# Patient Record
Sex: Male | Born: 1975
Health system: Southern US, Community
[De-identification: ages and names within clinical notes are randomized; demographics above are authoritative.]

## PROBLEM LIST (undated history)

## (undated) DIAGNOSIS — G4730 Sleep apnea, unspecified: Secondary | ICD-10-CM

## (undated) DIAGNOSIS — J189 Pneumonia, unspecified organism: Secondary | ICD-10-CM

## (undated) DIAGNOSIS — Z8719 Personal history of other diseases of the digestive system: Secondary | ICD-10-CM

## (undated) DIAGNOSIS — F32A Depression, unspecified: Secondary | ICD-10-CM

## (undated) DIAGNOSIS — F4310 Post-traumatic stress disorder, unspecified: Secondary | ICD-10-CM

## (undated) DIAGNOSIS — I10 Essential (primary) hypertension: Secondary | ICD-10-CM

## (undated) DIAGNOSIS — F329 Major depressive disorder, single episode, unspecified: Secondary | ICD-10-CM

## (undated) DIAGNOSIS — M4316 Spondylolisthesis, lumbar region: Secondary | ICD-10-CM

## (undated) DIAGNOSIS — A770 Spotted fever due to Rickettsia rickettsii: Secondary | ICD-10-CM

## (undated) DIAGNOSIS — Z87442 Personal history of urinary calculi: Secondary | ICD-10-CM

## (undated) DIAGNOSIS — J45909 Unspecified asthma, uncomplicated: Secondary | ICD-10-CM

## (undated) DIAGNOSIS — M1990 Unspecified osteoarthritis, unspecified site: Secondary | ICD-10-CM

## (undated) HISTORY — PX: HERNIA REPAIR: SHX51

## (undated) HISTORY — PX: SPLENECTOMY, TOTAL: SHX788

## (undated) HISTORY — PX: OTHER SURGICAL HISTORY: SHX169

## (undated) HISTORY — PX: MANDIBLE SURGERY: SHX707

---

## 2004-12-07 ENCOUNTER — Emergency Department (HOSPITAL_COMMUNITY): Admission: EM | Admit: 2004-12-07 | Discharge: 2004-12-07 | Payer: Self-pay | Admitting: *Deleted

## 2008-10-06 ENCOUNTER — Emergency Department (HOSPITAL_COMMUNITY): Admission: EM | Admit: 2008-10-06 | Discharge: 2008-10-06 | Payer: Self-pay | Admitting: Emergency Medicine

## 2009-05-06 ENCOUNTER — Ambulatory Visit: Payer: Self-pay | Admitting: Cardiology

## 2010-05-06 ENCOUNTER — Emergency Department (HOSPITAL_COMMUNITY): Admission: EM | Admit: 2010-05-06 | Discharge: 2010-05-06 | Payer: Self-pay | Admitting: Emergency Medicine

## 2010-06-05 ENCOUNTER — Emergency Department (HOSPITAL_COMMUNITY)
Admission: EM | Admit: 2010-06-05 | Discharge: 2010-06-06 | Payer: Self-pay | Source: Home / Self Care | Admitting: Emergency Medicine

## 2010-08-29 ENCOUNTER — Emergency Department (HOSPITAL_COMMUNITY): Payer: Self-pay

## 2010-08-29 ENCOUNTER — Emergency Department (HOSPITAL_COMMUNITY)
Admission: EM | Admit: 2010-08-29 | Discharge: 2010-08-29 | Disposition: A | Discharge status: Home / Self Care | Payer: Self-pay | Attending: Emergency Medicine | Admitting: Emergency Medicine

## 2010-08-29 DIAGNOSIS — J40 Bronchitis, not specified as acute or chronic: Secondary | ICD-10-CM | POA: Insufficient documentation

## 2010-08-29 DIAGNOSIS — R079 Chest pain, unspecified: Secondary | ICD-10-CM | POA: Insufficient documentation

## 2010-08-29 LAB — POCT CARDIAC MARKERS
CKMB, poc: <1 ng/mL — ABNORMAL LOW (ref 1.0–8.0)
Troponin i, poc: <0.05 ng/mL (ref 0.00–0.09)
Troponin i, poc: <0.05 ng/mL (ref 0.00–0.09)

## 2010-08-29 LAB — CBC
Hemoglobin: 15.5 g/dL (ref 13.0–17.0)
MCH: 29 pg (ref 26.0–34.0)
MCV: 81.7 fL (ref 78.0–100.0)
Platelets: 406 10*3/uL — ABNORMAL HIGH (ref 150–400)
RBC: 5.35 MIL/uL (ref 4.22–5.81)
WBC: 15.2 10*3/uL — ABNORMAL HIGH (ref 4.0–10.5)

## 2010-08-29 LAB — BASIC METABOLIC PANEL
CO2: 28 mEq/L (ref 19–32)
Chloride: 103 mEq/L (ref 96–112)
GFR calc Af Amer: >60 mL/min (ref >60)
Potassium: 3.8 mEq/L (ref 3.5–5.1)
Sodium: 140 mEq/L (ref 135–145)

## 2010-08-29 LAB — DIFFERENTIAL
Basophils Absolute: 0.2 10*3/uL — ABNORMAL HIGH (ref 0.0–0.1)
Basophils Relative: 1 % (ref 0–1)
Lymphocytes Relative: 49 % — ABNORMAL HIGH (ref 12–46)
Monocytes Relative: 8 % (ref 3–12)
Neutro Abs: 5.5 10*3/uL (ref 1.7–7.7)
Neutrophils Relative %: 36 % — ABNORMAL LOW (ref 43–77)

## 2010-08-29 LAB — D-DIMER, QUANTITATIVE: D-Dimer, Quant: 0.26 ug/mL-FEU (ref 0.00–0.48)

## 2010-09-09 LAB — DIFFERENTIAL
Basophils Absolute: 0.2 10*3/uL — ABNORMAL HIGH (ref 0.0–0.1)
Basophils Relative: 1 % (ref 0–1)
Lymphocytes Relative: 44 % (ref 12–46)
Neutro Abs: 5.7 10*3/uL (ref 1.7–7.7)
Neutrophils Relative %: 40 % — ABNORMAL LOW (ref 43–77)

## 2010-09-09 LAB — BASIC METABOLIC PANEL WITH GFR
BUN: 11 mg/dL (ref 6–23)
CO2: 28 meq/L (ref 19–32)
Calcium: 9.2 mg/dL (ref 8.4–10.5)
Chloride: 102 meq/L (ref 96–112)
Creatinine, Ser: 0.84 mg/dL (ref 0.4–1.5)
GFR calc non Af Amer: >60 mL/min
Glucose, Bld: 107 mg/dL — ABNORMAL HIGH (ref 70–99)
Potassium: 3.8 meq/L (ref 3.5–5.1)
Sodium: 139 meq/L (ref 135–145)

## 2010-09-09 LAB — CBC
HCT: 42.6 % (ref 39.0–52.0)
Hemoglobin: 15.9 g/dL (ref 13.0–17.0)
MCH: 29.8 pg (ref 26.0–34.0)
MCHC: 37.3 g/dL — ABNORMAL HIGH (ref 30.0–36.0)
MCV: 79.8 fL (ref 78.0–100.0)
Platelets: 369 K/uL (ref 150–400)
RBC: 5.34 MIL/uL (ref 4.22–5.81)
RDW: 13.8 % (ref 11.5–15.5)
WBC: 14.1 K/uL — ABNORMAL HIGH (ref 4.0–10.5)

## 2010-09-09 LAB — POCT CARDIAC MARKERS
CKMB, poc: <1 ng/mL — ABNORMAL LOW (ref 1.0–8.0)
CKMB, poc: <1 ng/mL — ABNORMAL LOW (ref 1.0–8.0)
Myoglobin, poc: 41.7 ng/mL (ref 12–200)
Myoglobin, poc: 62.2 ng/mL (ref 12–200)
Troponin i, poc: <0.05 ng/mL (ref 0.00–0.09)
Troponin i, poc: <0.05 ng/mL (ref 0.00–0.09)

## 2010-09-10 LAB — DIFFERENTIAL
Basophils Absolute: 0.2 10*3/uL — ABNORMAL HIGH (ref 0.0–0.1)
Eosinophils Absolute: 1 10*3/uL — ABNORMAL HIGH (ref 0.0–0.7)
Lymphocytes Relative: 50 % — ABNORMAL HIGH (ref 12–46)
Lymphs Abs: 8.5 10*3/uL — ABNORMAL HIGH (ref 0.7–4.0)
Neutro Abs: 5.6 10*3/uL (ref 1.7–7.7)

## 2010-09-10 LAB — CBC
HCT: 47.4 % (ref 39.0–52.0)
Hemoglobin: 15.9 g/dL (ref 13.0–17.0)
MCH: 28.9 pg (ref 26.0–34.0)
MCHC: 33.5 g/dL (ref 30.0–36.0)
MCV: 86.5 fL (ref 78.0–100.0)
RBC: 5.48 MIL/uL (ref 4.22–5.81)

## 2010-09-10 LAB — POCT CARDIAC MARKERS
Myoglobin, poc: 34.8 ng/mL (ref 12–200)
Troponin i, poc: <0.05 ng/mL (ref 0.00–0.09)

## 2010-09-10 LAB — COMPREHENSIVE METABOLIC PANEL
AST: 39 U/L — ABNORMAL HIGH (ref 0–37)
CO2: 25 mEq/L (ref 19–32)
Chloride: 104 mEq/L (ref 96–112)
Creatinine, Ser: 0.8 mg/dL (ref 0.4–1.5)
GFR calc Af Amer: >60 mL/min (ref >60)
GFR calc non Af Amer: >60 mL/min (ref >60)
Glucose, Bld: 138 mg/dL — ABNORMAL HIGH (ref 70–99)
Total Bilirubin: 0.5 mg/dL (ref 0.3–1.2)

## 2012-08-26 ENCOUNTER — Emergency Department (HOSPITAL_COMMUNITY): Payer: 59

## 2012-08-26 ENCOUNTER — Emergency Department (HOSPITAL_COMMUNITY)
Admission: EM | Admit: 2012-08-26 | Discharge: 2012-08-26 | Disposition: A | Discharge status: Home / Self Care | Payer: 59 | Attending: Emergency Medicine | Admitting: Emergency Medicine

## 2012-08-26 ENCOUNTER — Encounter (HOSPITAL_COMMUNITY): Payer: Self-pay | Admitting: Emergency Medicine

## 2012-08-26 DIAGNOSIS — R110 Nausea: Secondary | ICD-10-CM | POA: Insufficient documentation

## 2012-08-26 DIAGNOSIS — N200 Calculus of kidney: Secondary | ICD-10-CM

## 2012-08-26 DIAGNOSIS — N509 Disorder of male genital organs, unspecified: Secondary | ICD-10-CM | POA: Insufficient documentation

## 2012-08-26 LAB — URINALYSIS, ROUTINE W REFLEX MICROSCOPIC
Bilirubin Urine: NEGATIVE
Nitrite: NEGATIVE
Specific Gravity, Urine: 1.01 (ref 1.005–1.030)
pH: 6 (ref 5.0–8.0)

## 2012-08-26 LAB — URINE MICROSCOPIC-ADD ON

## 2012-08-26 MED ORDER — MORPHINE SULFATE 4 MG/ML IJ SOLN
6.0000 mg | Freq: Once | INTRAMUSCULAR | Status: AC
  Administered 2012-08-26: 6 mg via INTRAVENOUS
  Filled 2012-08-26: qty 2

## 2012-08-26 MED ORDER — KETOROLAC TROMETHAMINE 30 MG/ML IJ SOLN
30.0000 mg | Freq: Once | INTRAMUSCULAR | Status: AC
  Administered 2012-08-26: 30 mg via INTRAVENOUS
  Filled 2012-08-26: qty 1

## 2012-08-26 MED ORDER — HYDROCODONE-ACETAMINOPHEN 5-325 MG PO TABS: 1.0000 | ORAL_TABLET | ORAL | Status: DC | PRN

## 2012-08-26 NOTE — ED Notes (Signed)
Pt c/o rt testicle pain and lower abd pain. Pt denies any injury.

## 2012-08-26 NOTE — ED Notes (Signed)
Pt states a sharp pain in his right testicle woke him up at roughly 2300. The pain has since moved to his lower abdomen. Also c/o nausea. Reports taking 400 mg of Ibuprofen with no relief. States he was helping someone move a day or so ago. Denies any trauma or injury. No other complaints at this time.

## 2012-08-26 NOTE — ED Notes (Signed)
Pt states he feels as though he passed a kidney stone when he voided.

## 2012-08-26 NOTE — ED Provider Notes (Signed)
History     CSN: [id number]  Arrival date & time 08/26/12  0054   First MD Initiated Contact with Patient 08/26/12 0131      Chief Complaint  Patient presents with  . Abdominal Pain  . Groin Pain     The history is provided by the patient.  patient reports acute onset right testicular groin pain.  Nausea without vomiting.  No prior history kidney stones.  He denies radiation of this pain up to his right flank.  He reports the pain radiates from his right testicle into his right groin.  No prior history of hernia.  No urinary symptoms.  No testicular tenderness.  No fevers or chills.  No diarrhea.  Symptoms are moderate in severity.  Nothing worsens or improves his symptoms.  He's never had pain like this before  History reviewed. No pertinent past medical history.  Past Surgical History  Procedure Laterality Date  . Hernia repair    . Splenectomy, total      History reviewed. No pertinent family history.  History  Substance Use Topics  . Smoking status: Never Smoker   . Smokeless tobacco: Not on file  . Alcohol Use: Yes      Review of Systems  Gastrointestinal: Positive for abdominal pain.  All other systems reviewed and are negative.    Allergies  Review of patient's allergies indicates no known allergies.  Home Medications   Current Outpatient Rx  Name  Route  Sig  Dispense  Refill  . acetaminophen (TYLENOL) 500 MG tablet   Oral   Take 500 mg by mouth every 6 (six) hours as needed for pain.         Marland Kitchen HYDROcodone-acetaminophen (NORCO/VICODIN) 5-325 MG per tablet   Oral   Take 1 tablet by mouth every 4 (four) hours as needed for pain.   15 tablet   0     BP 130/88  Pulse 77  Temp(Src) 97.7 F (36.5 C) (Oral)  Resp 22  Ht 5\' 9"  (1.753 m)  Wt 330 lb (149.687 kg)  BMI 48.71 kg/m2  SpO2 97%  Physical Exam  Nursing note and vitals reviewed. Constitutional: He is oriented to person, place, and time. He appears well-developed and well-nourished.   HENT:  Head: Normocephalic and atraumatic.  Eyes: EOM are normal.  Neck: Normal range of motion.  Cardiovascular: Normal rate, regular rhythm, normal heart sounds and intact distal pulses.   Pulmonary/Chest: Effort normal and breath sounds normal. No respiratory distress.  Abdominal: Soft. He exhibits no distension. There is no tenderness.  Genitourinary:  Normal penis.  No penile tenderness.  Normal scrotum.  Normal testicles bilaterally.  No testicular tenderness.  Musculoskeletal: Normal range of motion.  Neurological: He is alert and oriented to person, place, and time.  Skin: Skin is warm and dry.  Psychiatric: He has a normal mood and affect. Judgment normal.    ED Course  Procedures (including critical care time)  Labs Reviewed  URINALYSIS, ROUTINE W REFLEX MICROSCOPIC - Abnormal; Notable for the following:    Hgb urine dipstick MODERATE (*)    Urobilinogen, UA 2.0 (*)    All other components within normal limits  URINE MICROSCOPIC-ADD ON   Ct Abdomen Pelvis Wo Contrast  08/26/2012  *RADIOLOGY REPORT*  Clinical Data: Right testicular pain and lower abdominal pain.  CT ABDOMEN AND PELVIS WITHOUT CONTRAST  Technique:  Multidetector CT imaging of the abdomen and pelvis was performed following the standard protocol without intravenous contrast.  Comparison:  None.  Findings: The lung bases are clear.  The right kidney is rotated and located inferiorly consistent with mild ectopia.  The kidneys are otherwise symmetrical in size and shape.  There is no pyelocaliectasis or ureterectasis.  No renal or ureteral stones are demonstrated.  There is a calcification at the base of the bladder which may be represent a stone in the prostatic urethra.  No bladder wall thickening.  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  Mesh placement over an anterior abdominal wall hernia.  There is mild residual bulging of the hernia containing transverse colon.  There is no  proximal obstruction.  The unenhanced appearance of the liver, pancreas, gallbladder, adrenal glands, abdominal aorta, and retroperitoneal lymph nodes is unremarkable.  The stomach, small bowel, and colon are decompressed.  Stool fills the colon. No free air or free fluid in the abdomen. Prominent visceral adipose tissues.  Pelvis:  Prostate gland is not enlarged.  No free or loculated pelvic fluid collections.  No diverticulitis.  Normal appendix.  No abnormal pelvic lymphadenopathy.  Normal alignment of the lumbar vertebrae.  IMPRESSION: No renal or ureteral stone or obstruction.  There is a calcification in the base of the bladder at the level of the prostate gland which could represent a prostatic ureteral stone. Prior postoperative changes with mesh in the anterior abdominal wall.  There is residual bulging of the anterior abdominal wall containing transverse colon without obstruction.  Previous splenectomy with two accessory spleens present.   Original Report Authenticated By: Burman Nieves, M.D.    I personally reviewed the imaging tests through PACS system I reviewed available ER/hospitalization records through the EMR   1. Kidney stone       MDM  3:40 AM The patient feels much better at this time.  He states when he gave his urine sample he noted a small stone in his urine.  This is consistent with now passed urethral stone. Home with urology follow        Lyanne Co, MD 08/26/12 [phone]

## 2013-07-18 ENCOUNTER — Emergency Department (HOSPITAL_COMMUNITY): Payer: 59

## 2013-07-18 ENCOUNTER — Emergency Department (HOSPITAL_COMMUNITY)
Admission: EM | Admit: 2013-07-18 | Discharge: 2013-07-18 | Disposition: A | Discharge status: Home / Self Care | Payer: 59 | Attending: Emergency Medicine | Admitting: Emergency Medicine

## 2013-07-18 ENCOUNTER — Encounter (HOSPITAL_COMMUNITY): Payer: Self-pay | Admitting: Emergency Medicine

## 2013-07-18 DIAGNOSIS — M542 Cervicalgia: Secondary | ICD-10-CM | POA: Insufficient documentation

## 2013-07-18 DIAGNOSIS — R000 Tachycardia, unspecified: Secondary | ICD-10-CM | POA: Insufficient documentation

## 2013-07-18 DIAGNOSIS — B9789 Other viral agents as the cause of diseases classified elsewhere: Secondary | ICD-10-CM | POA: Insufficient documentation

## 2013-07-18 DIAGNOSIS — J029 Acute pharyngitis, unspecified: Secondary | ICD-10-CM | POA: Insufficient documentation

## 2013-07-18 DIAGNOSIS — M549 Dorsalgia, unspecified: Secondary | ICD-10-CM | POA: Insufficient documentation

## 2013-07-18 DIAGNOSIS — F17200 Nicotine dependence, unspecified, uncomplicated: Secondary | ICD-10-CM | POA: Insufficient documentation

## 2013-07-18 DIAGNOSIS — Z8659 Personal history of other mental and behavioral disorders: Secondary | ICD-10-CM | POA: Insufficient documentation

## 2013-07-18 DIAGNOSIS — B349 Viral infection, unspecified: Secondary | ICD-10-CM

## 2013-07-18 HISTORY — DX: Post-traumatic stress disorder, unspecified: F43.10

## 2013-07-18 LAB — CBC WITH DIFFERENTIAL/PLATELET
BASOS ABS: 0.1 10*3/uL (ref 0.0–0.1)
BASOS PCT: 1 % (ref 0–1)
EOS ABS: 0.8 10*3/uL — AB (ref 0.0–0.7)
Eosinophils Relative: 5 % (ref 0–5)
HEMATOCRIT: 45.8 % (ref 39.0–52.0)
HEMOGLOBIN: 16 g/dL (ref 13.0–17.0)
Lymphocytes Relative: 15 % (ref 12–46)
Lymphs Abs: 2.6 10*3/uL (ref 0.7–4.0)
MCH: 28.5 pg (ref 26.0–34.0)
MCHC: 34.9 g/dL (ref 30.0–36.0)
MCV: 81.5 fL (ref 78.0–100.0)
MONO ABS: 2.9 10*3/uL — AB (ref 0.1–1.0)
MONOS PCT: 17 % — AB (ref 3–12)
NEUTROS PCT: 64 % (ref 43–77)
Neutro Abs: 11.3 10*3/uL — ABNORMAL HIGH (ref 1.7–7.7)
Platelets: 446 10*3/uL — ABNORMAL HIGH (ref 150–400)
RBC: 5.62 MIL/uL (ref 4.22–5.81)
RDW: 13.9 % (ref 11.5–15.5)
WBC: 17.8 10*3/uL — ABNORMAL HIGH (ref 4.0–10.5)

## 2013-07-18 LAB — INFLUENZA PANEL BY PCR (TYPE A & B)
H1N1FLUPCR: NOT DETECTED
INFLAPCR: NEGATIVE
INFLBPCR: NEGATIVE

## 2013-07-18 LAB — COMPREHENSIVE METABOLIC PANEL
ALBUMIN: 3.7 g/dL (ref 3.5–5.2)
ALT: 35 U/L (ref 0–53)
AST: 23 U/L (ref 0–37)
Alkaline Phosphatase: 68 U/L (ref 39–117)
BUN: 12 mg/dL (ref 6–23)
CO2: 30 mEq/L (ref 19–32)
CREATININE: 0.88 mg/dL (ref 0.50–1.35)
Calcium: 9.3 mg/dL (ref 8.4–10.5)
Chloride: 99 mEq/L (ref 96–112)
GFR calc Af Amer: >90 mL/min (ref >90)
GFR calc non Af Amer: >90 mL/min (ref >90)
Glucose, Bld: 100 mg/dL — ABNORMAL HIGH (ref 70–99)
Potassium: 4 mEq/L (ref 3.7–5.3)
Sodium: 140 mEq/L (ref 137–147)
TOTAL PROTEIN: 8 g/dL (ref 6.0–8.3)
Total Bilirubin: 0.3 mg/dL (ref 0.3–1.2)

## 2013-07-18 LAB — TROPONIN I

## 2013-07-18 LAB — RAPID STREP SCREEN (MED CTR MEBANE ONLY): STREPTOCOCCUS, GROUP A SCREEN (DIRECT): NEGATIVE

## 2013-07-18 LAB — CG4 I-STAT (LACTIC ACID): Lactic Acid, Venous: 1.67 mmol/L (ref 0.5–2.2)

## 2013-07-18 MED ORDER — ACETAMINOPHEN 325 MG PO TABS
650.0000 mg | ORAL_TABLET | Freq: Once | ORAL | Status: AC
  Administered 2013-07-18: 650 mg via ORAL
  Filled 2013-07-18: qty 2

## 2013-07-18 MED ORDER — OSELTAMIVIR PHOSPHATE 75 MG PO CAPS
75.0000 mg | ORAL_CAPSULE | Freq: Once | ORAL | Status: AC
  Administered 2013-07-18: 75 mg via ORAL
  Filled 2013-07-18: qty 1

## 2013-07-18 MED ORDER — SODIUM CHLORIDE 0.9 % IV BOLUS (SEPSIS)
1000.0000 mL | Freq: Once | INTRAVENOUS | Status: AC
  Administered 2013-07-18: 1000 mL via INTRAVENOUS

## 2013-07-18 MED ORDER — OSELTAMIVIR PHOSPHATE 75 MG PO CAPS: 75.0000 mg | ORAL_CAPSULE | Freq: Two times a day (BID) | ORAL | Status: DC

## 2013-07-18 NOTE — ED Provider Notes (Signed)
CSN: [id number]     Arrival date & time 07/18/13  4098 History   First MD Initiated Contact with Patient 07/18/13 1003     Chief Complaint  Patient presents with  . Influenza   (Consider location/radiation/quality/duration/timing/severity/associated sxs/prior Treatment) The history is provided by the patient. No language interpreter was used.  Sean Murillo is a 38 year old male with past medical history PTSD presenting to emergency department with shakes, generalized bodyaches, sore throat, nasal congestion and productive cough with yellowish phlegm that started this morning. Patient reports he woke up feeling feverish. Reports that most of the aching sensation is localized to his neck and back. Reports he's used nothing for the discomfort-denied using any Tylenol. Wife reported that patient was extremely sweaty this morning. Patient was used feeling fine yesterday he denied any symptoms. Reports he's been eating and drinking fine. Denied chest pain, short of breath, difficulty breathing, nausea, vomiting, diarrhea, abdominal pain, decrease to eating, urinary issues. Denies sick contacts. Reports that people at work are sick.  PCP Dr. Phillips Odor  Past Medical History  Diagnosis Date  . PTSD (post-traumatic stress disorder)    Past Surgical History  Procedure Laterality Date  . Hernia repair    . Splenectomy, total     No family history on file. History  Substance Use Topics  . Smoking status: Current Every Day Smoker  . Smokeless tobacco: Current User    Types: Chew  . Alcohol Use: Yes     Comment: occ    Review of Systems  Constitutional: Positive for fever (subjective). Negative for chills.  HENT: Positive for congestion and sore throat. Negative for trouble swallowing.   Respiratory: Positive for cough. Negative for chest tightness and shortness of breath.   Cardiovascular: Negative for chest pain.  Gastrointestinal: Negative for nausea, vomiting, abdominal pain and diarrhea.   Genitourinary: Negative for decreased urine volume.  Musculoskeletal: Positive for myalgias.  Neurological: Negative for dizziness and weakness.  All other systems reviewed and are negative.    Allergies  Review of patient's allergies indicates no known allergies.  Home Medications   Current Outpatient Rx  Name  Route  Sig  Dispense  Refill  . oseltamivir (TAMIFLU) 75 MG capsule   Oral   Take 1 capsule (75 mg total) by mouth every 12 (twelve) hours.   9 capsule   0    BP 117/80  Pulse 102  Temp(Src) 98.1 F (36.7 C) (Oral)  Resp 20  Ht 5\' 10"  (1.778 m)  Wt 300 lb (136.079 kg)  BMI 43.05 kg/m2  SpO2 95% Physical Exam  Nursing note and vitals reviewed. Constitutional: He is oriented to person, place, and time. He appears well-developed and well-nourished. No distress.  Patient incredibly upright in bed, not diaphoretic  HENT:  Head: Normocephalic and atraumatic.  Mouth/Throat: Oropharynx is clear and moist. No oropharyngeal exudate.  Mild erythema localized to the posterior oropharynx and tonsils bilaterally. Negative swelling, exudate, petechiae noted to posterior oropharynx and bilateral tonsils. Negative uvula swelling. Uvula midline, symmetrical elevation. Negative trismus.  Eyes: Conjunctivae and EOM are normal. Pupils are equal, round, and reactive to light. Right eye exhibits no discharge. Left eye exhibits no discharge.  Neck: Normal range of motion. Neck supple. No tracheal deviation present.  To get neck stiffness Negative nuchal rigidity Negative cervical lymphadenopathy Negative meningeal signs  Cardiovascular: Regular rhythm and normal heart sounds.    Mild tachycardia  Pulmonary/Chest: Effort normal and breath sounds normal. No respiratory distress. He has no wheezes. He  has no rales. He exhibits no tenderness.  Musculoskeletal: Normal range of motion.  Full ROM to upper and lower extremities without difficulty noted, negative ataxia noted.   Lymphadenopathy:    He has no cervical adenopathy.  Neurological: He is alert and oriented to person, place, and time. No cranial nerve deficit. He exhibits normal muscle tone. Coordination normal.  Cranial nerves III-XII grossly intact Strength 5+/5+ to upper and lower extremities bilaterally with resistance applied, equal distribution noted  Skin: Skin is warm and dry. No rash noted. He is not diaphoretic. No erythema.  Psychiatric: He has a normal mood and affect. His behavior is normal. Thought content normal.    ED Course  Procedures (including critical care time)  Results for orders placed during the hospital encounter of 07/18/13  RAPID STREP SCREEN      Result Value Range   Streptococcus, Group A Screen (Direct) NEGATIVE  NEGATIVE  CBC WITH DIFFERENTIAL      Result Value Range   WBC 17.8 (*) 4.0 - 10.5 K/uL   RBC 5.62  4.22 - 5.81 MIL/uL   Hemoglobin 16.0  13.0 - 17.0 g/dL   HCT 40.9  81.1 - 91.4 %   MCV 81.5  78.0 - 100.0 fL   MCH 28.5  26.0 - 34.0 pg   MCHC 34.9  30.0 - 36.0 g/dL   RDW 78.2  95.6 - 21.3 %   Platelets 446 (*) 150 - 400 K/uL   Neutrophils Relative % 64  43 - 77 %   Neutro Abs 11.3 (*) 1.7 - 7.7 K/uL   Lymphocytes Relative 15  12 - 46 %   Lymphs Abs 2.6  0.7 - 4.0 K/uL   Monocytes Relative 17 (*) 3 - 12 %   Monocytes Absolute 2.9 (*) 0.1 - 1.0 K/uL   Eosinophils Relative 5  0 - 5 %   Eosinophils Absolute 0.8 (*) 0.0 - 0.7 K/uL   Basophils Relative 1  0 - 1 %   Basophils Absolute 0.1  0.0 - 0.1 K/uL  COMPREHENSIVE METABOLIC PANEL      Result Value Range   Sodium 140  137 - 147 mEq/L   Potassium 4.0  3.7 - 5.3 mEq/L   Chloride 99  96 - 112 mEq/L   CO2 30  19 - 32 mEq/L   Glucose, Bld 100 (*) 70 - 99 mg/dL   BUN 12  6 - 23 mg/dL   Creatinine, Ser 0.86  0.50 - 1.35 mg/dL   Calcium 9.3  8.4 - 57.8 mg/dL   Total Protein 8.0  6.0 - 8.3 g/dL   Albumin 3.7  3.5 - 5.2 g/dL   AST 23  0 - 37 U/L   ALT 35  0 - 53 U/L   Alkaline Phosphatase 68  39 - 117  U/L   Total Bilirubin 0.3  0.3 - 1.2 mg/dL   GFR calc non Af Amer >90  >90 mL/min   GFR calc Af Amer >90  >90 mL/min  INFLUENZA PANEL BY PCR (TYPE A & B, H1N1)      Result Value Range   Influenza A By PCR NEGATIVE  NEGATIVE   Influenza B By PCR NEGATIVE  NEGATIVE   H1N1 flu by pcr NOT DETECTED  NOT DETECTED  TROPONIN I      Result Value Range   Troponin I <0.30  <0.30 ng/mL  CG4 I-STAT (LACTIC ACID)      Result Value Range   Lactic Acid, Venous  1.67  0.5 - 2.2 mmol/L   Dg Chest 2 View  07/18/2013   CLINICAL DATA:  Cough and fever.  Influenza.  EXAM: CHEST  2 VIEW  COMPARISON:  08/29/2010  FINDINGS: Heart size is stable. Both lungs are clear. No evidence of pleural effusion. No mass or lymphadenopathy identified.  IMPRESSION: Stable exam.  No active disease.   Electronically Signed   By: Myles RosenthalJohn  Stahl M.D.   On: 07/18/2013 11:11   Labs Review Labs Reviewed  CBC WITH DIFFERENTIAL - Abnormal; Notable for the following:    WBC 17.8 (*)    Platelets 446 (*)    Neutro Abs 11.3 (*)    Monocytes Relative 17 (*)    Monocytes Absolute 2.9 (*)    Eosinophils Absolute 0.8 (*)    All other components within normal limits  COMPREHENSIVE METABOLIC PANEL - Abnormal; Notable for the following:    Glucose, Bld 100 (*)    All other components within normal limits  RAPID STREP SCREEN  CULTURE, GROUP A STREP  INFLUENZA PANEL BY PCR (TYPE A & B, H1N1)  TROPONIN I  CG4 I-STAT (LACTIC ACID)   Imaging Review Dg Chest 2 View  07/18/2013   CLINICAL DATA:  Cough and fever.  Influenza.  EXAM: CHEST  2 VIEW  COMPARISON:  08/29/2010  FINDINGS: Heart size is stable. Both lungs are clear. No evidence of pleural effusion. No mass or lymphadenopathy identified.  IMPRESSION: Stable exam.  No active disease.   Electronically Signed   By: Myles RosenthalJohn  Stahl M.D.   On: 07/18/2013 11:11    EKG Interpretation    Date/Time:  Monday July 18 2013 11:36:49 EST Ventricular Rate:  114 PR Interval:  150 QRS  Duration: 96 QT Interval:  330 QTC Calculation: 454 R Axis:   66 Text Interpretation:  Sinus tachycardia Nonspecific ST abnormality Abnormal ECG When compared with ECG of 29-Aug-2010 20:14, No significant change was found Confirmed by WARD  DO, KRISTEN ([id number]) on 07/18/2013 1:07:24 PM            MDM   1. Viral syndrome    Medications  sodium chloride 0.9 % bolus 1,000 mL (0 mLs Intravenous Stopped 07/18/13 1321)  acetaminophen (TYLENOL) tablet 650 mg (650 mg Oral Given 07/18/13 1140)  oseltamivir (TAMIFLU) capsule 75 mg (75 mg Oral Given 07/18/13 1408)   Filed Vitals:   07/18/13 1003 07/18/13 1321 07/18/13 1408  BP: 149/102 117/80   Pulse: 120 102   Temp: 99.5 F (37.5 C) 98.8 F (37.1 C) 98.1 F (36.7 C)  TempSrc: Oral  Oral  Resp: 22 20   Height: 5\' 10"  (1.778 m)    Weight: 300 lb (136.079 kg)    SpO2: 94% 95%      Patient presenting to emergency department with productive cough, nasal congestion, shivering, myalgias, sore throat does been ongoing since this morning. Patient has not been using anything over-the-counter. Reports that people are sick at work. Alert and oriented. GCS 15. Heart rate noted to be mildly tachycardic, normal rhythm. Lungs clear to auscultation to upper lower lobes. Mild erythema noted to the posterior oropharynx, negative exudate or petechiae identified. Negative tonsillar swelling. Negative uvula swelling-uvula midline, symmetrical elevation. Full range of motion to upper and lower extremities bilaterally without difficulty. Cap refill less than 3 seconds. Negative meningeal signs. EKG noted sinus tachycardia with a heart rate of 114 bpm - negative changes noted to the EKG, negative ischemic findings noted. Troponin negative elevation. Chest x-ray negative for  acute cardiopulmonary disease-negative findings for pneumonia. CBC noted elevated WBC of 17.8 with mild elevation of neutrophils of 11.3. CMP negative findings. Lactic acid negative elevation.  Strep test negative - culture pending. Influenza panel negative. Discussed case with Dr. Shelly Coss - transfer of care to Dr. Shelly Coss. Patient discharged by Dr. Adriana Simas, was discharged home with Tamiflu with strict return precautions given.   Raymon Mutton, PA-C 07/18/13 1743  Raymon Mutton, PA-C 07/18/13 1744

## 2013-07-18 NOTE — ED Notes (Signed)
Pt reports waking this am w// flu-like symptoms, has only taken halls.

## 2013-07-18 NOTE — Discharge Instructions (Signed)
Increase fluids.   Tylenol.   Tamiflu twice a day.  First dose given.   Return if worse

## 2013-07-19 NOTE — ED Provider Notes (Signed)
Medical screening examination/treatment/procedure(s) were performed by non-physician practitioner and as supervising physician I was immediately available for consultation/collaboration.  EKG Interpretation    Date/Time:  Monday July 18 2013 11:36:49 EST Ventricular Rate:  114 PR Interval:  150 QRS Duration: 96 QT Interval:  330 QTC Calculation: 454 R Axis:   66 Text Interpretation:  Sinus tachycardia Nonspecific ST abnormality Abnormal ECG When compared with ECG of 29-Aug-2010 20:14, No significant change was found Confirmed by WARD  DO, KRISTEN ([id number]) on 07/18/2013 1:07:24 PM              Layla MawKristen N Ward, DO 07/19/13 [id number]

## 2013-07-20 LAB — CULTURE, GROUP A STREP

## 2014-03-31 ENCOUNTER — Other Ambulatory Visit (HOSPITAL_COMMUNITY): Payer: Self-pay | Admitting: Physician Assistant

## 2014-03-31 DIAGNOSIS — R109 Unspecified abdominal pain: Secondary | ICD-10-CM

## 2014-04-03 ENCOUNTER — Encounter: Payer: Self-pay | Admitting: Neurology

## 2014-04-03 ENCOUNTER — Ambulatory Visit (INDEPENDENT_AMBULATORY_CARE_PROVIDER_SITE_OTHER): Payer: 59 | Admitting: Neurology

## 2014-04-03 ENCOUNTER — Ambulatory Visit (HOSPITAL_COMMUNITY): Payer: 59

## 2014-04-03 ENCOUNTER — Encounter (HOSPITAL_COMMUNITY): Payer: Self-pay | Admitting: Emergency Medicine

## 2014-04-03 ENCOUNTER — Inpatient Hospital Stay (HOSPITAL_COMMUNITY)
Admission: EM | Admit: 2014-04-03 | Discharge: 2014-04-05 | DRG: 389 | Disposition: A | Discharge status: Home / Self Care | Payer: 59 | Attending: Internal Medicine | Admitting: Internal Medicine

## 2014-04-03 ENCOUNTER — Emergency Department (HOSPITAL_COMMUNITY): Payer: 59

## 2014-04-03 VITALS — BP 149/101 | HR 82 | Temp 97.8°F | Resp 14 | Ht 69.25 in | Wt 318.5 lb

## 2014-04-03 DIAGNOSIS — E669 Obesity, unspecified: Secondary | ICD-10-CM

## 2014-04-03 DIAGNOSIS — Z8249 Family history of ischemic heart disease and other diseases of the circulatory system: Secondary | ICD-10-CM

## 2014-04-03 DIAGNOSIS — F17220 Nicotine dependence, chewing tobacco, uncomplicated: Secondary | ICD-10-CM | POA: Diagnosis present

## 2014-04-03 DIAGNOSIS — D72829 Elevated white blood cell count, unspecified: Secondary | ICD-10-CM | POA: Diagnosis present

## 2014-04-03 DIAGNOSIS — G4733 Obstructive sleep apnea (adult) (pediatric): Secondary | ICD-10-CM

## 2014-04-03 DIAGNOSIS — F4310 Post-traumatic stress disorder, unspecified: Secondary | ICD-10-CM | POA: Diagnosis present

## 2014-04-03 DIAGNOSIS — Z6841 Body Mass Index (BMI) 40.0 and over, adult: Secondary | ICD-10-CM | POA: Diagnosis present

## 2014-04-03 DIAGNOSIS — K56600 Partial intestinal obstruction, unspecified as to cause: Secondary | ICD-10-CM | POA: Diagnosis present

## 2014-04-03 DIAGNOSIS — G4719 Other hypersomnia: Secondary | ICD-10-CM

## 2014-04-03 DIAGNOSIS — Z833 Family history of diabetes mellitus: Secondary | ICD-10-CM

## 2014-04-03 DIAGNOSIS — Z823 Family history of stroke: Secondary | ICD-10-CM

## 2014-04-03 DIAGNOSIS — R1010 Upper abdominal pain, unspecified: Secondary | ICD-10-CM | POA: Diagnosis not present

## 2014-04-03 LAB — COMPREHENSIVE METABOLIC PANEL
ALBUMIN: 3.7 g/dL (ref 3.5–5.2)
ALK PHOS: 78 U/L (ref 39–117)
ALK PHOS: 74 U/L (ref 39–117)
ALT: 33 U/L (ref 0–53)
ALT: 32 U/L (ref 0–53)
ANION GAP: 11 (ref 5–15)
ANION GAP: 13 (ref 5–15)
AST: 22 U/L (ref 0–37)
AST: 20 U/L (ref 0–37)
Albumin: 4 g/dL (ref 3.5–5.2)
BUN: 15 mg/dL (ref 6–23)
BUN: 15 mg/dL (ref 6–23)
CO2: 30 mEq/L (ref 19–32)
CO2: 28 mEq/L (ref 19–32)
CREATININE: 0.82 mg/dL (ref 0.50–1.35)
Calcium: 9.7 mg/dL (ref 8.4–10.5)
Calcium: 9.4 mg/dL (ref 8.4–10.5)
Chloride: 99 mEq/L (ref 96–112)
Chloride: 99 mEq/L (ref 96–112)
Creatinine, Ser: 0.79 mg/dL (ref 0.50–1.35)
GFR calc non Af Amer: >90 mL/min (ref >90)
GFR calc non Af Amer: >90 mL/min (ref >90)
GLUCOSE: 95 mg/dL (ref 70–99)
GLUCOSE: 91 mg/dL (ref 70–99)
POTASSIUM: 4.4 meq/L (ref 3.7–5.3)
POTASSIUM: 4.3 meq/L (ref 3.7–5.3)
Sodium: 140 mEq/L (ref 137–147)
Sodium: 140 mEq/L (ref 137–147)
TOTAL PROTEIN: 8.5 g/dL — AB (ref 6.0–8.3)
Total Bilirubin: 0.4 mg/dL (ref 0.3–1.2)
Total Bilirubin: 0.4 mg/dL (ref 0.3–1.2)
Total Protein: 7.9 g/dL (ref 6.0–8.3)

## 2014-04-03 LAB — CBC WITH DIFFERENTIAL/PLATELET
BASOS ABS: 0.3 10*3/uL — AB (ref 0.0–0.1)
BASOS PCT: 1 % (ref 0–1)
BASOS PCT: 2 % — AB (ref 0–1)
Band Neutrophils: 0 % (ref 0–10)
Basophils Absolute: 0.2 10*3/uL — ABNORMAL HIGH (ref 0.0–0.1)
Blasts: 0 %
EOS ABS: 1.2 10*3/uL — AB (ref 0.0–0.7)
EOS PCT: 3 % (ref 0–5)
EOS PCT: 7 % — AB (ref 0–5)
Eosinophils Absolute: 0.5 10*3/uL (ref 0.0–0.7)
HCT: 44.3 % (ref 39.0–52.0)
HEMATOCRIT: 45.8 % (ref 39.0–52.0)
HEMOGLOBIN: 16.7 g/dL (ref 13.0–17.0)
HEMOGLOBIN: 16.2 g/dL (ref 13.0–17.0)
LYMPHS ABS: 6 10*3/uL — AB (ref 0.7–4.0)
LYMPHS ABS: 5.2 10*3/uL — AB (ref 0.7–4.0)
Lymphocytes Relative: 38 % (ref 12–46)
Lymphocytes Relative: 31 % (ref 12–46)
MCH: 30 pg (ref 26.0–34.0)
MCH: 30 pg (ref 26.0–34.0)
MCHC: 36.5 g/dL — ABNORMAL HIGH (ref 30.0–36.0)
MCHC: 36.6 g/dL — ABNORMAL HIGH (ref 30.0–36.0)
MCV: 82.2 fL (ref 78.0–100.0)
MCV: 82 fL (ref 78.0–100.0)
MONO ABS: 1.3 10*3/uL — AB (ref 0.1–1.0)
Metamyelocytes Relative: 0 %
Monocytes Absolute: 0.7 10*3/uL (ref 0.1–1.0)
Monocytes Relative: 8 % (ref 3–12)
Monocytes Relative: 4 % (ref 3–12)
Myelocytes: 0 %
NEUTROS ABS: 9.5 10*3/uL — AB (ref 1.7–7.7)
NEUTROS PCT: 50 % (ref 43–77)
NEUTROS PCT: 56 % (ref 43–77)
NRBC: 0 /100{WBCs}
Neutro Abs: 7.7 10*3/uL (ref 1.7–7.7)
Platelets: 424 10*3/uL — ABNORMAL HIGH (ref 150–400)
Platelets: 406 10*3/uL — ABNORMAL HIGH (ref 150–400)
Promyelocytes Absolute: 0 %
RBC: 5.57 MIL/uL (ref 4.22–5.81)
RBC: 5.4 MIL/uL (ref 4.22–5.81)
RDW: 13.7 % (ref 11.5–15.5)
RDW: 13.8 % (ref 11.5–15.5)
WBC: 15.7 10*3/uL — ABNORMAL HIGH (ref 4.0–10.5)
WBC: 16.9 10*3/uL — ABNORMAL HIGH (ref 4.0–10.5)

## 2014-04-03 LAB — LIPASE, BLOOD: LIPASE: 23 U/L (ref 11–59)

## 2014-04-03 MED ORDER — ONDANSETRON HCL 4 MG/2ML IJ SOLN
4.0000 mg | Freq: Once | INTRAMUSCULAR | Status: AC
  Administered 2014-04-03: 4 mg via INTRAVENOUS
  Filled 2014-04-03: qty 2

## 2014-04-03 MED ORDER — SODIUM CHLORIDE 0.9 % IV BOLUS (SEPSIS)
1000.0000 mL | Freq: Once | INTRAVENOUS | Status: AC
  Administered 2014-04-03: 1000 mL via INTRAVENOUS

## 2014-04-03 MED ORDER — HYDROMORPHONE HCL 1 MG/ML IJ SOLN
1.0000 mg | Freq: Once | INTRAMUSCULAR | Status: AC
  Administered 2014-04-03: 1 mg via INTRAVENOUS
  Filled 2014-04-03: qty 1

## 2014-04-03 NOTE — Progress Notes (Deleted)
Subjective:    Patient ID: Sean BenderMichael Murillo is a 38 y.o. male.  HPI {Common ambulatory SmartLinks:[id number]}  Review of Systems  Objective:  Neurologic Exam  Physical Exam  Assessment:   ***  Plan:   ***

## 2014-04-03 NOTE — ED Notes (Signed)
abd pain , had surgery for ?ventral hernia in ?May.  Has appt for ct scan on Friday.  Came here for cont pain.  No N/V

## 2014-04-03 NOTE — ED Notes (Signed)
Pt c/o abdominal pain for aprox 1 week.  Reports pain in upper quadrants, mostly in RUQ.  States pain in intermittent and is relieved by laying down.  Pt reports history of hernia repair and states pain feels exactly like his hernia.  Denies nausea or vomiting.

## 2014-04-03 NOTE — Patient Instructions (Signed)

## 2014-04-03 NOTE — ED Provider Notes (Signed)
CSN: [id number]     Arrival date & time 04/03/14  1701 History  This chart was scribed for Lyanne Co, MD by Gwenyth Ober, ED Scribe. This patient was seen in room APA12/APA12 and the patient's care was started at 10:20 PM.    Chief Complaint  Patient presents with  . Abdominal Pain   The history is provided by the patient. No language interpreter was used.   HPI Comments: Sean Murillo is a 38 y.o. male who presents to the Emergency Department complaining of intermittent, severe upper abdominal pain that started 6 days ago and can last for a few hours. Pt has history of hernia repair and splenectomy. He denies issue with BM, fever, chills, problems with urination, or changes in appetite. Pt went to doctor one week ago and was diagnosed for a sinus infection.   Past Medical History  Diagnosis Date  . PTSD (post-traumatic stress disorder)    Past Surgical History  Procedure Laterality Date  . Hernia repair    . Splenectomy, total     History reviewed. No pertinent family history. History  Substance Use Topics  . Smoking status: Never Smoker   . Smokeless tobacco: Current User    Types: Chew     Comment: working on it  . Alcohol Use: Yes     Comment: occ    Review of Systems  Constitutional: Negative for fever, chills and appetite change.  Gastrointestinal: Positive for abdominal pain.  Genitourinary: Negative for difficulty urinating.    10 Systems reviewed and all are negative for acute change except as noted in the HPI.  Allergies  Contrast media and Iohexol  Home Medications   Prior to Admission medications   Medication Sig Start Date End Date Taking? Authorizing Provider  amoxicillin (AMOXIL) 875 MG tablet Take 875 mg by mouth 2 (two) times daily. 10 day course starting on 03/27/2014   Yes Historical Provider, MD  guaiFENesin-codeine (ROBITUSSIN AC) 100-10 MG/5ML syrup Take 10 mLs by mouth 2 (two) times daily. 10 day course starting on 03/27/2014   Yes  Historical Provider, MD  ipratropium (ATROVENT) 0.06 % nasal spray Place 2 sprays into both nostrils 2 (two) times daily.   Yes Historical Provider, MD   BP 122/103  Pulse 80  Temp(Src) 98.1 F (36.7 C) (Oral)  Resp 18  Ht 5\' 9"  (1.753 m)  Wt 314 lb (142.429 kg)  BMI 46.35 kg/m2  SpO2 94% Physical Exam  Nursing note and vitals reviewed. Constitutional: He is oriented to person, place, and time. He appears well-developed and well-nourished.  HENT:  Head: Normocephalic and atraumatic.  Eyes: EOM are normal.  Neck: Normal range of motion.  Cardiovascular: Normal rate, regular rhythm, normal heart sounds and intact distal pulses.   Pulmonary/Chest: Effort normal and breath sounds normal. No respiratory distress.  Abdominal: Soft. He exhibits distension (mild). There is no tenderness. There is no rebound and no guarding.  Mild diffuse upper abdominal tenderness  Musculoskeletal: Normal range of motion.  Neurological: He is alert and oriented to person, place, and time.  Skin: Skin is warm and dry.  Psychiatric: He has a normal mood and affect. Judgment normal.    ED Course  Procedures (including critical care time)  DIAGNOSTIC STUDIES: Oxygen Saturation is 94% on RA, adequate by my interpretation.    COORDINATION OF CARE: 10:26 PM Discussed treatment plan with pt at bedside and pt agreed to plan.   Labs Review Labs Reviewed  CBC WITH DIFFERENTIAL - Abnormal; Notable  for the following:    WBC 15.7 (*)    MCHC 36.5 (*)    Platelets 424 (*)    Lymphs Abs 6.0 (*)    Monocytes Absolute 1.3 (*)    Basophils Absolute 0.2 (*)    All other components within normal limits  COMPREHENSIVE METABOLIC PANEL - Abnormal; Notable for the following:    Total Protein 8.5 (*)    All other components within normal limits  CBC WITH DIFFERENTIAL - Abnormal; Notable for the following:    WBC 16.9 (*)    MCHC 36.6 (*)    Platelets 406 (*)    Eosinophils Relative 7 (*)    Basophils Relative  2 (*)    Neutro Abs 9.5 (*)    Lymphs Abs 5.2 (*)    Eosinophils Absolute 1.2 (*)    Basophils Absolute 0.3 (*)    All other components within normal limits  COMPREHENSIVE METABOLIC PANEL  LIPASE, BLOOD    Imaging Review Ct Renal Stone Study  04/03/2014   CLINICAL DATA:  Initial encounter for abdominal pain lasting 1 week. Right upper quadrant pain. Patient states the pain is similar to his previous hernia.  EXAM: CT ABDOMEN AND PELVIS WITHOUT CONTRAST  TECHNIQUE: Multidetector CT imaging of the abdomen and pelvis was performed following the standard protocol without IV contrast.  COMPARISON:  CT abdomen with contrast 11/18/2013.  FINDINGS: The lung bases are clear without focal nodule, mass, or airspace disease. The heart size is normal. No significant pleural or pericardial effusion is present.  Mild diffuse fatty infiltration liver is again noted. The patient is status post splenectomy. A 3.2 cm splenule remains. The stomach, duodenum, and pancreas are within normal limits. The common bile duct and gallbladder are normal. The adrenal glands are normal bilaterally. The kidneys and ureters are within normal limits. Urinary bladder is unremarkable.  The patient is status post ventral hernia repair with mesh. No recurrent hernia is present. A loop of small bowel is associated. There is fat herniated through the defect with associated fluid. A portion of bowel extends into the hernia. There is some change in caliber suggesting partial obstruction. The rectus sigmoid colon is within normal limits. Remainder the colon is unremarkable. The appendix is not discretely visualized.  No significant intraperitoneal free fluid is present. There is no significant adenopathy.  The bone windows demonstrate bilateral L5 pars defects with slight anterolisthesis at L5-S1, unchanged.  IMPRESSION: 1. Recurrent hernia containing fat and a single loop of bowel with partial small bowel obstruction. 2. Interval ventral hernia  repair with mesh. 3. Status post splenectomy. 4. Fatty infiltration of the liver. 5. Bilateral L5 pars defects.   Electronically Signed   By: Gennette Pac M.D.   On: 04/03/2014 23:24  I personally reviewed the imaging tests through PACS system I reviewed available ER/hospitalization records through the EMR    EKG Interpretation None      MDM   Final diagnoses:  Partial small bowel obstruction   Patient ongoing pain and discomfort.  CT scan demonstrates recurrent abdominal hernia with fat and some small bowel.  Partial small bowel obstruction.  No significant abdominal distention this time.  Elevated white count.  Patient would benefit from observational admission overnight and likely general surgery consultation in the morning.  Pain improving in the ER   I personally performed the services described in this documentation, which was scribed in my presence. The recorded information has been reviewed and is accurate.  Lyanne CoKevin M Bobie Caris, MD 04/04/14 [phone]

## 2014-04-03 NOTE — Progress Notes (Signed)
Subjective:    Patient ID: Sean Murillo is a 38 y.o. male.  HPI    [name], MD, PhD [facility] [address], [address] [address] [place], [place] [address]  Dear Dr. Hassan Rowan,   I saw your patient, Sean Murillo, upon your kind request in my neurologic clinic today for initial consultation of his sleep disturbance, in particular, concern for underlying sleep apnea. The patient is unaccompanied today. As you know, Sean Murillo is a 38 year old right-handed gentleman with an underlying medical history of obesity and PTSD ([facility] [place] [facility]), who reports snoring and complains of daytime somnolence. He recently changed to day shift only. He is on Zoloft 100 mg daily.   His typical bedtime is reported to be around 7 to 8 PM and usual wake time is around 4 to 5 AM. Sleep onset typically occurs within minutes. He reports feeling adequately rested upon awakening. He wakes up on an average 1 times in the middle of the night and has to go to the bathroom 0 to 1 times on a typical night. He denies morning headaches.  He reports excessive daytime somnolence (EDS) and His Epworth Sleepiness Score (ESS) is 11/24 today. He has not fallen asleep while driving. The patient has not been taking a planned nap.  He has been known to snore for the past many years. Snoring is reportedly marked, and associated with choking sounds and witnessed apneas per daughter, who has witnessed sleep and issues with breathing (she is 15). The patient denies a sense of choking or strangling feeling. There is no report of nighttime reflux, with occasional nighttime cough experienced. The patient has not noted any RLS symptoms and is not known to kick while asleep or before falling asleep. There is family history of OSA in an uncle.    He denies cataplexy, sleep paralysis, hypnagogic or hypnopompic hallucinations, or sleep attacks. He does not report any vivid dreams, nightmares, dream  enactments, or parasomnias, such as sleep talking or sleep walking. The patient has not had a sleep study or a home sleep test.  He consumes 2 caffeinated beverages per day, usually in the form of 1 coffee and 1 sodas.  His bedroom is usually dark and cool.   His Past Medical History Is Significant For: Past Medical History  Diagnosis Date  . PTSD (post-traumatic stress disorder)     His Past Surgical History Is Significant For: Past Surgical History  Procedure Laterality Date  . Hernia repair    . Splenectomy, total      His Family History Is Significant For: No family history on file.  His Social History Is Significant For: History   Social History  . Marital Status: Married    Spouse Name: N/A    Number of Children: N/A  . Years of Education: N/A   Social History Main Topics  . Smoking status: Never Smoker   . Smokeless tobacco: Current User    Types: Chew     Comment: working on it  . Alcohol Use: Yes     Comment: occ  . Drug Use: No  . Sexual Activity: Yes   Other Topics Concern  . 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    His Allergies Are:  Allergies  Allergen Reactions  . Contrast Media [Iodinated Diagnostic Agents] Hives  :   His Current Medications Are:  Outpatient Encounter Prescriptions as of 04/03/2014  Medication Sig  . amoxicillin (AMOXIL) 875 MG tablet Take 875 mg by mouth 2 (two) times daily.  Marland Kitchen. guaiFENesin-codeine (ROBITUSSIN AC) 100-10 MG/5ML syrup Take 10 mLs by mouth 2 (two) times daily.  . [DISCONTINUED] oseltamivir (TAMIFLU) 75 MG capsule Take 1 capsule (75 mg total) by mouth every 12 (twelve) hours.  :  Review of Systems:  Out of a complete 14 point review of systems, all are reviewed and negative with the exception of these symptoms as listed below:  Review of Systems  Cough, wheezing, tinnitus, hearing loss, PTSD  Objective:  Neurologic Exam  Physical  Exam Physical Examination:   Filed Vitals:   04/03/14 0817  BP: 149/101  Pulse: 82  Temp: 97.8 F (36.6 C)  Resp: 14    General Examination: The patient is a very pleasant 38 y.o. male in no acute distress. He appears well-developed and well-nourished and adequately groomed.   HEENT: Normocephalic, atraumatic, pupils are equal, round and reactive to light and accommodation. Funduscopic exam is normal with sharp disc margins noted. Extraocular tracking is good without limitation to gaze excursion or nystagmus noted. Normal smooth pursuit is noted. Hearing is grossly intact. Tympanic membranes are clear bilaterally. Face is symmetric with normal facial animation and normal facial sensation. Speech is clear with no dysarthria noted. There is no hypophonia. There is no lip, neck/head, jaw or voice tremor. Neck is supple with full range of passive and active motion. There are no carotid bruits on auscultation. Oropharynx exam reveals: mild mouth dryness, adequate dental hygiene and marked airway crowding, due to large tonsils, larger uvula and thicker soft palate and fleshy tongue. Mallampati is class II. Tongue protrudes centrally and palate elevates symmetrically. Tonsils are 3+. Neck size is 18.5 inches. He has a minimal overbite. Nasal inspection reveals no significant nasal mucosal bogginess or redness and no septal deviation.   Chest: Clear to auscultation without wheezing, rhonchi or crackles noted.  Heart: S1+S2+0, regular and normal without murmurs, rubs or gallops noted.   Abdomen: Soft, non-tender and non-distended with normal bowel sounds appreciated on auscultation.  Extremities: There is no pitting edema in the distal lower extremities bilaterally. Pedal pulses are intact.  Skin: Warm and dry without trophic changes noted. There are no varicose veins.  Musculoskeletal: exam reveals no obvious joint deformities, tenderness or joint swelling or erythema.   Neurologically:  Mental  status: The patient is awake, alert and oriented in all 4 spheres. His immediate and remote memory, attention, language skills and fund of knowledge are appropriate. There is no evidence of aphasia, agnosia, apraxia or anomia. Speech is clear with normal prosody and enunciation. Thought process is linear. Mood is normal and affect is normal.  Cranial nerves II - XII are as described above under HEENT exam. In addition: shoulder shrug is normal with equal shoulder height noted. Motor exam: Normal bulk, strength and tone is noted. There is no drift, tremor or rebound. Romberg is negative. Reflexes are 2+ throughout. Babinski: Toes are flexor bilaterally. Fine motor skills and coordination: intact with normal finger taps, normal hand movements, normal rapid alternating patting, normal foot taps and normal foot agility.  Cerebellar testing: No dysmetria or intention tremor on finger to nose testing. Heel to shin is unremarkable bilaterally. There is no truncal or gait ataxia.  Sensory exam: intact to light touch, pinprick, vibration, temperature sense in the upper and lower extremities.  Gait, station and balance: He stands easily. No veering to one side is noted. No leaning to  one side is noted. Posture is age-appropriate and stance is narrow based. Gait shows normal stride length and normal pace. No problems turning are noted. He turns en bloc. Tandem walk is unremarkable. Intact toe and heel stance is noted.               Assessment and Plan:   In summary, Sean Murillo is a very pleasant 37 y.o.-year old male with a history of obesity and PTSD with a Hx and physical exam concerning for obstructive sleep apnea (OSA). I had a long chat with the patient about my findings and the diagnosis of OSA, its prognosis and treatment options. We talked about medical treatments, surgical interventions and non-pharmacological approaches. I explained in particular the risks and ramifications of untreated moderate to  severe OSA, especially with respect to developing cardiovascular disease down the Road, including congestive heart failure, difficult to treat hypertension, cardiac arrhythmias, or stroke. Even type 2 diabetes has, in part, been linked to untreated OSA. Symptoms of untreated OSA include daytime sleepiness, memory problems, mood irritability and mood disorder such as depression and anxiety, lack of energy, as well as recurrent headaches, especially morning headaches. We talked about trying to maintain a healthy lifestyle in general, as well as the importance of weight control. I encouraged the patient to eat healthy, exercise daily and keep well hydrated, to keep a scheduled bedtime and wake time routine, to not skip any meals and eat healthy snacks in between meals. I advised the patient not to drive when feeling sleepy. I recommended the following at this time: sleep study with potential positive airway pressure titration. (We will score hypopneas at 4% and split the sleep study into diagnostic and treatment portion, if the estimated. 2 hour AHI is >20/h).   I explained the sleep test procedure to the patient and also outlined possible surgical and non-surgical treatment options of OSA, including the use of a custom-made dental device (which would require a referral to a specialist dentist or oral surgeon), upper airway surgical options, such as pillar implants, radiofrequency surgery, tongue base surgery, and UPPP (which would involve a referral to an ENT surgeon). Rarely, jaw surgery such as mandibular advancement may be considered.  I also explained the CPAP treatment option to the patient, who indicated that he would be willing to try CPAP if the need arises. I explained the importance of being compliant with PAP treatment, not only for insurance purposes but primarily to improve His symptoms, and for the patient's long term health benefit, including to reduce His cardiovascular risks. I answered all his  questions today and the patient was in agreement. I would like to see him back after the sleep study is completed and encouraged him to call with any interim questions, concerns, problems or updates.   Thank you very much for allowing me to participate in the care of this nice patient. If I can be of any further assistance to you please do not hesitate to call me at [phone].  Sincerely,   [name], MD, PhD

## 2014-04-04 ENCOUNTER — Encounter (HOSPITAL_COMMUNITY): Payer: Self-pay | Admitting: Internal Medicine

## 2014-04-04 DIAGNOSIS — K56600 Partial intestinal obstruction, unspecified as to cause: Secondary | ICD-10-CM | POA: Diagnosis present

## 2014-04-04 DIAGNOSIS — Z8249 Family history of ischemic heart disease and other diseases of the circulatory system: Secondary | ICD-10-CM | POA: Diagnosis not present

## 2014-04-04 DIAGNOSIS — K5669 Other intestinal obstruction: Secondary | ICD-10-CM

## 2014-04-04 DIAGNOSIS — R1010 Upper abdominal pain, unspecified: Secondary | ICD-10-CM | POA: Diagnosis present

## 2014-04-04 DIAGNOSIS — Z6841 Body Mass Index (BMI) 40.0 and over, adult: Secondary | ICD-10-CM | POA: Diagnosis not present

## 2014-04-04 DIAGNOSIS — F17220 Nicotine dependence, chewing tobacco, uncomplicated: Secondary | ICD-10-CM | POA: Diagnosis present

## 2014-04-04 DIAGNOSIS — Z823 Family history of stroke: Secondary | ICD-10-CM | POA: Diagnosis not present

## 2014-04-04 DIAGNOSIS — D72829 Elevated white blood cell count, unspecified: Secondary | ICD-10-CM | POA: Diagnosis present

## 2014-04-04 DIAGNOSIS — F4310 Post-traumatic stress disorder, unspecified: Secondary | ICD-10-CM | POA: Diagnosis present

## 2014-04-04 DIAGNOSIS — Z833 Family history of diabetes mellitus: Secondary | ICD-10-CM | POA: Diagnosis not present

## 2014-04-04 LAB — COMPREHENSIVE METABOLIC PANEL
ALBUMIN: 3.3 g/dL — AB (ref 3.5–5.2)
ALK PHOS: 71 U/L (ref 39–117)
ALT: 29 U/L (ref 0–53)
ANION GAP: 9 (ref 5–15)
AST: 20 U/L (ref 0–37)
BUN: 15 mg/dL (ref 6–23)
CO2: 30 mEq/L (ref 19–32)
Calcium: 8.7 mg/dL (ref 8.4–10.5)
Chloride: 102 mEq/L (ref 96–112)
Creatinine, Ser: 0.88 mg/dL (ref 0.50–1.35)
GFR calc Af Amer: >90 mL/min (ref >90)
GFR calc non Af Amer: >90 mL/min (ref >90)
Glucose, Bld: 94 mg/dL (ref 70–99)
Potassium: 4.3 mEq/L (ref 3.7–5.3)
SODIUM: 141 meq/L (ref 137–147)
TOTAL PROTEIN: 7.3 g/dL (ref 6.0–8.3)
Total Bilirubin: 0.7 mg/dL (ref 0.3–1.2)

## 2014-04-04 LAB — CBC
HEMATOCRIT: 42.5 % (ref 39.0–52.0)
HEMOGLOBIN: 15 g/dL (ref 13.0–17.0)
MCH: 29.1 pg (ref 26.0–34.0)
MCHC: 35.3 g/dL (ref 30.0–36.0)
MCV: 82.5 fL (ref 78.0–100.0)
Platelets: 417 10*3/uL — ABNORMAL HIGH (ref 150–400)
RBC: 5.15 MIL/uL (ref 4.22–5.81)
RDW: 13.8 % (ref 11.5–15.5)
WBC: 13.3 10*3/uL — ABNORMAL HIGH (ref 4.0–10.5)

## 2014-04-04 MED ORDER — ONDANSETRON HCL 4 MG/2ML IJ SOLN: 4.0000 mg | Freq: Four times a day (QID) | INTRAMUSCULAR | Status: DC | PRN

## 2014-04-04 MED ORDER — ONDANSETRON HCL 4 MG PO TABS: 4.0000 mg | ORAL_TABLET | Freq: Four times a day (QID) | ORAL | Status: DC | PRN

## 2014-04-04 MED ORDER — PIPERACILLIN-TAZOBACTAM 3.375 G IVPB
3.3750 g | Freq: Three times a day (TID) | INTRAVENOUS | Status: DC
  Administered 2014-04-04: 3.375 g via INTRAVENOUS
  Filled 2014-04-04 (×4): qty 50

## 2014-04-04 MED ORDER — ACETAMINOPHEN 325 MG PO TABS: 650.0000 mg | ORAL_TABLET | Freq: Four times a day (QID) | ORAL | Status: DC | PRN

## 2014-04-04 MED ORDER — PIPERACILLIN-TAZOBACTAM 3.375 G IVPB
INTRAVENOUS | Status: AC
  Filled 2014-04-04: qty 50

## 2014-04-04 MED ORDER — AMOXICILLIN-POT CLAVULANATE 875-125 MG PO TABS
1.0000 | ORAL_TABLET | Freq: Two times a day (BID) | ORAL | Status: DC
  Administered 2014-04-04 – 2014-04-05 (×3): 1 via ORAL
  Filled 2014-04-04 (×3): qty 1

## 2014-04-04 MED ORDER — HEPARIN SODIUM (PORCINE) 5000 UNIT/ML IJ SOLN
5000.0000 [IU] | Freq: Three times a day (TID) | INTRAMUSCULAR | Status: DC
  Administered 2014-04-04 – 2014-04-05 (×5): 5000 [IU] via SUBCUTANEOUS
  Filled 2014-04-04 (×6): qty 1

## 2014-04-04 MED ORDER — IPRATROPIUM BROMIDE 0.03 % NA SOLN
2.0000 | Freq: Two times a day (BID) | NASAL | Status: DC
  Administered 2014-04-04: 2 via NASAL
  Filled 2014-04-04: qty 15

## 2014-04-04 MED ORDER — SODIUM CHLORIDE 0.9 % IV SOLN
INTRAVENOUS | Status: DC
  Administered 2014-04-04 (×2): via INTRAVENOUS

## 2014-04-04 MED ORDER — ALBUTEROL SULFATE (2.5 MG/3ML) 0.083% IN NEBU: 2.5000 mg | INHALATION_SOLUTION | RESPIRATORY_TRACT | Status: DC | PRN

## 2014-04-04 MED ORDER — HYDROMORPHONE HCL 1 MG/ML IJ SOLN: 1.0000 mg | INTRAMUSCULAR | Status: DC | PRN

## 2014-04-04 MED ORDER — ACETAMINOPHEN 650 MG RE SUPP: 650.0000 mg | Freq: Four times a day (QID) | RECTAL | Status: DC | PRN

## 2014-04-04 MED ORDER — PIPERACILLIN-TAZOBACTAM 3.375 G IVPB
3.3750 g | Freq: Once | INTRAVENOUS | Status: AC
Start: 2014-04-04 — End: 2014-04-04
  Administered 2014-04-04: 3.375 g via INTRAVENOUS
  Filled 2014-04-04: qty 50

## 2014-04-04 NOTE — Progress Notes (Signed)
ANTIBIOTIC CONSULT NOTE - INITIAL  Pharmacy Consult for Zosyn Indication: intra-abdominal infection  Allergies  Allergen Reactions  . Contrast Media [Iodinated Diagnostic Agents] Hives  . Iohexol Hives    Needs pre meds    Patient Measurements: Height: 5\' 9"  (175.3 cm) Weight: 314 lb (142.429 kg) IBW/kg (Calculated) : 70.7  Vital Signs: Temp: 97.9 F (36.6 C) (10/06 0127) Temp Source: Oral (10/06 0127) BP: 133/84 mmHg (10/06 0148) Pulse Rate: 67 (10/06 0045) Intake/Output from previous day:   Intake/Output from this shift:    Labs:  Recent Labs  04/03/14 1753 04/03/14 2249 04/04/14 0605  WBC 15.7* 16.9* 13.3*  HGB 16.7 16.2 15.0  PLT 424* 406* 417*  CREATININE 0.79 0.82 0.88   Estimated Creatinine Clearance: 163.2 ml/min (by C-G formula based on Cr of 0.88). No results found for this basename: VANCOTROUGH, VANCOPEAK, VANCORANDOM, GENTTROUGH, GENTPEAK, GENTRANDOM, TOBRATROUGH, TOBRAPEAK, TOBRARND, AMIKACINPEAK, AMIKACINTROU, AMIKACIN,  in the last 72 hours   Microbiology: No results found for this or any previous visit (from the past 720 hour(s)).  Medical History: Past Medical History  Diagnosis Date  . PTSD (post-traumatic stress disorder)     Medications:  Scheduled:  . heparin  5,000 Units Subcutaneous 3 times per day  . ipratropium  2 spray Each Nare BID  . piperacillin-tazobactam (ZOSYN)  IV  3.375 g Intravenous Q8H   Assessment: 38 yo obese M s/p ventral hernia repair several months ago who presented to ED with abdominal pain.  CT+ for partial small bowel obstruction.  Patient is also currently completing a 10-day course of amoxicillin  for a sinus infection.  He is afebrile with mildly elevated WBC.  Renal function is at patient's baseline.  Normalized CrCl >100 ml/min.  Zosyn 10/6>> Amoxicillin 9/28>>10/5   Goal of Therapy:  Eradicate infection.  Plan:  Zosyn 3.375gm IV Q8h to be infused over 4hrs Monitor renal function and cx data    Elson ClanLilliston, Jakaiden Fill Michelle 04/04/2014,7:44 AM

## 2014-04-04 NOTE — Progress Notes (Addendum)
[facility] PROGRESS NOTE  Sean BenderMichael Murillo UJW:[id number] DOB: 11/03/1975 DOA: 04/03/2014 PCP: Trinna PostKOBERLEIN, JUNELL CAROL, MD   Assessment/Plan: Partial small bowel obstruction Secondary to possibly recurrent hernia. Had hernial repair 3-4  months back. Symptoms resolved at present and tolerating soft diet started by surgery this am. No intervention needed. follows with surgeon in [place].  monitor with serial abdominal exam.  repeat abd xray not be needed if asymptomatic and tolerating diet..   Recent sinusitis  Augmentin as outpatient which is resumed   Morbid obesity  follow with PCP  Code Status: full code Family Communication: wife at bedside Disposition Plan: home possibly on 10/7 if imprving and tolerating advanced diet.    Consultants:  Dr Lovell SheehanJenkins  Procedures:  none  Antibiotics:  augmentin  HPI/Subjective: Patient seen and examined. Denies further abdominal pain, nausea or vomiting. Last bowel movement was yesterday morning.  Objective: Filed Vitals:   04/04/14 0148  BP: 133/84  Pulse:   Temp:   Resp:    No intake or output data in the 24 hours ending 04/04/14 1253 Filed Weights   04/03/14 1745  Weight: 142.429 kg (314 lb)    Exam:   General:  Middle aged obese male in no acute distress  HEENT: Moist oral mucosa  Chest: Clear to auscultation bilaterally  CVS: Normal S1-S2, no murmurs  Abdomen: Midline surgical scar, nondistended, nontender, bowel sounds present  Extremities: Warm, no edema  Data Reviewed: Basic Metabolic Panel:  Recent Labs Lab 04/03/14 1753 04/03/14 2249 04/04/14 0605  NA 140 140 141  K 4.4 4.3 4.3  CL 99 99 102  CO2 30 28 30   GLUCOSE 95 91 94  BUN 15 15 15   CREATININE 0.79 0.82 0.88  CALCIUM 9.7 9.4 8.7   Liver Function Tests:  Recent Labs Lab 04/03/14 1753 04/03/14 2249 04/04/14 0605  AST 22 20 20   ALT 33 32 29  ALKPHOS 78 74 71  BILITOT 0.4 0.4 0.7  PROT 8.5* 7.9 7.3  ALBUMIN 4.0 3.7 3.3*     Recent Labs Lab 04/03/14 2249  LIPASE 23   No results found for this basename: AMMONIA,  in the last 168 hours CBC:  Recent Labs Lab 04/03/14 1753 04/03/14 2249 04/04/14 0605  WBC 15.7* 16.9* 13.3*  NEUTROABS 7.7 9.5*  --   HGB 16.7 16.2 15.0  HCT 45.8 44.3 42.5  MCV 82.2 82.0 82.5  PLT 424* 406* 417*   Cardiac Enzymes: No results found for this basename: CKTOTAL, CKMB, CKMBINDEX, TROPONINI,  in the last 168 hours BNP (last 3 results) No results found for this basename: PROBNP,  in the last 8760 hours CBG: No results found for this basename: GLUCAP,  in the last 168 hours  No results found for this or any previous visit (from the past 240 hour(s)).   Studies: Ct Renal Stone Study  04/03/2014   CLINICAL DATA:  Initial encounter for abdominal pain lasting 1 week. Right upper quadrant pain. Patient states the pain is similar to his previous hernia.  EXAM: CT ABDOMEN AND PELVIS WITHOUT CONTRAST  TECHNIQUE: Multidetector CT imaging of the abdomen and pelvis was performed following the standard protocol without IV contrast.  COMPARISON:  CT abdomen with contrast 11/18/2013.  FINDINGS: The lung bases are clear without focal nodule, mass, or airspace disease. The heart size is normal. No significant pleural or pericardial effusion is present.  Mild diffuse fatty infiltration liver is again noted. The patient is status post splenectomy. A 3.2 cm splenule remains. The  stomach, duodenum, and pancreas are within normal limits. The common bile duct and gallbladder are normal. The adrenal glands are normal bilaterally. The kidneys and ureters are within normal limits. Urinary bladder is unremarkable.  The patient is status post ventral hernia repair with mesh. No recurrent hernia is present. A loop of small bowel is associated. There is fat herniated through the defect with associated fluid. A portion of bowel extends into the hernia. There is some change in caliber suggesting partial  obstruction. The rectus sigmoid colon is within normal limits. Remainder the colon is unremarkable. The appendix is not discretely visualized.  No significant intraperitoneal free fluid is present. There is no significant adenopathy.  The bone windows demonstrate bilateral L5 pars defects with slight anterolisthesis at L5-S1, unchanged.  IMPRESSION: 1. Recurrent hernia containing fat and a single loop of bowel with partial small bowel obstruction. 2. Interval ventral hernia repair with mesh. 3. Status post splenectomy. 4. Fatty infiltration of the liver. 5. Bilateral L5 pars defects.   Electronically Signed   By: Gennette Pac M.D.   On: 04/03/2014 23:24    Scheduled Meds: . heparin  5,000 Units Subcutaneous 3 times per day  . ipratropium  2 spray Each Nare BID  . piperacillin-tazobactam (ZOSYN)  IV  3.375 g Intravenous Q8H   Continuous Infusions: . sodium chloride 100 mL/hr at 04/04/14 0149      Time spent: 25 minutes    Judith Demps  [facility] Pager [phone]. If 7PM-7AM, please contact night-coverage at [email or web address], password [facility] 04/04/2014, 12:53 PM  LOS: 1 day

## 2014-04-04 NOTE — H&P (Signed)
[facility] History and Physical  [employer] OVZ:[id number] DOB: [birth year] DOA: 04/03/2014   PCP: Rocky Morel, MD  Specialists: Followed by Dr. Veatrice Bourbon. surgery in [facility]  Chief Complaint: Pain in the abdomen  HPI: Sean Murillo is a 39 y.o. male with a past medical history of recent ventral hernia repair done in May/June of this year at [facility]. Patient presents with ongoing upper abdominal pain since last week. He called his surgeon and a CT scan was scheduled for this Friday. Patient was asked to stay off work for a few days. He started feeling better and then he went to work yesterday, but the pain got worse. He denies any nausea, or vomiting. No fever, no chills. No dizziness or lightheadedness. Pain would increase with movement. The pain was 10 out of 10 in intensity. And, so, he decided to come into the hospital. After receiving pain medications his pain is down to 1/10 in intensity. He states that he is getting over a sinus infection and is currently on antibiotics. He denies any diarrhea. Has been passing gas. Had a normal bowel movement yesterday.  Home Medications: Prior to Admission medications   Medication Sig Start Date End Date Taking? Authorizing Provider  amoxicillin (AMOXIL) 875 MG tablet Take 875 mg by mouth 2 (two) times daily. 10 day course starting on 03/27/2014   Yes Historical Provider, MD  guaiFENesin-codeine (ROBITUSSIN AC) 100-10 MG/5ML syrup Take 10 mLs by mouth 2 (two) times daily. 10 day course starting on 03/27/2014   Yes Historical Provider, MD  ipratropium (ATROVENT) 0.06 % nasal spray Place 2 sprays into both nostrils 2 (two) times daily.   Yes Historical Provider, MD    Allergies:  Allergies  Allergen Reactions  . Contrast Media [Iodinated Diagnostic Agents] Hives  . Iohexol Hives    Needs pre meds    Past Medical History: Past Medical History  Diagnosis Date  . PTSD (post-traumatic stress  disorder)     Past Surgical History  Procedure Laterality Date  . Hernia repair    . Splenectomy, total      Social History: He lives in [place] with his wife. No smoking. Occasional alcohol use. No illicit drug use. Usually independent with daily activities  Family History:  Family History  Problem Relation Age of Onset  . Stroke Mother   There is history of, diabetes, hypertension, and cancer in the family  Review of Systems - History obtained from the patient General ROS: positive for  - fatigue Psychological ROS: negative Ophthalmic ROS: negative ENT ROS: negative Allergy and Immunology ROS: positive for - nasal congestion Hematological and Lymphatic ROS: negative Endocrine ROS: negative Respiratory ROS: no cough, shortness of breath, or wheezing Cardiovascular ROS: no chest pain or dyspnea on exertion Gastrointestinal ROS: as in hpi Genito-Urinary ROS: no dysuria, trouble voiding, or hematuria Musculoskeletal ROS: negative Neurological ROS: no TIA or stroke symptoms Dermatological ROS: negative  Physical Examination  Filed Vitals:   04/03/14 1745 04/03/14 2144  BP: 144/107 122/103  Pulse: 95 80  Temp: 98.1 F (36.7 C)   TempSrc: Oral   Resp: 18   Height: '5\' 9"'  (1.753 m)   Weight: 142.429 kg (314 lb)   SpO2: 98% 94%    BP 122/103  Pulse 80  Temp(Src) 98.1 F (36.7 C) (Oral)  Resp 18  Ht '5\' 9"'  (1.753 m)  Wt 142.429 kg (314 lb)  BMI 46.35 kg/m2  SpO2 94%  General appearance: alert, cooperative, appears stated  age and no distress Head: Normocephalic, without obvious abnormality, atraumatic Eyes: conjunctivae/corneas clear. PERRL, EOM's intact. Throat: lips, mucosa, and tongue normal; teeth and gums normal Resp: clear to auscultation bilaterally Cardio: regular rate and rhythm, S1, S2 normal, no murmur, click, rub or gallop GI: Abdomen is obese. Scar from recent surgical incision is noted. Tenderness is present over that area without any rebound,  rigidity, or guarding. Some swelling is appreciated. Bowel sounds are present. No organomegaly. Extremities: extremities normal, atraumatic, no cyanosis or edema Pulses: 2+ and symmetric Skin: Skin color, texture, turgor normal. No rashes or lesions Lymph nodes: Cervical, supraclavicular, and axillary nodes normal. Neurologic: No focal neurological deficits are present.  Laboratory Data: Results for orders placed during the hospital encounter of 04/03/14 (from the past 48 hour(s))  CBC WITH DIFFERENTIAL     Status: Abnormal   Collection Time    04/03/14  5:53 PM      Result Value Ref Range   WBC 15.7 (*) 4.0 - 10.5 K/uL   RBC 5.57  4.22 - 5.81 MIL/uL   Hemoglobin 16.7  13.0 - 17.0 g/dL   HCT 45.8  39.0 - 52.0 %   MCV 82.2  78.0 - 100.0 fL   MCH 30.0  26.0 - 34.0 pg   MCHC 36.5 (*) 30.0 - 36.0 g/dL   RDW 13.7  11.5 - 15.5 %   Platelets 424 (*) 150 - 400 K/uL   Neutrophils Relative % 50  43 - 77 %   Lymphocytes Relative 38  12 - 46 %   Monocytes Relative 8  3 - 12 %   Eosinophils Relative 3  0 - 5 %   Basophils Relative 1  0 - 1 %   Neutro Abs 7.7  1.7 - 7.7 K/uL   Lymphs Abs 6.0 (*) 0.7 - 4.0 K/uL   Monocytes Absolute 1.3 (*) 0.1 - 1.0 K/uL   Eosinophils Absolute 0.5  0.0 - 0.7 K/uL   Basophils Absolute 0.2 (*) 0.0 - 0.1 K/uL  COMPREHENSIVE METABOLIC PANEL     Status: Abnormal   Collection Time    04/03/14  5:53 PM      Result Value Ref Range   Sodium 140  137 - 147 mEq/L   Potassium 4.4  3.7 - 5.3 mEq/L   Chloride 99  96 - 112 mEq/L   CO2 30  19 - 32 mEq/L   Glucose, Bld 95  70 - 99 mg/dL   BUN 15  6 - 23 mg/dL   Creatinine, Ser 0.79  0.50 - 1.35 mg/dL   Calcium 9.7  8.4 - 10.5 mg/dL   Total Protein 8.5 (*) 6.0 - 8.3 g/dL   Albumin 4.0  3.5 - 5.2 g/dL   AST 22  0 - 37 U/L   ALT 33  0 - 53 U/L   Alkaline Phosphatase 78  39 - 117 U/L   Total Bilirubin 0.4  0.3 - 1.2 mg/dL   GFR calc non Af Amer >90  >90 mL/min   GFR calc Af Amer >90  >90 mL/min   Comment: (NOTE)      The eGFR has been calculated using the CKD EPI equation.     This calculation has not been validated in all clinical situations.     eGFR's persistently <90 mL/min signify possible Chronic Kidney     Disease.   Anion gap 11  5 - 15  CBC WITH DIFFERENTIAL     Status: Abnormal   Collection  Time    04/03/14 10:49 PM      Result Value Ref Range   WBC 16.9 (*) 4.0 - 10.5 K/uL   RBC 5.40  4.22 - 5.81 MIL/uL   Hemoglobin 16.2  13.0 - 17.0 g/dL   HCT 44.3  39.0 - 52.0 %   MCV 82.0  78.0 - 100.0 fL   MCH 30.0  26.0 - 34.0 pg   MCHC 36.6 (*) 30.0 - 36.0 g/dL   RDW 13.8  11.5 - 15.5 %   Platelets 406 (*) 150 - 400 K/uL   Neutrophils Relative % 56  43 - 77 %   Lymphocytes Relative 31  12 - 46 %   Monocytes Relative 4  3 - 12 %   Eosinophils Relative 7 (*) 0 - 5 %   Basophils Relative 2 (*) 0 - 1 %   Band Neutrophils 0  0 - 10 %   Metamyelocytes Relative 0     Myelocytes 0     Promyelocytes Absolute 0     Blasts 0     nRBC 0  0 /100 WBC   Neutro Abs 9.5 (*) 1.7 - 7.7 K/uL   Lymphs Abs 5.2 (*) 0.7 - 4.0 K/uL   Monocytes Absolute 0.7  0.1 - 1.0 K/uL   Eosinophils Absolute 1.2 (*) 0.0 - 0.7 K/uL   Basophils Absolute 0.3 (*) 0.0 - 0.1 K/uL   WBC Morphology ATYPICAL LYMPHOCYTES    COMPREHENSIVE METABOLIC PANEL     Status: None   Collection Time    04/03/14 10:49 PM      Result Value Ref Range   Sodium 140  137 - 147 mEq/L   Potassium 4.3  3.7 - 5.3 mEq/L   Chloride 99  96 - 112 mEq/L   CO2 28  19 - 32 mEq/L   Glucose, Bld 91  70 - 99 mg/dL   BUN 15  6 - 23 mg/dL   Creatinine, Ser 0.82  0.50 - 1.35 mg/dL   Calcium 9.4  8.4 - 10.5 mg/dL   Total Protein 7.9  6.0 - 8.3 g/dL   Albumin 3.7  3.5 - 5.2 g/dL   AST 20  0 - 37 U/L   ALT 32  0 - 53 U/L   Alkaline Phosphatase 74  39 - 117 U/L   Total Bilirubin 0.4  0.3 - 1.2 mg/dL   GFR calc non Af Amer >90  >90 mL/min   GFR calc Af Amer >90  >90 mL/min   Comment: (NOTE)     The eGFR has been calculated using the CKD EPI equation.     This  calculation has not been validated in all clinical situations.     eGFR's persistently <90 mL/min signify possible Chronic Kidney     Disease.   Anion gap 13  5 - 15  LIPASE, BLOOD     Status: None   Collection Time    04/03/14 10:49 PM      Result Value Ref Range   Lipase 23  11 - 59 U/L    Radiology Reports: Ct Renal Stone Study  04/03/2014   CLINICAL DATA:  Initial encounter for abdominal pain lasting 1 week. Right upper quadrant pain. Patient states the pain is similar to his previous hernia.  EXAM: CT ABDOMEN AND PELVIS WITHOUT CONTRAST  TECHNIQUE: Multidetector CT imaging of the abdomen and pelvis was performed following the standard protocol without IV contrast.  COMPARISON:  CT abdomen with contrast 11/18/2013.  FINDINGS: The lung bases are clear without focal nodule, mass, or airspace disease. The heart size is normal. No significant pleural or pericardial effusion is present.  Mild diffuse fatty infiltration liver is again noted. The patient is status post splenectomy. A 3.2 cm splenule remains. The stomach, duodenum, and pancreas are within normal limits. The common bile duct and gallbladder are normal. The adrenal glands are normal bilaterally. The kidneys and ureters are within normal limits. Urinary bladder is unremarkable.  The patient is status post ventral hernia repair with mesh. No recurrent hernia is present. A loop of small bowel is associated. There is fat herniated through the defect with associated fluid. A portion of bowel extends into the hernia. There is some change in caliber suggesting partial obstruction. The rectus sigmoid colon is within normal limits. Remainder the colon is unremarkable. The appendix is not discretely visualized.  No significant intraperitoneal free fluid is present. There is no significant adenopathy.  The bone windows demonstrate bilateral L5 pars defects with slight anterolisthesis at L5-S1, unchanged.  IMPRESSION: 1. Recurrent hernia containing fat  and a single loop of bowel with partial small bowel obstruction. 2. Interval ventral hernia repair with mesh. 3. Status post splenectomy. 4. Fatty infiltration of the liver. 5. Bilateral L5 pars defects.   Electronically Signed   By: Lawrence Santiago M.D.   On: 04/03/2014 23:24     Problem List  Principal Problem:   Partial small bowel obstruction Active Problems:   Leukocytosis   Assessment: This is a 38 year old, Caucasian male, who is obese, who had a recent hernia surgery this summer, who comes in with upper abdominal pain. CT scan suggests partial small bowel obstruction with a single bowel loop. There is some fluid around the hernia. He does not have any findings of peritonitis on examination.  Plan: #1 abdominal pain with a partial small bowel obstruction: He will be admitted to the hospital. He'll be placed on Zosyn. His surgeon in the [facility] is out of town, so we will have our general surgeon consult on this patient in the morning. Pain control will be provided. IV fluids. He'll be kept n.p.o.  #2 recent sinus infection: Could explain the leukocytosis. IV Zosyn as discussed earlier. Monitor for now. Follow WBC.   DVT Prophylaxis: Heparin Code Status: Full code Family Communication: Discussed with the patient and his wife  Disposition Plan: Admit to MedSurg   Further management decisions will depend on results of further testing and patient's response to treatment.   [facility]  [facility] Pager [phone]  If 7PM-7AM, please contact night-coverage [email or web address] Password [email or web address]  04/04/2014, 12:39 AM

## 2014-04-04 NOTE — Progress Notes (Signed)
ANTIBIOTIC CONSULT NOTE-Preliminary  Pharmacy Consult for Zosyn Indication: Intra-abdominal infection  Allergies  Allergen Reactions  . Contrast Media [Iodinated Diagnostic Agents] Hives  . Iohexol Hives    Needs pre meds    Patient Measurements: Height: 5\' 9"  (175.3 cm) Weight: 314 lb (142.429 kg) IBW/kg (Calculated) : 70.7   Vital Signs: Temp: 97.9 F (36.6 C) (10/06 0127) Temp Source: Oral (10/06 0127) BP: 133/84 mmHg (10/06 0148) Pulse Rate: 67 (10/06 0045)  Labs:  Recent Labs  04/03/14 1753 04/03/14 2249  WBC 15.7* 16.9*  HGB 16.7 16.2  PLT 424* 406*  CREATININE 0.79 0.82    Estimated Creatinine Clearance: 175.1 ml/min (by C-G formula based on Cr of 0.82).  No results found for this basename: VANCOTROUGH, VANCOPEAK, VANCORANDOM, GENTTROUGH, GENTPEAK, GENTRANDOM, TOBRATROUGH, TOBRAPEAK, TOBRARND, AMIKACINPEAK, AMIKACINTROU, AMIKACIN,  in the last 72 hours   Microbiology: No results found for this or any previous visit (from the past 720 hour(s)).  Medical History: Past Medical History  Diagnosis Date  . PTSD (post-traumatic stress disorder)     Medications:   Assessment: 38 yo male seen in the ED with abdominal pain x 1 week. Ct shows partial bowel obstruction. Pt has elevated WBCs; denies fever, chills or N/V. Pt reports recent sinus infection. Surgical consult pending in am.   Goal of Therapy:  Eradicate infection   Plan:  Preliminary review of pertinent patient information completed.  Protocol will be initiated with a one-time dose of Zosyn 3.375 Gm IV.  Jeani HawkingAnnie Penn clinical pharmacist will complete review during morning rounds to assess patient and finalize treatment regimen.  Arelia SneddonMason, Katharine Rochefort Anne, RPH 04/04/2014,1:50 AM .

## 2014-04-04 NOTE — Consult Note (Signed)
Reason for Consult: Small bowel obstruction Referring Physician: Hospitalist  Sean Murillo is an 38 y.o. male.  HPI: Patient is a 38 year old white male who underwent a ventral herniorrhaphy with mesh by Dr. Anthony Murillo in the evening in June 2015 who presented emergency room with worsening abdominal pain. CT scan the abdomen revealed a single loop of small bowel which appeared to be herniating through this appear aspect of the previous repair. He states that his abdominal pain worsens when he strains. Overnight, his abdominal pain has resolved. He states he has been moving his bowels. Denies any emesis.  Past Medical History  Diagnosis Date  . PTSD (post-traumatic stress disorder)     Past Surgical History  Procedure Laterality Date  . Hernia repair    . Splenectomy, total      Family History  Problem Relation Age of Onset  . Stroke Mother     Social History:  reports that he has never smoked. His smokeless tobacco use includes Chew. He reports that he drinks alcohol. He reports that he does not use illicit drugs.  Allergies:  Allergies  Allergen Reactions  . Contrast Media [Iodinated Diagnostic Agents] Hives  . Iohexol Hives    Needs pre meds    Medications: I have reviewed the patient's current medications.  Results for orders placed during the hospital encounter of 04/03/14 (from the past 48 hour(s))  CBC WITH DIFFERENTIAL     Status: Abnormal   Collection Time    04/03/14  5:53 PM      Result Value Ref Range   WBC 15.7 (*) 4.0 - 10.5 K/uL   RBC 5.57  4.22 - 5.81 MIL/uL   Hemoglobin 16.7  13.0 - 17.0 g/dL   HCT 45.8  39.0 - 52.0 %   MCV 82.2  78.0 - 100.0 fL   MCH 30.0  26.0 - 34.0 pg   MCHC 36.5 (*) 30.0 - 36.0 g/dL   RDW 13.7  11.5 - 15.5 %   Platelets 424 (*) 150 - 400 K/uL   Neutrophils Relative % 50  43 - 77 %   Lymphocytes Relative 38  12 - 46 %   Monocytes Relative 8  3 - 12 %   Eosinophils Relative 3  0 - 5 %   Basophils Relative 1  0 - 1 %   Neutro  Abs 7.7  1.7 - 7.7 K/uL   Lymphs Abs 6.0 (*) 0.7 - 4.0 K/uL   Monocytes Absolute 1.3 (*) 0.1 - 1.0 K/uL   Eosinophils Absolute 0.5  0.0 - 0.7 K/uL   Basophils Absolute 0.2 (*) 0.0 - 0.1 K/uL  COMPREHENSIVE METABOLIC PANEL     Status: Abnormal   Collection Time    04/03/14  5:53 PM      Result Value Ref Range   Sodium 140  137 - 147 mEq/L   Potassium 4.4  3.7 - 5.3 mEq/L   Chloride 99  96 - 112 mEq/L   CO2 30  19 - 32 mEq/L   Glucose, Bld 95  70 - 99 mg/dL   BUN 15  6 - 23 mg/dL   Creatinine, Ser 0.79  0.50 - 1.35 mg/dL   Calcium 9.7  8.4 - 10.5 mg/dL   Total Protein 8.5 (*) 6.0 - 8.3 g/dL   Albumin 4.0  3.5 - 5.2 g/dL   AST 22  0 - 37 U/L   ALT 33  0 - 53 U/L   Alkaline Phosphatase 78  39 - 117  U/L   Total Bilirubin 0.4  0.3 - 1.2 mg/dL   GFR calc non Af Amer >90  >90 mL/min   GFR calc Af Amer >90  >90 mL/min   Comment: (NOTE)     The eGFR has been calculated using the CKD EPI equation.     This calculation has not been validated in all clinical situations.     eGFR's persistently <90 mL/min signify possible Chronic Kidney     Disease.   Anion gap 11  5 - 15  CBC WITH DIFFERENTIAL     Status: Abnormal   Collection Time    04/03/14 10:49 PM      Result Value Ref Range   WBC 16.9 (*) 4.0 - 10.5 K/uL   RBC 5.40  4.22 - 5.81 MIL/uL   Hemoglobin 16.2  13.0 - 17.0 g/dL   HCT 44.3  39.0 - 52.0 %   MCV 82.0  78.0 - 100.0 fL   MCH 30.0  26.0 - 34.0 pg   MCHC 36.6 (*) 30.0 - 36.0 g/dL   RDW 13.8  11.5 - 15.5 %   Platelets 406 (*) 150 - 400 K/uL   Neutrophils Relative % 56  43 - 77 %   Lymphocytes Relative 31  12 - 46 %   Monocytes Relative 4  3 - 12 %   Eosinophils Relative 7 (*) 0 - 5 %   Basophils Relative 2 (*) 0 - 1 %   Band Neutrophils 0  0 - 10 %   Metamyelocytes Relative 0     Myelocytes 0     Promyelocytes Absolute 0     Blasts 0     nRBC 0  0 /100 WBC   Neutro Abs 9.5 (*) 1.7 - 7.7 K/uL   Lymphs Abs 5.2 (*) 0.7 - 4.0 K/uL   Monocytes Absolute 0.7  0.1 - 1.0  K/uL   Eosinophils Absolute 1.2 (*) 0.0 - 0.7 K/uL   Basophils Absolute 0.3 (*) 0.0 - 0.1 K/uL   WBC Morphology ATYPICAL LYMPHOCYTES    COMPREHENSIVE METABOLIC PANEL     Status: None   Collection Time    04/03/14 10:49 PM      Result Value Ref Range   Sodium 140  137 - 147 mEq/L   Potassium 4.3  3.7 - 5.3 mEq/L   Chloride 99  96 - 112 mEq/L   CO2 28  19 - 32 mEq/L   Glucose, Bld 91  70 - 99 mg/dL   BUN 15  6 - 23 mg/dL   Creatinine, Ser 0.82  0.50 - 1.35 mg/dL   Calcium 9.4  8.4 - 10.5 mg/dL   Total Protein 7.9  6.0 - 8.3 g/dL   Albumin 3.7  3.5 - 5.2 g/dL   AST 20  0 - 37 U/L   ALT 32  0 - 53 U/L   Alkaline Phosphatase 74  39 - 117 U/L   Total Bilirubin 0.4  0.3 - 1.2 mg/dL   GFR calc non Af Amer >90  >90 mL/min   GFR calc Af Amer >90  >90 mL/min   Comment: (NOTE)     The eGFR has been calculated using the CKD EPI equation.     This calculation has not been validated in all clinical situations.     eGFR's persistently <90 mL/min signify possible Chronic Kidney     Disease.   Anion gap 13  5 - 15  LIPASE, BLOOD  Status: None   Collection Time    04/03/14 10:49 PM      Result Value Ref Range   Lipase 23  11 - 59 U/L  COMPREHENSIVE METABOLIC PANEL     Status: Abnormal   Collection Time    04/04/14  6:05 AM      Result Value Ref Range   Sodium 141  137 - 147 mEq/L   Potassium 4.3  3.7 - 5.3 mEq/L   Chloride 102  96 - 112 mEq/L   CO2 30  19 - 32 mEq/L   Glucose, Bld 94  70 - 99 mg/dL   BUN 15  6 - 23 mg/dL   Creatinine, Ser 0.88  0.50 - 1.35 mg/dL   Calcium 8.7  8.4 - 10.5 mg/dL   Total Protein 7.3  6.0 - 8.3 g/dL   Albumin 3.3 (*) 3.5 - 5.2 g/dL   AST 20  0 - 37 U/L   ALT 29  0 - 53 U/L   Alkaline Phosphatase 71  39 - 117 U/L   Total Bilirubin 0.7  0.3 - 1.2 mg/dL   GFR calc non Af Amer >90  >90 mL/min   GFR calc Af Amer >90  >90 mL/min   Comment: (NOTE)     The eGFR has been calculated using the CKD EPI equation.     This calculation has not been validated in  all clinical situations.     eGFR's persistently <90 mL/min signify possible Chronic Kidney     Disease.   Anion gap 9  5 - 15  CBC     Status: Abnormal   Collection Time    04/04/14  6:05 AM      Result Value Ref Range   WBC 13.3 (*) 4.0 - 10.5 K/uL   RBC 5.15  4.22 - 5.81 MIL/uL   Hemoglobin 15.0  13.0 - 17.0 g/dL   HCT 42.5  39.0 - 52.0 %   MCV 82.5  78.0 - 100.0 fL   MCH 29.1  26.0 - 34.0 pg   MCHC 35.3  30.0 - 36.0 g/dL   RDW 13.8  11.5 - 15.5 %   Platelets 417 (*) 150 - 400 K/uL    Ct Renal Stone Study  04/03/2014   CLINICAL DATA:  Initial encounter for abdominal pain lasting 1 week. Right upper quadrant pain. Patient states the pain is similar to his previous hernia.  EXAM: CT ABDOMEN AND PELVIS WITHOUT CONTRAST  TECHNIQUE: Multidetector CT imaging of the abdomen and pelvis was performed following the standard protocol without IV contrast.  COMPARISON:  CT abdomen with contrast 11/18/2013.  FINDINGS: The lung bases are clear without focal nodule, mass, or airspace disease. The heart size is normal. No significant pleural or pericardial effusion is present.  Mild diffuse fatty infiltration liver is again noted. The patient is status post splenectomy. A 3.2 cm splenule remains. The stomach, duodenum, and pancreas are within normal limits. The common bile duct and gallbladder are normal. The adrenal glands are normal bilaterally. The kidneys and ureters are within normal limits. Urinary bladder is unremarkable.  The patient is status post ventral hernia repair with mesh. No recurrent hernia is present. A loop of small bowel is associated. There is fat herniated through the defect with associated fluid. A portion of bowel extends into the hernia. There is some change in caliber suggesting partial obstruction. The rectus sigmoid colon is within normal limits. Remainder the colon is unremarkable. The appendix is not  discretely visualized.  No significant intraperitoneal free fluid is present.  There is no significant adenopathy.  The bone windows demonstrate bilateral L5 pars defects with slight anterolisthesis at L5-S1, unchanged.  IMPRESSION: 1. Recurrent hernia containing fat and a single loop of bowel with partial small bowel obstruction. 2. Interval ventral hernia repair with mesh. 3. Status post splenectomy. 4. Fatty infiltration of the liver. 5. Bilateral L5 pars defects.   Electronically Signed   By: Lawrence Santiago M.D.   On: 04/03/2014 23:24    ROS: See chart Blood pressure 133/84, pulse 67, temperature 97.9 F (36.6 C), temperature source Oral, resp. rate 18, height _0  (1.753 m), weight 142.429 kg (314 lb), SpO2 95.00%. Physical Exam: Pleasant white male no acute distress. Abdomen is soft, nontender, nondistended. No hepatosplenomegaly, masses, or appreciable hernias noted at this time. He does have active bowel sounds.  Assessment/Plan: Impression: Small bowel obstruction secondary to possible recurrent hernia along the superior aspect of the previous mesh repair. Currently, patient is asymptomatic. There is no need for acute surgical intervention at this time. Plan: We'll advance to soft diet. Would observe the next 24 hours. Patient states his surgeon will be back in town on 04/10/2014.  Zayanna Pundt A 04/04/2014, 8:50 AM

## 2014-04-04 NOTE — Care Management Note (Unsigned)
    Page 1 of 1   04/04/2014     2:34:38 PM CARE MANAGEMENT NOTE 04/04/2014  Patient:  Sean Murillo,Sean Murillo   Account Number:  [id number]  Date Initiated:  04/04/2014  Documentation initiated by:  Anibal HendersonBOLDEN,Kamika Goodloe  Subjective/Objective Assessment:   Admitted with PSBO. Pt is from home, with spouse and family,  is independent and will return home at D/C     Action/Plan:   No needs identified   Anticipated DC Date:  04/05/2014   Anticipated DC Plan:  HOME/SELF CARE         Choice offered to / List presented to:             Status of service:  In process, will continue to follow Medicare Important Message given?   (If response is "NO", the following Medicare IM given date fields will be blank) Date Medicare IM given:   Medicare IM given by:   Date Additional Medicare IM given:   Additional Medicare IM given by:    Discharge Disposition:    Per UR Regulation:  Reviewed for med. necessity/level of care/duration of stay  If discussed at Long Length of Stay Meetings, dates discussed:    Comments:  04-04-14 1420 Anibal HendersonGeneva Lexianna Weinrich RN/CM

## 2014-04-04 NOTE — Care Management Utilization Note (Signed)
UR completed 

## 2014-04-05 DIAGNOSIS — Z6841 Body Mass Index (BMI) 40.0 and over, adult: Secondary | ICD-10-CM | POA: Diagnosis present

## 2014-04-05 MED ORDER — ALBUTEROL SULFATE (2.5 MG/3ML) 0.083% IN NEBU: 2.5000 mg | INHALATION_SOLUTION | Freq: Four times a day (QID) | RESPIRATORY_TRACT | Status: DC | PRN

## 2014-04-05 NOTE — Progress Notes (Signed)
Patient with orders to be discharged home. Discharge instructions given. Patient verbalized understanding. Patient stable. Patient left in private vehicle with spouse.

## 2014-04-05 NOTE — Discharge Instructions (Signed)
Small Bowel Obstruction °A small bowel obstruction is a blockage (obstruction) of the small intestine (small bowel). The small bowel is a long, slender tube that connects the stomach to the colon. Its job is to absorb nutrients from the fluids and foods you consume into the bloodstream.  °CAUSES  °There are many causes of intestinal blockage. The most common ones include: °· Hernias. This is a more common cause in children than adults. °· Inflammatory bowel disease (enteritis and colitis). °· Twisting of the bowel (volvulus). °· Tumors. °· Scar tissue (adhesions) from previous surgery or radiation treatment. °· Recent surgery. This may cause an acute small bowel obstruction called an ileus. °SYMPTOMS  °· Abdominal pain. This may be dull cramps or sharp pain. It may occur in one area or may be present in the entire abdomen. Pain can range from mild to severe, depending on the degree of obstruction. °· Nausea and vomiting. Vomit may be greenish or yellow bile color. °· Distended or swollen stomach. Abdominal bloating is a common symptom. °· Constipation. °· Lack of passing gas. °· Frequent belching. °· Diarrhea. This may occur if runny stool is able to leak around the obstruction. °DIAGNOSIS  °Your caregiver can usually diagnose small bowel obstruction by taking a history, doing a physical exam, and taking X-rays. If the cause is unclear, a CT scan (computerized tomography) of your abdomen and pelvis may be needed. °TREATMENT  °Treatment of the blockage depends on the cause and how bad the problem is.  °· Sometimes, the obstruction improves with bed rest and intravenous (IV) fluids. °· Resting the bowel is very important. This means following a simple diet. Sometimes, a clear liquid diet may be required for several days. °· Sometimes, a small tube (nasogastric tube) is placed into the stomach to decompress the bowel. When the bowel is blocked, it usually swells up like a balloon filled with air and fluids.  Decompression means that the air and fluids are removed by suction through that tube. This can help with pain, discomfort, and nausea. It can also help the obstruction resolve faster. °· Surgery may be required if other treatments do not work. Bowel obstruction from a hernia may require early surgery and can be an emergency procedure. Adhesions that cause frequent or severe obstructions may also require surgery. °HOME CARE INSTRUCTIONS °If your bowel obstruction is only partial or incomplete, you may be allowed to go home. °· Get plenty of rest. °· Follow your diet as directed by your caregiver. °· Only consume clear liquids until your condition improves. °· Avoid solid foods as instructed. °SEEK IMMEDIATE MEDICAL CARE IF: °· You have increased pain or cramping. °· You vomit blood. °· You have uncontrolled vomiting or nausea. °· You cannot drink fluids due to vomiting or pain. °· You develop confusion. °· You begin feeling very dry or thirsty (dehydrated). °· You have severe bloating. °· You have chills. °· You have a fever. °· You feel extremely weak or you faint. °MAKE SURE YOU: °· Understand these instructions. °· Will watch your condition. °· Will get help right away if you are not doing well or get worse. °Document Released: 09/02/2005 Document Revised: 09/08/2011 Document Reviewed: 08/30/2010 °ExitCare® Patient Information ©2015 [employer]. This information is not intended to replace advice given to you by your health care provider. Make sure you discuss any questions you have with your health care provider. ° °

## 2014-04-05 NOTE — Discharge Summary (Signed)
Physician Discharge Summary  Sean Murillo ZOX:[id number] DOB: Aug 15, 1975 DOA: 04/03/2014  PCP: Trinna Post, MD  Admit date: 04/03/2014 Discharge date: 04/05/2014  Time spent: 40 minutes  Recommendations for Outpatient Follow-up:  1. Follow up with general surgeon on 10/12 as previously scheduled  Discharge Diagnoses:  Principal Problem:   Partial small bowel obstruction Active Problems:   Leukocytosis   Morbid obesity   Discharge Condition: improved  Diet recommendation: low salt  Filed Weights   04/03/14 1745  Weight: 142.429 kg (314 lb)    History of present illness:  This patient was admitted to the hospital with abdominal pain. He recently had hernia surgery and even. He came to the hospital with significant abdominal pain and was found to have a partial small bowel obstruction. He was admitted for further treatments.  Hospital Course:  Patient was followed by general surgery during this hospitalization. Fortunately, he quickly improved and his diet was advanced. He is tolerating solid food at this time without any vomiting. Pain has significantly improved and he is having bowel movements/passing flatus. He was covered appropriately with intravenous antibiotics. His leukocytosis significantly improved, but was likely related to his acute process. Since he is clinically improved, he's been cleared for discharge by general surgery. He'll plan to followup with his personal surgeon in it and next week. Since The patient is tolerating by mouth adequately and is moving his bowels without any vomiting, he'll be discharged home later today.  Procedures:    Consultations:  General surgery  Discharge Exam: Filed Vitals:   04/05/14 1508  BP: 129/92  Pulse: 81  Temp: 97.7 F (36.5 C)  Resp: 20    General: NAD Cardiovascular: S1, S2 RRR Respiratory: CTA B  Discharge Instructions You were cared for by a hospitalist during your hospital stay. If you have any  questions about your discharge medications or the care you received while you were in the hospital after you are discharged, you can call the unit and asked to speak with the hospitalist on call if the hospitalist that took care of you is not available. Once you are discharged, your primary care physician will handle any further medical issues. Please note that NO REFILLS for any discharge medications will be authorized once you are discharged, as it is imperative that you return to your primary care physician (or establish a relationship with a primary care physician if you do not have one) for your aftercare needs so that they can reassess your need for medications and monitor your lab values.  Discharge Instructions   Call MD for:  persistant nausea and vomiting    Complete by:  As directed      Call MD for:  severe uncontrolled pain    Complete by:  As directed      Diet - low sodium heart healthy    Complete by:  As directed      Increase activity slowly    Complete by:  As directed           Discharge Medication List as of 04/05/2014  3:25 PM    CONTINUE these medications which have NOT CHANGED   Details  amoxicillin (AMOXIL) 875 MG tablet Take 875 mg by mouth 2 (two) times daily. 10 day course starting on 03/27/2014, Until Discontinued, Historical Med    guaiFENesin-codeine (ROBITUSSIN AC) 100-10 MG/5ML syrup Take 10 mLs by mouth 2 (two) times daily. 10 day course starting on 03/27/2014, Until Discontinued, Historical Med    ipratropium (  ATROVENT) 0.06 % nasal spray Place 2 sprays into both nostrils 2 (two) times daily., Until Discontinued, Historical Med    sertraline (ZOLOFT) 50 MG tablet Take 50 mg by mouth daily., Until Discontinued, Historical Med       Allergies  Allergen Reactions  . Contrast Media [Iodinated Diagnostic Agents] Hives  . Iohexol Hives    Needs pre meds   Follow-up Information   Follow up with Erskine Speed, MD. (on monday as scheduled)    Specialty:   Surgery   Contact information:   [address] [place] [address] [phone]        The results of significant diagnostics from this hospitalization (including imaging, microbiology, ancillary and laboratory) are listed below for reference.    Significant Diagnostic Studies: Ct Renal Stone Study  04/03/2014   CLINICAL DATA:  Initial encounter for abdominal pain lasting 1 week. Right upper quadrant pain. Patient states the pain is similar to his previous hernia.  EXAM: CT ABDOMEN AND PELVIS WITHOUT CONTRAST  TECHNIQUE: Multidetector CT imaging of the abdomen and pelvis was performed following the standard protocol without IV contrast.  COMPARISON:  CT abdomen with contrast 11/18/2013.  FINDINGS: The lung bases are clear without focal nodule, mass, or airspace disease. The heart size is normal. No significant pleural or pericardial effusion is present.  Mild diffuse fatty infiltration liver is again noted. The patient is status post splenectomy. A 3.2 cm splenule remains. The stomach, duodenum, and pancreas are within normal limits. The common bile duct and gallbladder are normal. The adrenal glands are normal bilaterally. The kidneys and ureters are within normal limits. Urinary bladder is unremarkable.  The patient is status post ventral hernia repair with mesh. No recurrent hernia is present. A loop of small bowel is associated. There is fat herniated through the defect with associated fluid. A portion of bowel extends into the hernia. There is some change in caliber suggesting partial obstruction. The rectus sigmoid colon is within normal limits. Remainder the colon is unremarkable. The appendix is not discretely visualized.  No significant intraperitoneal free fluid is present. There is no significant adenopathy.  The bone windows demonstrate bilateral L5 pars defects with slight anterolisthesis at L5-S1, unchanged.  IMPRESSION: 1. Recurrent hernia containing fat and a single loop of bowel  with partial small bowel obstruction. 2. Interval ventral hernia repair with mesh. 3. Status post splenectomy. 4. Fatty infiltration of the liver. 5. Bilateral L5 pars defects.   Electronically Signed   By: Gennette Pac M.D.   On: 04/03/2014 23:24    Microbiology: No results found for this or any previous visit (from the past 240 hour(s)).   Labs: Basic Metabolic Panel:  Recent Labs Lab 04/03/14 1753 04/03/14 2249 04/04/14 0605  NA 140 140 141  K 4.4 4.3 4.3  CL 99 99 102  CO2 30 28 30   GLUCOSE 95 91 94  BUN 15 15 15   CREATININE 0.79 0.82 0.88  CALCIUM 9.7 9.4 8.7   Liver Function Tests:  Recent Labs Lab 04/03/14 1753 04/03/14 2249 04/04/14 0605  AST 22 20 20   ALT 33 32 29  ALKPHOS 78 74 71  BILITOT 0.4 0.4 0.7  PROT 8.5* 7.9 7.3  ALBUMIN 4.0 3.7 3.3*    Recent Labs Lab 04/03/14 2249  LIPASE 23   No results found for this basename: AMMONIA,  in the last 168 hours CBC:  Recent Labs Lab 04/03/14 1753 04/03/14 2249 04/04/14 0605  WBC 15.7* 16.9*  13.3*  NEUTROABS 7.7 9.5*  --   HGB 16.7 16.2 15.0  HCT 45.8 44.3 42.5  MCV 82.2 82.0 82.5  PLT 424* 406* 417*   Cardiac Enzymes: No results found for this basename: CKTOTAL, CKMB, CKMBINDEX, TROPONINI,  in the last 168 hours BNP: BNP (last 3 results) No results found for this basename: PROBNP,  in the last 8760 hours CBG: No results found for this basename: GLUCAP,  in the last 168 hours     Signed:  MEMON,JEHANZEB  [facility] 04/05/2014, 8:17 PM

## 2014-04-05 NOTE — Progress Notes (Signed)
Subjective: Denies any abdominal pain.  Objective: Vital signs in last 24 hours: Temp:  [97.8 F (36.6 C)-98.4 F (36.9 C)] 97.8 F (36.6 C) (10/07 0601) Pulse Rate:  [80-90] 81 (10/07 0601) Resp:  [20] 20 (10/07 0601) BP: (117-136)/(84-91) 126/87 mmHg (10/07 0601) SpO2:  [92 %-95 %] 95 % (10/07 0601) Last BM Date: 04/03/14  Intake/Output from previous day: 10/06 0701 - 10/07 0700 In: 1931.7 [P.O.:120; I.V.:1811.7] Out: -  Intake/Output this shift:    General appearance: alert, cooperative and no distress GI: Soft, reducible incisional hernia noted. No rigidity noted.  Lab Results:   Recent Labs  04/03/14 2249 04/04/14 0605  WBC 16.9* 13.3*  HGB 16.2 15.0  HCT 44.3 42.5  PLT 406* 417*   BMET  Recent Labs  04/03/14 2249 04/04/14 0605  NA 140 141  K 4.3 4.3  CL 99 102  CO2 28 30  GLUCOSE 91 94  BUN 15 15  CREATININE 0.82 0.88  CALCIUM 9.4 8.7   PT/INR No results found for this basename: LABPROT, INR,  in the last 72 hours  Studies/Results: Ct Renal Stone Study  04/03/2014   CLINICAL DATA:  Initial encounter for abdominal pain lasting 1 week. Right upper quadrant pain. Patient states the pain is similar to his previous hernia.  EXAM: CT ABDOMEN AND PELVIS WITHOUT CONTRAST  TECHNIQUE: Multidetector CT imaging of the abdomen and pelvis was performed following the standard protocol without IV contrast.  COMPARISON:  CT abdomen with contrast 11/18/2013.  FINDINGS: The lung bases are clear without focal nodule, mass, or airspace disease. The heart size is normal. No significant pleural or pericardial effusion is present.  Mild diffuse fatty infiltration liver is again noted. The patient is status post splenectomy. A 3.2 cm splenule remains. The stomach, duodenum, and pancreas are within normal limits. The common bile duct and gallbladder are normal. The adrenal glands are normal bilaterally. The kidneys and ureters are within normal limits. Urinary bladder is  unremarkable.  The patient is status post ventral hernia repair with mesh. No recurrent hernia is present. A loop of small bowel is associated. There is fat herniated through the defect with associated fluid. A portion of bowel extends into the hernia. There is some change in caliber suggesting partial obstruction. The rectus sigmoid colon is within normal limits. Remainder the colon is unremarkable. The appendix is not discretely visualized.  No significant intraperitoneal free fluid is present. There is no significant adenopathy.  The bone windows demonstrate bilateral L5 pars defects with slight anterolisthesis at L5-S1, unchanged.  IMPRESSION: 1. Recurrent hernia containing fat and a single loop of bowel with partial small bowel obstruction. 2. Interval ventral hernia repair with mesh. 3. Status post splenectomy. 4. Fatty infiltration of the liver. 5. Bilateral L5 pars defects.   Electronically Signed   By: Gennette Pac M.D.   On: 04/03/2014 23:24    Anti-infectives: Anti-infectives   Start     Dose/Rate Route Frequency Ordered Stop   04/04/14 1300  amoxicillin-clavulanate (AUGMENTIN) 875-125 MG per tablet 1 tablet     1 tablet Oral Every 12 hours 04/04/14 1259     04/04/14 1000  piperacillin-tazobactam (ZOSYN) IVPB 3.375 g  Status:  Discontinued     3.375 g 12.5 mL/hr over 240 Minutes Intravenous Every 8 hours 04/04/14 0740 04/04/14 1259   04/04/14 0145  piperacillin-tazobactam (ZOSYN) IVPB 3.375 g     3.375 g 12.5 mL/hr over 240 Minutes Intravenous  Once 04/04/14 0135 04/04/14 0636  Assessment/Plan: Impression: Small bowel obstruction secondary to recurrent incisional hernia, resolved from the obstruction standpoint. Tolerating diet well. Okay for discharge from surgery standpoint. He is to followup with his primary surgeon next week.  LOS: 2 days    Sean Murillo A 04/05/2014

## 2014-04-07 ENCOUNTER — Ambulatory Visit (HOSPITAL_COMMUNITY): Admission: RE | Admit: 2014-04-07 | Payer: 59 | Source: Ambulatory Visit

## 2014-04-23 ENCOUNTER — Ambulatory Visit (INDEPENDENT_AMBULATORY_CARE_PROVIDER_SITE_OTHER): Payer: 59 | Admitting: Neurology

## 2014-04-23 DIAGNOSIS — G4719 Other hypersomnia: Secondary | ICD-10-CM

## 2014-04-23 DIAGNOSIS — G4733 Obstructive sleep apnea (adult) (pediatric): Secondary | ICD-10-CM

## 2014-04-23 DIAGNOSIS — E669 Obesity, unspecified: Secondary | ICD-10-CM

## 2014-04-26 ENCOUNTER — Telehealth: Payer: Self-pay | Admitting: Neurology

## 2014-04-26 DIAGNOSIS — G4733 Obstructive sleep apnea (adult) (pediatric): Secondary | ICD-10-CM

## 2014-04-26 NOTE — Telephone Encounter (Signed)
Please call and notify patient that the recent sleep study confirmed the diagnosis of OSA. He did well with CPAP during the study with significant improvement of the respiratory events. Therefore, I would like start the patient on CPAP at home. I placed the order in the chart.   Arrange for CPAP set up at home through a DME company of patient's choice and fax/route report to PCP and referring MD (if other than PCP).   The patient will also need a follow up appointment with me in 6-8 weeks post set up that has to be scheduled; help the patient schedule this (in a follow-up slot).   Please re-enforce the importance of compliance with treatment and the need for us to monitor compliance data.   Once you have spoken to the patient and scheduled the return appointment, you may close this encounter, thanks,   Ashleyann Shoun, MD, PhD [facility] ([facility])    

## 2014-04-27 ENCOUNTER — Encounter: Payer: Self-pay | Admitting: *Deleted

## 2014-04-27 NOTE — Telephone Encounter (Signed)
Patient was notified of the results of his sleep study which revealed severe sleep apnea.  Patient was referred to [facility] for CPAP set up.  A copy of the results were mailed to the patient and a copy was faxed to Dr. Theodis ShoveJunell Koberlein.

## 2014-04-28 ENCOUNTER — Emergency Department (HOSPITAL_COMMUNITY): Payer: 59

## 2014-04-28 ENCOUNTER — Encounter (HOSPITAL_COMMUNITY): Payer: Self-pay | Admitting: Emergency Medicine

## 2014-04-28 ENCOUNTER — Emergency Department (HOSPITAL_COMMUNITY)
Admission: EM | Admit: 2014-04-28 | Discharge: 2014-04-28 | Disposition: A | Discharge status: Home / Self Care | Payer: 59 | Attending: Emergency Medicine | Admitting: Emergency Medicine

## 2014-04-28 DIAGNOSIS — R52 Pain, unspecified: Secondary | ICD-10-CM

## 2014-04-28 DIAGNOSIS — Z792 Long term (current) use of antibiotics: Secondary | ICD-10-CM | POA: Diagnosis not present

## 2014-04-28 DIAGNOSIS — Z79899 Other long term (current) drug therapy: Secondary | ICD-10-CM | POA: Insufficient documentation

## 2014-04-28 DIAGNOSIS — M79671 Pain in right foot: Secondary | ICD-10-CM | POA: Insufficient documentation

## 2014-04-28 DIAGNOSIS — F4310 Post-traumatic stress disorder, unspecified: Secondary | ICD-10-CM | POA: Diagnosis not present

## 2014-04-28 NOTE — ED Provider Notes (Signed)
CSN: [id number]     Arrival date & time 04/28/14  [id number] History   First MD Initiated Contact with Patient 04/28/14 [phone]     Chief Complaint  Patient presents with  . Foot Pain     (Consider location/radiation/quality/duration/timing/severity/associated sxs/prior Treatment) HPI Comments: Pt c/o right foot pain that started yesterday. Pt states that he was seen by his pcp yesterday and had labs for gout done. No history of gout. Pt denies redness or swelling to the area. No know injury. No previous injury to foot  The history is provided by the patient. No language interpreter was used.    Past Medical History  Diagnosis Date  . PTSD (post-traumatic stress disorder)    Past Surgical History  Procedure Laterality Date  . Hernia repair    . Splenectomy, total     Family History  Problem Relation Age of Onset  . Stroke Mother    History  Substance Use Topics  . Smoking status: Never Smoker   . Smokeless tobacco: Current User    Types: Chew     Comment: working on it  . Alcohol Use: Yes     Comment: occ    Review of Systems  All other systems reviewed and are negative.     Allergies  Contrast media and Iohexol  Home Medications   Prior to Admission medications   Medication Sig Start Date End Date Taking? Authorizing Provider  amoxicillin (AMOXIL) 875 MG tablet Take 875 mg by mouth 2 (two) times daily. 10 day course starting on 03/27/2014    Historical Provider, MD  guaiFENesin-codeine (ROBITUSSIN AC) 100-10 MG/5ML syrup Take 10 mLs by mouth 2 (two) times daily. 10 day course starting on 03/27/2014    Historical Provider, MD  ipratropium (ATROVENT) 0.06 % nasal spray Place 2 sprays into both nostrils 2 (two) times daily.    Historical Provider, MD  sertraline (ZOLOFT) 50 MG tablet Take 50 mg by mouth daily.    Historical Provider, MD   BP 133/98  Pulse 83  Temp(Src) 97.5 F (36.4 C) (Oral)  Resp 20  Ht 5\' 9"  (1.753 m)  Wt 314 lb (142.429 kg)  BMI 46.35 kg/m2   SpO2 95% Physical Exam  Nursing note and vitals reviewed. Constitutional: He is oriented to person, place, and time. He appears well-nourished.  Cardiovascular: Normal rate and regular rhythm.   Pulmonary/Chest: Effort normal and breath sounds normal.  Musculoskeletal:  Generalized tenderness along the right 2nd and 3rd metatarsal. No redness or warmth noted. Pulses intact  Neurological: He is alert and oriented to person, place, and time.  Skin: Skin is warm and dry.  Psychiatric: He has a normal mood and affect.    ED Course  Procedures (including critical care time) Labs Review Labs Reviewed - No data to display  Imaging Review Dg Foot Complete Right  04/28/2014   CLINICAL DATA:  Acute right foot pain  EXAM: RIGHT FOOT COMPLETE - 3+ VIEW  COMPARISON:  None.  FINDINGS: Normal alignment without acute fracture. Preserved joint spaces. No significant arthropathy or soft tissue abnormality.  IMPRESSION: No acute osseous finding.   Electronically Signed   By: Ruel Favorsrevor  Shick M.D.   On: 04/28/2014 08:32     EKG Interpretation None      MDM   Final diagnoses:  Pain  Right foot pain    Pt placed in post op shoe for comfort. Exam not consistent with gout. Pt already has oxycodone at home    Fisher ScientificVrinda Chijioke Lasser,  NP 04/28/14 0932

## 2014-04-28 NOTE — Discharge Instructions (Signed)

## 2014-04-28 NOTE — ED Notes (Signed)
Complain of pain in right foot. States he went to his PCP yesterday and has blood work drawn to see if he had gout. Also, was given percocet for pain. States it is not helping.

## 2014-04-29 NOTE — ED Provider Notes (Signed)
Medical screening examination/treatment/procedure(s) were performed by non-physician practitioner and as supervising physician I was immediately available for consultation/collaboration.   EKG Interpretation None       Erle Guster, MD 04/29/14 1045 

## 2014-05-01 ENCOUNTER — Telehealth: Payer: Self-pay | Admitting: Orthopedic Surgery

## 2014-05-01 NOTE — Telephone Encounter (Signed)
ROUTING TO DR HARRISON 

## 2014-05-01 NOTE — Telephone Encounter (Addendum)
Patient called to request appointment for problem of right foot pain; states had initially been seen by primary care, [facility], and states blood work had been ordered, and he has had it done. He then went to [facility] at [facility] as he states the foot pain was increasing.  He said he is on an anti-biotic per his primary care.  Their office has sent notes; lab work is [facility] his [facility] Epic chart.  Patient's ph# is [phone]

## 2014-05-02 NOTE — Telephone Encounter (Signed)
ROUTING TO SCHEDULING 

## 2014-05-02 NOTE — Telephone Encounter (Signed)
Schedule for dec 3

## 2014-05-02 NOTE — Telephone Encounter (Signed)
Called back to patient to offer appointment per Dr Romeo AppleHarrison; patient states he has already been scheduled/referred by his primary care doctor, to [place] in [place], and has the appointment for 05/16/14.

## 2015-01-01 ENCOUNTER — Emergency Department (HOSPITAL_COMMUNITY)
Admission: EM | Admit: 2015-01-01 | Discharge: 2015-01-01 | Disposition: A | Discharge status: Home / Self Care | Payer: 59 | Attending: Emergency Medicine | Admitting: Emergency Medicine

## 2015-01-01 ENCOUNTER — Encounter (HOSPITAL_COMMUNITY): Payer: Self-pay | Admitting: Emergency Medicine

## 2015-01-01 DIAGNOSIS — F4310 Post-traumatic stress disorder, unspecified: Secondary | ICD-10-CM | POA: Diagnosis not present

## 2015-01-01 DIAGNOSIS — Z79899 Other long term (current) drug therapy: Secondary | ICD-10-CM | POA: Diagnosis not present

## 2015-01-01 DIAGNOSIS — E669 Obesity, unspecified: Secondary | ICD-10-CM | POA: Insufficient documentation

## 2015-01-01 DIAGNOSIS — R030 Elevated blood-pressure reading, without diagnosis of hypertension: Secondary | ICD-10-CM | POA: Insufficient documentation

## 2015-01-01 DIAGNOSIS — R040 Epistaxis: Secondary | ICD-10-CM | POA: Diagnosis not present

## 2015-01-01 DIAGNOSIS — J329 Chronic sinusitis, unspecified: Secondary | ICD-10-CM | POA: Diagnosis not present

## 2015-01-01 MED ORDER — OXYCODONE-ACETAMINOPHEN 5-325 MG PO TABS
2.0000 | ORAL_TABLET | Freq: Once | ORAL | Status: AC
Start: 2015-01-01 — End: 2015-01-01
  Administered 2015-01-01: 2 via ORAL
  Filled 2015-01-01: qty 2

## 2015-01-01 MED ORDER — OXYMETAZOLINE HCL 0.05 % NA SOLN
2.0000 | Freq: Once | NASAL | Status: AC
  Administered 2015-01-01: 2 via NASAL
  Filled 2015-01-01: qty 15

## 2015-01-01 MED ORDER — AMOXICILLIN-POT CLAVULANATE 875-125 MG PO TABS: 1.0000 | ORAL_TABLET | Freq: Two times a day (BID) | ORAL | Status: DC

## 2015-01-01 NOTE — ED Provider Notes (Signed)
CSN: [id number]     Arrival date & time 01/01/15  1005 History   First MD Initiated Contact with Patient 01/01/15 1018     Chief Complaint  Patient presents with  . Epistaxis     (Consider location/radiation/quality/duration/timing/severity/associated sxs/prior Treatment) HPI   Gwenyth BenderMichael Towry is a 39 y.o. male who presents to the Emergency Department complaining of sudden onset of nosebleed this morning approximately one hour prior to arrival.  Reports bleeding from both nares, but mainly from the right.  Bled for nearly one hour, controlled after applying direct pressure.  He reports having sinus pressure, nasal congestion and facial pressure for 5 days.  Has taken Sudafed without relief.  He denies cough, chest pain, shortness of breath, fever, neck pain or stiffness,  headaches and dizziness.     Past Medical History  Diagnosis Date  . PTSD (post-traumatic stress disorder)    Past Surgical History  Procedure Laterality Date  . Hernia repair    . Splenectomy, total     Family History  Problem Relation Age of Onset  . Stroke Mother    History  Substance Use Topics  . Smoking status: Never Smoker   . Smokeless tobacco: Current User    Types: Chew     Comment: working on it  . Alcohol Use: No     Comment: occ    Review of Systems  Constitutional: Negative for activity change and appetite change.  HENT: Positive for congestion, nosebleeds and sinus pressure. Negative for ear pain, facial swelling and trouble swallowing.   Respiratory: Negative for chest tightness and shortness of breath.   Cardiovascular: Negative for chest pain and palpitations.  Gastrointestinal: Negative for nausea and vomiting.  Skin: Negative for rash.  Neurological: Negative for dizziness, syncope, weakness, light-headedness, numbness and headaches.  All other systems reviewed and are negative.     Allergies  Contrast media and Iohexol  Home Medications   Prior to Admission medications    Medication Sig Start Date End Date Taking? Authorizing Provider  Influenza vac split quadrivalent PF (FLUARIX) 0.5 ML injection Inject 0.5 mLs into the muscle once.    Historical Provider, MD  oxyCODONE-acetaminophen (PERCOCET) 10-325 MG per tablet Take 1 tablet by mouth every 6 (six) hours as needed for pain.    Historical Provider, MD  pneumococcal 23 valent vaccine (PNEUMOVAX 23) 25 MCG/0.5ML injection Inject 0.5 mLs into the muscle once.    Historical Provider, MD  sertraline (ZOLOFT) 100 MG tablet Take 100 mg by mouth daily.    Historical Provider, MD   BP 140/101 mmHg  Pulse 100  Temp(Src) 98.6 F (37 C) (Oral)  Resp 20  Ht 5\' 9"  (1.753 m)  Wt 339 lb (153.769 kg)  BMI 50.04 kg/m2  SpO2 98%   Physical Exam  Constitutional: He appears well-developed and well-nourished. No distress.  obese  HENT:  Head: Normocephalic and atraumatic.  Nose: Mucosal edema present. No septal deviation or nasal septal hematoma. No epistaxis. Right sinus exhibits maxillary sinus tenderness and frontal sinus tenderness. Left sinus exhibits maxillary sinus tenderness and frontal sinus tenderness.  Mouth/Throat: Uvula is midline, oropharynx is clear and moist and mucous membranes are normal. No uvula swelling.  No active epistaxis, edema of the bilateral nares. No blood seen within the oropharynx  Neck: Normal range of motion. Neck supple.  Cardiovascular: Normal rate, regular rhythm and normal heart sounds.   No murmur heard. Pulmonary/Chest: Effort normal and breath sounds normal. No respiratory distress.  Nursing note and  vitals reviewed.   ED Course  Procedures (including critical care time) Labs Review Labs Reviewed - No data to display  Imaging Review No results found.   EKG Interpretation None      MDM   Final diagnoses:  Epistaxis  Sinusitis, unspecified chronicity, unspecified location    Pt is well appearing.  vitals stable although pt mildly hypertensive.  No hx of HTN.  Pt  is asymptomatic except epistaxis and likely sinusitis.  Treated with Afrin and pt is feeling better, observed in ED without further bleeding.  Pt agrees to tx for sinusitis and close PMD for further evaluation of possible early HTN.  Pt stable for d/c    Pauline Aus, PA-C 01/03/15 1645  Geoffery Lyons, MD 01/04/15 [id number]

## 2015-01-01 NOTE — Discharge Instructions (Signed)
Nosebleed A nosebleed can be caused by many things, including:  Getting hit hard in the nose.  Infections.  Dry nose.  Colds.  Medicines. Your doctor may do lab testing if you get nosebleeds a lot and the cause is not known. HOME CARE   If your nose was packed with material, keep it there until your doctor takes it out. Put the pack back in your nose if the pack falls out.  Do not blow your nose for 12 hours after the nosebleed.  Sit up and bend forward if your nose starts bleeding again. Pinch the front half of your nose nonstop for 20 minutes.  Put petroleum jelly inside your nose every morning if you have a dry nose.  Use a humidifier to make the air less dry.  Do not take aspirin.  Try not to strain, lift, or bend at the waist for many days after the nosebleed. GET HELP RIGHT AWAY IF:   Nosebleeds keep happening and are hard to stop or control.  You have bleeding or bruises that are not normal on other parts of the body.  You have a fever.  The nosebleeds get worse.  You get lightheaded, feel faint, sweaty, or throw up (vomit) blood. MAKE SURE YOU:   Understand these instructions.  Will watch your condition.  Will get help right away if you are not doing well or get worse. Document Released: 03/25/2008 Document Revised: 09/08/2011 Document Reviewed: 03/25/2008 [facility] Patient Information 2015 [place], [place]. This information is not intended to replace advice given to you by your health care provider. Make sure you discuss any questions you have with your health care provider.  Sinusitis Sinusitis is redness, soreness, and puffiness (inflammation) of the air pockets in the bones of your face (sinuses). The redness, soreness, and puffiness can cause air and mucus to get trapped in your sinuses. This can allow germs to grow and cause an infection.  HOME CARE   Drink enough fluids to keep your pee (urine) clear or pale yellow.  Use a humidifier in your  home.  Run a hot shower to create steam in the bathroom. Sit in the bathroom with the door closed. Breathe in the steam 3-4 times a day.  Put a warm, moist washcloth on your face 3-4 times a day, or as told by your doctor.  Use salt water sprays (saline sprays) to wet the thick fluid in your nose. This can help the sinuses drain.  Only take medicine as told by your doctor. GET HELP RIGHT AWAY IF:   Your pain gets worse.  You have very bad headaches.  You are sick to your stomach (nauseous).  You throw up (vomit).  You are very sleepy (drowsy) all the time.  Your face is puffy (swollen).  Your vision changes.  You have a stiff neck.  You have trouble breathing. MAKE SURE YOU:   Understand these instructions.  Will watch your condition.  Will get help right away if you are not doing well or get worse. Document Released: 12/03/2007 Document Revised: 03/10/2012 Document Reviewed: 01/20/2012 [facility] Park[facility] Patient Information 2015 [place], [place]. This information is not intended to replace advice given to you by your health care provider. Make sure you discuss any questions you have with your health care provider.

## 2015-01-01 NOTE — ED Notes (Signed)
Pt reports sinus infection since Thur. Pt states his nose started bleeding this am. Minimal bleeding noted.

## 2015-07-03 ENCOUNTER — Emergency Department (HOSPITAL_COMMUNITY)
Admission: EM | Admit: 2015-07-03 | Discharge: 2015-07-03 | Disposition: A | Discharge status: Home / Self Care | Payer: 59 | Attending: Emergency Medicine | Admitting: Emergency Medicine

## 2015-07-03 ENCOUNTER — Emergency Department (HOSPITAL_COMMUNITY): Payer: 59

## 2015-07-03 ENCOUNTER — Encounter (HOSPITAL_COMMUNITY): Payer: Self-pay | Admitting: Emergency Medicine

## 2015-07-03 DIAGNOSIS — Z79899 Other long term (current) drug therapy: Secondary | ICD-10-CM | POA: Diagnosis not present

## 2015-07-03 DIAGNOSIS — S4991XA Unspecified injury of right shoulder and upper arm, initial encounter: Secondary | ICD-10-CM | POA: Diagnosis present

## 2015-07-03 DIAGNOSIS — W01198A Fall on same level from slipping, tripping and stumbling with subsequent striking against other object, initial encounter: Secondary | ICD-10-CM | POA: Diagnosis not present

## 2015-07-03 DIAGNOSIS — Y9289 Other specified places as the place of occurrence of the external cause: Secondary | ICD-10-CM | POA: Insufficient documentation

## 2015-07-03 DIAGNOSIS — Y9389 Activity, other specified: Secondary | ICD-10-CM | POA: Diagnosis not present

## 2015-07-03 DIAGNOSIS — Z8659 Personal history of other mental and behavioral disorders: Secondary | ICD-10-CM | POA: Diagnosis not present

## 2015-07-03 DIAGNOSIS — M7501 Adhesive capsulitis of right shoulder: Secondary | ICD-10-CM

## 2015-07-03 DIAGNOSIS — Y998 Other external cause status: Secondary | ICD-10-CM | POA: Insufficient documentation

## 2015-07-03 DIAGNOSIS — S40011A Contusion of right shoulder, initial encounter: Secondary | ICD-10-CM | POA: Insufficient documentation

## 2015-07-03 MED ORDER — DICLOFENAC SODIUM 1 % TD GEL: 4.0000 g | Freq: Four times a day (QID) | TRANSDERMAL | Status: DC

## 2015-07-03 MED ORDER — OXYCODONE-ACETAMINOPHEN 5-325 MG PO TABS
2.0000 | ORAL_TABLET | Freq: Once | ORAL | Status: AC
  Administered 2015-07-03: 2 via ORAL
  Filled 2015-07-03: qty 2

## 2015-07-03 MED ORDER — HYDROCODONE-ACETAMINOPHEN 5-325 MG PO TABS: 1.0000 | ORAL_TABLET | Freq: Four times a day (QID) | ORAL | Status: DC | PRN

## 2015-07-03 MED ORDER — DIAZEPAM 5 MG PO TABS: 5.0000 mg | ORAL_TABLET | Freq: Two times a day (BID) | ORAL | Status: DC | PRN

## 2015-07-03 NOTE — Discharge Instructions (Signed)
We recommended that you complete "wall walking" 3-4 times per day. You should also be icing your shoulder 3-4 times per day for 15-20 minutes each time. Use Voltaren as prescribed for inflammation and Valium as needed for muscle spasms. You may take Norco as needed for pain control. Have your symptoms rechecked by your primary care doctor. Follow-up with an orthopedist if symptoms persist or worsen.  Adhesive Capsulitis Adhesive capsulitis is inflammation of the tendons and ligaments that surround the shoulder joint (shoulder capsule). This condition causes the shoulder to become stiff and painful to move. Adhesive capsulitis is also called frozen shoulder. CAUSES This condition may be caused by:  An injury to the shoulder joint.  Straining the shoulder.  Not moving the shoulder for a period of time. This can happen if your arm was injured or in a sling.  Long-standing health problems, such as:  Diabetes.  Thyroid problems.  Heart disease.  Stroke.  Rheumatoid arthritis.  Lung disease. In some cases, the cause may not be known. RISK FACTORS This condition is more likely to develop in:  Women.  People who are older than 38 years of age. SYMPTOMS Symptoms of this condition include:  Pain in the shoulder when moving the arm. There may also be pain when parts of the shoulder are touched. The pain is worse at night or when at rest.  Soreness or aching in the shoulder.  Inability to move the shoulder normally.  Muscle spasms. DIAGNOSIS This condition is diagnosed with a physical exam and imaging tests, such as an X-ray or MRI. TREATMENT This condition may be treated with:  Treatment of the underlying cause or condition.  Physical therapy. This involves performing exercises to get the shoulder moving again.  Medicine. Medicine may be given to relieve pain, inflammation, or muscle spasms.  Steroid injections into the shoulder joint.  Shoulder manipulation. This is a  procedure to move the shoulder into another position. It is done after you are given a medicine to make you fall asleep (general anesthetic). The joint may also be injected with salt water at high pressure to break down scarring.  Surgery. This may be done in severe cases when other treatments have failed. Although most people recover completely from adhesive capsulitis, some may not regain the full movement of the shoulder. HOME CARE INSTRUCTIONS  Take over-the-counter and prescription medicines only as told by your health care provider.  If you are being treated with physical therapy, follow instructions from your physical therapist.  Avoid exercises that put a lot of demand on your shoulder, such as throwing. These exercises can make pain worse.  If directed, apply ice to the injured area:  Put ice in a plastic bag.  Place a towel between your skin and the bag.  Leave the ice on for 20 minutes, 2-3 times per day. SEEK MEDICAL CARE IF:  You develop new symptoms.  Your symptoms get worse.   This information is not intended to replace advice given to you by your health care provider. Make sure you discuss any questions you have with your health care provider.   Document Released: 04/13/2009 Document Revised: 03/07/2015 Document Reviewed: 10/09/2014 Elsevier Interactive Patient Education [employer] Inc.

## 2015-07-03 NOTE — ED Provider Notes (Signed)
History  By signing my name below, I, Sean Murillo, attest that this documentation has been prepared under the direction and in the presence of TRW AutomotiveKelly Roslin Norwood, PA-C. Electronically Signed: Karle PlumberJennifer Murillo, ED Scribe. 07/03/2015. 11:24 PM.  Chief Complaint  Patient presents with  . Shoulder Injury   The history is provided by the patient and medical records. No language interpreter was used.    HPI Comments:  Sean Murillo is a 40 y.o. morbidly obese male who presents to the Emergency Department complaining of worsening, sharp right shoulder pain secondary to falling in the shower four days ago. He states he hit the posterior right shoulder on the side of the tub. He reports associated mild tingling in the fingers. Pt states the pain was mild at first but has been progressively worsening and became worse today while at work. He has taken Aleve and Tylenol with no significant relief of the pain. Moving the RUE exacerbates the pain. He denies alleviating factors. He denies LOC, head trauma, numbness or weakness of the RUE, bruising or wounds.   Past Medical History  Diagnosis Date  . PTSD (post-traumatic stress disorder)    Past Surgical History  Procedure Laterality Date  . Hernia repair    . Splenectomy, total    . Mandible surgery    . Cyst removed from right shoulder     Family History  Problem Relation Age of Onset  . Stroke Mother    Social History  Substance Use Topics  . Smoking status: Never Smoker   . Smokeless tobacco: Current User    Types: Chew     Comment: working on it  . Alcohol Use: Yes     Comment: rare    Review of Systems  Musculoskeletal: Positive for myalgias.  Skin: Negative for color change and wound.  Neurological: Negative for syncope, weakness and numbness.  All other systems reviewed and are negative.   Allergies  Contrast media and Iohexol  Home Medications   Prior to Admission medications   Medication Sig Start Date End Date Taking?  Authorizing Provider  cholecalciferol (VITAMIN D) 1000 units tablet Take 2,000 Units by mouth daily.   Yes Historical Provider, MD  naproxen sodium (ANAPROX) 220 MG tablet Take 220 mg by mouth every 12 (twelve) hours as needed (pain).   Yes Historical Provider, MD  vitamin B-12 (CYANOCOBALAMIN) 250 MCG tablet Take 250 mcg by mouth daily.   Yes Historical Provider, MD  amoxicillin-clavulanate (AUGMENTIN) 875-125 MG per tablet Take 1 tablet by mouth 2 (two) times daily. For 7 days Patient not taking: Reported on 07/03/2015 01/01/15   Tammy Triplett, PA-C  diazepam (VALIUM) 5 MG tablet Take 1 tablet (5 mg total) by mouth every 12 (twelve) hours as needed for muscle spasms. 07/03/15   Antony MaduraKelly Furkan Keenum, PA-C  diclofenac sodium (VOLTAREN) 1 % GEL Apply 4 g topically 4 (four) times daily. 07/03/15   Antony MaduraKelly Rielle Schlauch, PA-C  HYDROcodone-acetaminophen (NORCO/VICODIN) 5-325 MG tablet Take 1-2 tablets by mouth every 6 (six) hours as needed for severe pain. 07/03/15   Antony MaduraKelly Sabina Beavers, PA-C   Triage Vitals: BP 146/108 mmHg  Pulse 108  Temp(Src) 98 F (36.7 C) (Oral)  Resp 20  SpO2 97% Physical Exam  Constitutional: He is oriented to person, place, and time. He appears well-developed and well-nourished. No distress.  Nontoxic/nonseptic appearing  HENT:  Head: Normocephalic and atraumatic.  Eyes: Conjunctivae and EOM are normal. No scleral icterus.  Neck: Normal range of motion.  Cardiovascular: Regular rhythm and intact  distal pulses.   Distal radial pulse 2+ in the RUE  Pulmonary/Chest: Effort normal. No respiratory distress.  Respirations even and unlabored.  Musculoskeletal: Normal range of motion.       Right shoulder: He exhibits tenderness and pain. He exhibits normal range of motion, no bony tenderness, no swelling, no effusion, no crepitus and no deformity.       Arms: Preserved passive range of motion of the right shoulder, though active range of motion is decreased secondary to pain. Tenderness noted to the [facility]  joint without evidence of AC joint separation. No sulcus sign. No erythema or heat to touch.  Neurological: He is alert and oriented to person, place, and time. He exhibits normal muscle tone. Coordination normal.  Sensation to light touch intact. Grip strength 5/5 in the right upper extremity.  Skin: Skin is warm and dry. No rash noted. He is not diaphoretic. No erythema. No pallor.  Psychiatric: He has a normal mood and affect. His behavior is normal.  Nursing note and vitals reviewed.   ED Course  Procedures (including critical care time) DIAGNOSTIC STUDIES: Oxygen Saturation is 97% on RA, adequate by my interpretation.   COORDINATION OF CARE: 11:25 PM- Will prescribe Voltaren Gel, muscle relaxer and pain medication. Pt verbalizes understanding and agrees to plan.  Medications  oxyCODONE-acetaminophen (PERCOCET/ROXICET) 5-325 MG per tablet 2 tablet (not administered)    Imaging Review Dg Shoulder Right  07/03/2015  CLINICAL DATA:  Slip and fall in the shower five days prior, persistent right shoulder pain. EXAM: RIGHT SHOULDER - 2+ VIEW COMPARISON:  None. FINDINGS: No fracture or dislocation. The alignment and joint spaces are maintained. No focal soft tissue abnormality. IMPRESSION: No fracture or dislocation right shoulder. Electronically Signed   By: Rubye Oaks M.D.   On: 07/03/2015 22:52   I have personally reviewed and evaluated these images and lab results as part of my medical decision-making.   MDM   Final diagnoses:  Shoulder contusion, right, initial encounter  Adhesive capsulitis of right shoulder    40 year old male presents to the emergency department for evaluation of right shoulder pain after a fall in the shower. He denies head trauma or loss of consciousness. He is neurovascularly intact. No concern for septic joint or hemarthrosis. No evidence of fracture or dislocation on x-ray. Findings suggestive of adhesive capsulitis. Will treat supportively with  stretching, NSAIDs, muscle relaxers, and Norco as needed. Patient given orthopedic referral should symptoms persist or worsen. Return precautions given at discharge. Patient discharged in satisfactory condition with no unaddressed concerns.  I personally performed the services described in this documentation, which was scribed in my presence. The recorded information has been reviewed and is accurate.       Antony Madura, PA-C 07/03/15 2337  Rolland Porter, MD 07/07/15 [phone]

## 2015-07-03 NOTE — ED Notes (Signed)
Pt states he fell in the shower on Friday and injured his right shoulder  No obvious deformity noted  Pt has limited ROM due to pain  CMS checks wnl

## 2015-11-11 ENCOUNTER — Emergency Department (HOSPITAL_COMMUNITY): Payer: 59

## 2015-11-11 ENCOUNTER — Encounter (HOSPITAL_COMMUNITY): Payer: Self-pay | Admitting: *Deleted

## 2015-11-11 ENCOUNTER — Emergency Department (HOSPITAL_COMMUNITY)
Admission: EM | Admit: 2015-11-11 | Discharge: 2015-11-11 | Disposition: A | Discharge status: Home / Self Care | Payer: 59 | Attending: Emergency Medicine | Admitting: Emergency Medicine

## 2015-11-11 DIAGNOSIS — M25561 Pain in right knee: Secondary | ICD-10-CM

## 2015-11-11 DIAGNOSIS — Y998 Other external cause status: Secondary | ICD-10-CM | POA: Insufficient documentation

## 2015-11-11 DIAGNOSIS — Z79899 Other long term (current) drug therapy: Secondary | ICD-10-CM | POA: Insufficient documentation

## 2015-11-11 DIAGNOSIS — Z8659 Personal history of other mental and behavioral disorders: Secondary | ICD-10-CM | POA: Diagnosis not present

## 2015-11-11 DIAGNOSIS — Y92007 Garden or yard of unspecified non-institutional (private) residence as the place of occurrence of the external cause: Secondary | ICD-10-CM | POA: Insufficient documentation

## 2015-11-11 DIAGNOSIS — W1839XA Other fall on same level, initial encounter: Secondary | ICD-10-CM | POA: Diagnosis not present

## 2015-11-11 DIAGNOSIS — S8991XA Unspecified injury of right lower leg, initial encounter: Secondary | ICD-10-CM | POA: Diagnosis not present

## 2015-11-11 DIAGNOSIS — Z791 Long term (current) use of non-steroidal anti-inflammatories (NSAID): Secondary | ICD-10-CM | POA: Insufficient documentation

## 2015-11-11 DIAGNOSIS — Y9301 Activity, walking, marching and hiking: Secondary | ICD-10-CM | POA: Insufficient documentation

## 2015-11-11 MED ORDER — OXYCODONE-ACETAMINOPHEN 5-325 MG PO TABS: 2.0000 | ORAL_TABLET | Freq: Three times a day (TID) | ORAL | Status: DC | PRN

## 2015-11-11 MED ORDER — OXYCODONE-ACETAMINOPHEN 5-325 MG PO TABS
1.0000 | ORAL_TABLET | Freq: Once | ORAL | Status: AC
  Administered 2015-11-11: 1 via ORAL
  Filled 2015-11-11: qty 1

## 2015-11-11 MED ORDER — SODIUM CHLORIDE 0.9 % IV BOLUS (SEPSIS)
1000.0000 mL | Freq: Once | INTRAVENOUS | Status: AC
  Administered 2015-11-11: 1000 mL via INTRAVENOUS

## 2015-11-11 NOTE — ED Notes (Signed)
The pt fell going across his yard his rt knee just gave way yesterday  Afternoon.  C/o pain

## 2015-11-11 NOTE — Progress Notes (Signed)
Orthopedic Tech Progress Note Patient Details:  Sean BenderMichael Murillo 09/23/1975 [id number]  Ortho Devices Type of Ortho Device: Crutches, Knee Immobilizer Ortho Device/Splint Location: rle Ortho Device/Splint Interventions: Application   [name] 11/11/2015, 9:45 AM

## 2015-11-11 NOTE — ED Provider Notes (Signed)
CSN: [id number]     Arrival date & time 11/11/15  0354 History   First MD Initiated Contact with Patient 11/11/15 [phone]     Chief Complaint  Patient presents with  . Knee Injury   (Consider location/radiation/quality/duration/timing/severity/associated sxs/prior Treatment) HPI 40 y.o. male presents to the Emergency Department today complaining of right knee pain since 9PM last night. States that he was walking from his car to his house when he felt his knee buckle and pop underneath him. Notes immediate pain with 10/10 sharp discomfort. Similar at rest. OTC remedies without relief. Notes pain with ambulation. No fevers. No other symptoms noted.   Past Medical History  Diagnosis Date  . PTSD (post-traumatic stress disorder)    Past Surgical History  Procedure Laterality Date  . Hernia repair    . Splenectomy, total    . Mandible surgery    . Cyst removed from right shoulder     Family History  Problem Relation Age of Onset  . Stroke Mother    Social History  Substance Use Topics  . Smoking status: Never Smoker   . Smokeless tobacco: Current User    Types: Chew     Comment: working on it  . Alcohol Use: Yes     Comment: rare    Review of Systems  Constitutional: Negative for fever.  Musculoskeletal: Positive for joint swelling and arthralgias.  Skin: Negative for rash and wound.   Allergies  Contrast media and Iohexol  Home Medications   Prior to Admission medications   Medication Sig Start Date End Date Taking? Authorizing Provider  amoxicillin-clavulanate (AUGMENTIN) 875-125 MG per tablet Take 1 tablet by mouth 2 (two) times daily. For 7 days Patient not taking: Reported on 07/03/2015 01/01/15   Tammy Triplett, PA-C  cholecalciferol (VITAMIN D) 1000 units tablet Take 2,000 Units by mouth daily.    Historical Provider, MD  diazepam (VALIUM) 5 MG tablet Take 1 tablet (5 mg total) by mouth every 12 (twelve) hours as needed for muscle spasms. 07/03/15   Antony MaduraKelly Humes, PA-C   diclofenac sodium (VOLTAREN) 1 % GEL Apply 4 g topically 4 (four) times daily. 07/03/15   Antony MaduraKelly Humes, PA-C  HYDROcodone-acetaminophen (NORCO/VICODIN) 5-325 MG tablet Take 1-2 tablets by mouth every 6 (six) hours as needed for severe pain. 07/03/15   Antony MaduraKelly Humes, PA-C  naproxen sodium (ANAPROX) 220 MG tablet Take 220 mg by mouth every 12 (twelve) hours as needed (pain).    Historical Provider, MD  vitamin B-12 (CYANOCOBALAMIN) 250 MCG tablet Take 250 mcg by mouth daily.    Historical Provider, MD   BP 148/111 mmHg  Pulse 96  Temp(Src) 99.6 F (37.6 C) (Oral)  Resp 19  Ht 5\' 9"  (1.753 m)  Wt 154.223 kg  BMI 50.19 kg/m2  SpO2 96%   Physical Exam  Constitutional: He is oriented to person, place, and time. He appears well-developed and well-nourished.  HENT:  Head: Normocephalic and atraumatic.  Eyes: EOM are normal. Pupils are equal, round, and reactive to light.  Neck: Normal range of motion. Neck supple. No tracheal deviation present.  Cardiovascular: Normal rate and regular rhythm.   Pulmonary/Chest: Effort normal.  Abdominal: Soft.  Musculoskeletal:       Right knee: He exhibits decreased range of motion and swelling. He exhibits no effusion, no ecchymosis, no deformity, no laceration and no erythema.  Negative anterior/poster drawer bilaterally. No varus or valgus laxity. No crepitus. Pain with flexion and extension. TTP on anterior tibia. Focal swelling noted  over anterior tibia. Neurovascularly intact.    Neurological: He is alert and oriented to person, place, and time.  Skin: Skin is warm and dry.  Psychiatric: He has a normal mood and affect. His behavior is normal. Thought content normal.  Nursing note and vitals reviewed.  ED Course  Procedures (including critical care time) Labs Review Labs Reviewed - No data to display  Imaging Review Ct Knee Right Wo Contrast  11/11/2015  CLINICAL DATA:  Fall yesterday with pain and swelling infrapatellar region. EXAM: CT OF THE RIGHT  KNEE WITHOUT CONTRAST TECHNIQUE: Multidetector CT imaging of the right knee was performed according to the standard protocol. Multiplanar CT image reconstructions were also generated. COMPARISON:  Plain films 11/11/2015 FINDINGS: Bony structures are within normal without evidence of fracture or dislocation. There is a small joint effusion. Mild subcutaneous edema over the infrapatellar region. Remainder the exam is unremarkable. IMPRESSION: No acute fracture or dislocation. Small joint effusion. Mild subcutaneous edema over the infrapatellar region. Electronically Signed   By: Elberta Fortis M.D.   On: 11/11/2015 08:49   Dg Knee Complete 4 Views Right  11/11/2015  CLINICAL DATA:  Pain after falling in the yard last night. EXAM: RIGHT KNEE - COMPLETE 4+ VIEW COMPARISON:  None. FINDINGS: There is no evidence of fracture, dislocation, or joint effusion. There is no evidence of arthropathy or other focal bone abnormality. Soft tissues are unremarkable. IMPRESSION: Negative. Electronically Signed   By: Ellery Plunk M.D.   On: 11/11/2015 04:45   I have personally reviewed and evaluated these images and lab results as part of my medical decision-making.   EKG Interpretation None      MDM  I have reviewed and evaluated the relevant imaging studies.  I have reviewed the relevant previous healthcare records. I obtained HPI from historian.  ED Course:  Assessment: Pt is a 40yM who presents with right knee pain since last night after ambulation. No significant trauma to area. On exam, pt in NAD. Nontoxic/nonseptic appearing. VSS. Afebrile. Limited ROM due to pain. Pain with ambulation. TTP anterior tibia. Focal swelling noted. Concern for Tib Plat Fx. XR unremarkable. CT unremarkable. Given percocet in ED. Plan is to DC home with knee brace and crutches. Possible ligamentous injury vs meniscal. Will have pt follow up with Ortho. Given Rx percocet #5. At time of discharge, Patient is in no acute distress.  Vital Signs are stable. Patient is able to ambulate. Patient able to tolerate PO.    10:13 AM- Upon discharge, pt became pale and diaphoretic upon standing up. No CP/SOB. Given IV Fluids and placed on monitor. EKG NSR. No acute abnormalities. Most likely dehydration induced. Pt symptoms improved.  On reassessment pt feeling much better with fluids and food. At time of discharge, Patient is in no acute distress. Vital Signs are stable. Patient is able to ambulate. Patient able to tolerate PO.   Disposition/Plan:  DC Home Additional Verbal discharge instructions given and discussed with patient.  Pt Instructed to f/u with PCP in the next week for evaluation and treatment of symptoms. Return precautions given Pt acknowledges and agrees with plan  Supervising Physician Layla Maw Ward, DO   Final diagnoses:  Right knee pain      Audry Pili, PA-C 11/11/15 4098  Audry Pili, PA-C 11/11/15 1341  Kristen N Ward, DO 11/12/15 0005

## 2015-11-11 NOTE — ED Notes (Signed)
Pt ambulated without any dizziness or negative symptoms.

## 2015-11-11 NOTE — ED Notes (Signed)
Ice pak given 

## 2015-11-11 NOTE — Discharge Instructions (Signed)
Please read and follow all provided instructions.  Your diagnoses today include:  1. Right knee pain    Tests performed today include:  Vital signs. See below for your results today.   Medications prescribed:   Take as prescribed  You have been prescribed a narcotic medication on an "as needed" basis. Take only as prescribed. Do not drive, operate any machinery or make any important decisions while taking this medication as it is sedating. It may cause constipation take over the counter stool softeners or add fiber to your diet to treat this (Metamucil, Psyllium Fiber, Colace, Miralax) Further refills will need to be obtained from your primary care doctor and will not be prescribed through the Emergency Department. You will test positive on most drug tests while taking this medciation.   You can use Ibuprofen 400mg  combined with Tylenol 650mg  for pain relief every 6 hours. Do not exceed 4g of Tylenol in one 24 hour period. Use narcotics if pain uncontrolled with the aforementioned regiment.   Home care instructions:  Follow any educational materials contained in this packet.  Follow-up instructions: Please follow-up with Orthopedics for further evaluation of symptoms and treatment   Return instructions:   Please return to the Emergency Department if you do not get better, if you get worse, or new symptoms OR  - Fever (temperature greater than 101.15F)  - Bleeding that does not stop with holding pressure to the area    -Severe pain (please note that you may be more sore the day after your accident)  - Chest Pain  - Difficulty breathing  - Severe nausea or vomiting  - Inability to tolerate food and liquids  - Passing out  - Skin becoming red around your wounds  - Change in mental status (confusion or lethargy)  - New numbness or weakness     Please return if you have any other emergent concerns.  Additional Information:  Your vital signs today were: BP 122/91 mmHg   Pulse 88    Temp(Src) 99.6 F (37.6 C) (Oral)   Resp 19   Ht 5\' 9"  (1.753 m)   Wt 154.223 kg   BMI 50.19 kg/m2   SpO2 94% If your blood pressure (BP) was elevated above 135/85 this visit, please have this repeated by your doctor within one month. ---------------

## 2015-11-11 NOTE — ED Notes (Signed)
Pt. Placed in Wheel chair. Paramedic and myself wheeled pt. To the front entrance of the ED.  Was beginning to assist pt. Into his car with his wife present. Pt. Became diaphoretic, pale , gray and stated, "I feel like I am going to pass out."  Pt. Brought directly back to Pod A in the Hallway , placed in a stretcher . Informed Audry Piliyler Mohr, PA    Pt. Is alert and oriented. Janie, RN is assisting in helping with vitals  Cold wash rags applied to pt.s back of his neck and forehead.

## 2015-11-11 NOTE — ED Notes (Signed)
Pt. Is having increased rt. Knee pain after having CT scan, informed Audry Piliyler Mohr, PA.  Pt. Will be going home with prescriptions for pain

## 2015-11-20 ENCOUNTER — Other Ambulatory Visit: Payer: Self-pay | Admitting: Orthopaedic Surgery

## 2015-11-20 DIAGNOSIS — M25561 Pain in right knee: Secondary | ICD-10-CM

## 2015-11-29 ENCOUNTER — Ambulatory Visit
Admission: RE | Admit: 2015-11-29 | Discharge: 2015-11-29 | Disposition: A | Discharge status: Home / Self Care | Payer: 59 | Source: Ambulatory Visit | Attending: Orthopaedic Surgery | Admitting: Orthopaedic Surgery

## 2015-11-29 DIAGNOSIS — M25561 Pain in right knee: Secondary | ICD-10-CM

## 2015-12-06 ENCOUNTER — Ambulatory Visit (HOSPITAL_COMMUNITY): Payer: 59 | Attending: Orthopaedic Surgery | Admitting: Physical Therapy

## 2015-12-06 ENCOUNTER — Encounter (HOSPITAL_COMMUNITY): Payer: Self-pay | Admitting: Physical Therapy

## 2015-12-06 DIAGNOSIS — M6281 Muscle weakness (generalized): Secondary | ICD-10-CM | POA: Diagnosis present

## 2015-12-06 DIAGNOSIS — R2689 Other abnormalities of gait and mobility: Secondary | ICD-10-CM

## 2015-12-06 DIAGNOSIS — R29898 Other symptoms and signs involving the musculoskeletal system: Secondary | ICD-10-CM | POA: Insufficient documentation

## 2015-12-06 DIAGNOSIS — R2681 Unsteadiness on feet: Secondary | ICD-10-CM | POA: Insufficient documentation

## 2015-12-06 DIAGNOSIS — M25561 Pain in right knee: Secondary | ICD-10-CM | POA: Diagnosis not present

## 2015-12-07 DIAGNOSIS — M25561 Pain in right knee: Secondary | ICD-10-CM | POA: Diagnosis not present

## 2015-12-07 NOTE — Therapy (Signed)
[facility] [address] [place], [place], [address] Phone: [phone]   Fax:  [phone]  Physical Therapy Evaluation  Patient Details  Name: Sean Murillo MRN: [id number] Date of Birth: 1976-04-23 Referring Provider: Magnus Ivan  Encounter Date: 12/06/2015      PT End of Session - 12/07/15 0659    Visit Number 1   Number of Visits 3   Date for PT Re-Evaluation 01/04/16   Authorization Type [employer]   Authorization Time Period 12/06/15 to 01/04/16   PT Start Time 1305   PT Stop Time 1348   PT Time Calculation (min) 43 min   Activity Tolerance Patient tolerated treatment well   Behavior During Therapy [facility] for tasks assessed/performed      Past Medical History  Diagnosis Date  . PTSD (post-traumatic stress disorder)     Past Surgical History  Procedure Laterality Date  . Hernia repair    . Splenectomy, total    . Mandible surgery    . Cyst removed from right shoulder      There were no vitals filed for this visit.       Subjective Assessment - 12/06/15 1310    Subjective Pt states he got home from work one Saturday back in May ~22nd or possibly the weekend before. He stepping in the yard and his knee buckled. He couldn't sleep because of the pain. Noticed swelling the following morning from his knee down to the ankle, so he went to the hospital. He had an Xray/CT/MRI which found bad sprain and bone bruising. He feels it has gotten better since it happened, but he feels like his knee isn't very stable when he's walking. He says it still buckles on him when he is up on it more during the day. Denies any popping/clicking/catching.   Pertinent History PTSD   Limitations Walking   How long can you sit comfortably? unlimited    How long can you stand comfortably? unsure   How long can you walk comfortably? unsure as he has just been walking around the house and to the car.    Diagnostic tests CT scan, MRI, Xray: knee sprain and bone  bruise on the posterior aspect of the patella   Patient Stated Goals decrease pain   Currently in Pain? Yes   Pain Score 5    Pain Location Knee   Pain Orientation Right  above and below the knee cap   Pain Descriptors / Indicators Aching  sharp pain with increased activity   Pain Type Acute pain   Pain Radiating Towards none   Pain Onset 1 to 4 weeks ago   Pain Frequency Intermittent   Aggravating Factors  walking   Pain Relieving Factors rest, tylenol, ice 20 min/1x a day   Effect of Pain on Daily Activities unable to return to work            [facility] PT Assessment - 12/07/15 0001    Assessment   Medical Diagnosis Rt knee sprain/contusion   Referring Provider Magnus Ivan   Onset Date/Surgical Date 11/10/15   Next MD Visit 01/02/16   Prior Therapy none   Precautions   Precautions None   Restrictions   Weight Bearing Restrictions No   Balance Screen   Has the patient fallen in the past 6 months Yes   How many times? 2  same day of the injury   Has the patient had a decrease in activity level because of a fear of falling?  No   Is the patient reluctant to leave their home because of a fear of falling?  No   Home [employer] information [employer] manager residence   Additional Comments 5 STE with handrails   Prior Function   Level of Independence Independent   Cognition   Overall Cognitive Status Within Functional Limits for tasks assessed   Observation/Other Assessments   Observations No bruising, minimal swelling located around the R patella   Focus on Therapeutic Outcomes (FOTO)  64% limited    Sensation   Light Touch Appears Intact   AROM   AROM Assessment Site Knee  B knee flexion/extension PROM symmetrical; Rt pain limited   Strength   Right Hip Flexion 5/5   Right Hip Extension 4+/5   Right Hip ABduction 4-/5   Left Hip Flexion 5/5   Left Hip Extension 4+/5   Left Hip ABduction 5/5   Right Knee Flexion 4-/5   Right Knee Extension 4-/5   Left Knee Flexion  5/5   Left Knee Extension 5/5   Right Ankle Dorsiflexion 5/5   Left Ankle Dorsiflexion 5/5   Palpation   Palpation comment R knee anterolateral joint line, patellar tendon   Special Tests    Special Tests Knee Special Tests   Knee Special tests  Patellofemoral Grind Test (Clarke's Sign);other;other2   Patellofemoral Grind test (Clark's Sign)   Findings Postive   Side  Right   other    Findings Positive   Side  Right   Comments Knee valgus testing   other   findings Negative   Side Right   Comments Mild laxity with lachman's testing comparing Rt to Lt; (+) thessaly's test along Lateral joint line   Transfers   Five time sit to stand comments  15.26   Ambulation/Gait   Ambulation/Gait Yes   Stairs Yes   Stair Management Technique Two rails;Step to pattern  Rt step to ascending/Rt step to descending   Gait Comments antalgic gait, decreased stance time on Lt                            PT Education - 12/07/15 0658    Education provided Yes   Education Details reviewed eval findings/POC; discussed ice/elevation parameters; initiated/reviewed HEP; use of axillary crutch to improve antalgic gait mechanics    Person(s) Educated Patient   Methods Explanation;Demonstration;Handout   Comprehension Verbalized understanding;Returned demonstration          PT Short Term Goals - 12/07/15 0704    PT SHORT TERM GOAL #1   Title Pt will demo consistency and independence with HEP   Time 2   Period Weeks   Status New   PT SHORT TERM GOAL #2   Title Pt will report no greater than 3/10 pain during the day to improve his tolerance of ADLs   Time 2   Period Weeks   Status New   PT SHORT TERM GOAL #3   Title Pt will demo correct sit to stand technique without increased weight shift to the Lt.    Time 2   Period Weeks   Status New   PT SHORT TERM GOAL #4   Title Pt will demo correct use of 1 axillary crutch with correct sequencing to decrease antalgic gate.    Time  2   Period Weeks   Status New           PT Long Term Goals - 12/07/15  1610    PT LONG TERM GOAL #1   Title Pt will report no greater than 1 or 2/10 pain rating to allow him to return to work.   Time 4   Period Weeks   Status New   PT LONG TERM GOAL #2   Title Pt will demo improve BLE strength to atleast 5/5 MMT to improve his functional mobility.   Time 4   Period Weeks   Status New   PT LONG TERM GOAL #3   Title Pt will demo reciprocal pattern when ascending/descending stairs and minimal use of handrails x3 trials.    Time 4   Period Weeks   Status New               Plan - 2015-12-22 0701    Clinical Impression Statement Pt is a 40yo M referred to OPPT with Rt knee pain after stepping in a hole in his yard ~3 weeks ago. According to the pt, Xray/CT/MRI imaging are all negative for significant soft tissue pathology or fracture at this time, showing a bone bruise and strain of the knee. He denies numbness/tingling, bruising, popping, clicking, locking but he does note occasional buckling with increased activity, primarily due to pain. Examination reveals some swelling, tenderness to palpation, pain limited strength and AROM deficits of the RLE, as well as antalgic gait pattern and increased difficulty with functional activity such as stair negotiation and sit to stand. Testing for ACL/LCL/PCL was negative, but pt did report tenderness with MCL and meniscus testing. Although imaging was negative for meniscus pathology, we will continue to monitor this throughout his POC. Therapist educated pt on the importance of correct technique with sit to stand and encouraged him to use 1 axillary crutch when noticing increased pain during the day to improve his gait pattern and mechanics. Pt able to demonstrate correct use of axillary crutch and was able to walk with improved step length/posture throughout the gym. Provided him with a gentle HEP to preserve strength and ROM and encouraged him to  continue applying ice at home to manage swelling. Pt would benefit from several more visits of physical therapy to address the above impairments. He is in agreement with proposed PT POC and frequency.     Rehab Potential Good   PT Frequency Other (comment)  every other week   PT Duration 4 weeks   PT Treatment/Interventions ADLs/Self Care Home Management;Cryotherapy;Gait training;Stair training;Functional mobility training;Therapeutic activities;Patient/family education;Neuromuscular re-education;Balance training;Therapeutic exercise;Manual techniques;Passive range of motion;Taping   PT Next Visit Plan reassess stairs; update LE strengthening therex for home (addition of theraband and more functional strengthening)   PT Home Exercise Plan LAQ, hamstring curls, sit to stand with awareness of equal weight shift; ice 3-4x/day with elevation   Recommended Other Services None   Consulted and Agree with Plan of Care Patient      Patient will benefit from skilled therapeutic intervention in order to improve the following deficits and impairments:  Abnormal gait, Decreased activity tolerance, Decreased balance, Decreased mobility, Decreased safety awareness, Difficulty walking, Pain, Improper body mechanics  Visit Diagnosis: Pain in right knee  Muscle weakness (generalized)  Other symptoms and signs involving the musculoskeletal system  Unsteadiness on feet  Other abnormalities of gait and mobility      G-Codes - 2015/12/22 0711    Functional Assessment Tool Used FOTO: 64% limited    Functional Limitation Mobility: Walking and moving around   Mobility: Walking and Moving Around Current Status (R6045) At least 40  percent but less than 60 percent impaired, limited or restricted   Mobility: Walking and Moving Around Goal Status [phone]([id number]) At least 1 percent but less than 20 percent impaired, limited or restricted       Problem List Patient Active Problem List   Diagnosis Date Noted  . Morbid  obesity (HCC) 04/05/2014  . Partial small bowel obstruction (HCC) 04/04/2014  . Leukocytosis 04/04/2014    10:01 AM,12/07/2015 Marylyn IshiharaSara Kiser PT, DPT [facility] [phone]  [facility] [address] [place], [place], [address] Phone: [phone]   Fax:  [phone]  Name: Sean BenderMichael Murillo MRN: [id number] Date of Birth: 03/03/1976

## 2015-12-17 ENCOUNTER — Ambulatory Visit (HOSPITAL_COMMUNITY): Payer: 59 | Admitting: Physical Therapy

## 2015-12-17 ENCOUNTER — Telehealth (HOSPITAL_COMMUNITY): Payer: Self-pay | Admitting: Physical Therapy

## 2015-12-17 NOTE — Telephone Encounter (Signed)
Pt did not show for appt.  Called and left message on voice mail regarding missed appt and next appt on 7/5 at 8:15am Lurena Nidamy B Redding Cloe, PTA/CLT [phone]

## 2016-01-02 ENCOUNTER — Telehealth (HOSPITAL_COMMUNITY): Payer: Self-pay | Admitting: Physical Therapy

## 2016-01-02 ENCOUNTER — Ambulatory Visit (HOSPITAL_COMMUNITY): Payer: 59 | Attending: Orthopaedic Surgery | Admitting: Physical Therapy

## 2016-01-02 NOTE — Telephone Encounter (Signed)
Pt did not show for therapy, has not returned since initial evaluation approx a month ago.  Left message regarding discharge and need to return to MD if he wished to resume therapy (per our policy). Lurena NidaAmy B Prestin Munch, PTA/CLT [phone]

## 2016-01-03 ENCOUNTER — Ambulatory Visit (HOSPITAL_COMMUNITY): Payer: 59 | Admitting: Physical Therapy

## 2016-01-08 ENCOUNTER — Telehealth (HOSPITAL_COMMUNITY): Payer: Self-pay

## 2016-01-08 NOTE — Telephone Encounter (Signed)
01/08/16  Left a message trying to schedule him for therapy.... He has a new referral since he was discharged earlier in the month.

## 2016-01-20 ENCOUNTER — Encounter (HOSPITAL_COMMUNITY): Payer: Self-pay | Admitting: Emergency Medicine

## 2016-01-20 ENCOUNTER — Emergency Department (HOSPITAL_COMMUNITY)
Admission: EM | Admit: 2016-01-20 | Discharge: 2016-01-20 | Disposition: A | Discharge status: Home / Self Care | Payer: 59 | Attending: Emergency Medicine | Admitting: Emergency Medicine

## 2016-01-20 DIAGNOSIS — M2391 Unspecified internal derangement of right knee: Secondary | ICD-10-CM | POA: Insufficient documentation

## 2016-01-20 DIAGNOSIS — M25561 Pain in right knee: Secondary | ICD-10-CM | POA: Diagnosis present

## 2016-01-20 DIAGNOSIS — Y999 Unspecified external cause status: Secondary | ICD-10-CM | POA: Diagnosis not present

## 2016-01-20 DIAGNOSIS — W1830XA Fall on same level, unspecified, initial encounter: Secondary | ICD-10-CM | POA: Diagnosis not present

## 2016-01-20 DIAGNOSIS — Z79899 Other long term (current) drug therapy: Secondary | ICD-10-CM | POA: Diagnosis not present

## 2016-01-20 DIAGNOSIS — Y939 Activity, unspecified: Secondary | ICD-10-CM | POA: Diagnosis not present

## 2016-01-20 DIAGNOSIS — Y9289 Other specified places as the place of occurrence of the external cause: Secondary | ICD-10-CM | POA: Insufficient documentation

## 2016-01-20 MED ORDER — OXYCODONE-ACETAMINOPHEN 5-325 MG PO TABS: 1.0000 | ORAL_TABLET | Freq: Three times a day (TID) | ORAL | 0 refills | Status: DC | PRN

## 2016-01-20 MED ORDER — NAPROXEN SODIUM 220 MG PO TABS: 220.0000 mg | ORAL_TABLET | Freq: Two times a day (BID) | ORAL | 0 refills | Status: DC | PRN

## 2016-01-20 NOTE — ED Triage Notes (Signed)
Patient stepped out of shower and injured hi right knee this evening , reports pain with mild swelling .

## 2016-01-20 NOTE — Discharge Instructions (Signed)
Please follow up with Dr. Magnus Ivan on [facility] for further evaluation and management of your right knee pain.  It is likely a ligament/tendon injury causing your pain and instability.  Wear knee sleeve for support.  Return if you have any concerns.    Apply a compressive ACE bandage. Rest and elevate the affected painful area.  Apply cold compresses intermittently as needed.  As pain recedes, begin normal activities slowly as tolerated.  Call if symptoms persist.

## 2016-01-20 NOTE — ED Notes (Signed)
One touch pt, see provider note

## 2016-01-20 NOTE — ED Provider Notes (Signed)
[facility]-EMERGENCY DEPT Provider Note   CSN: [id number] Arrival date & time: 01/20/16  2235  First Provider Contact:  None     By signing my name below, I, [facility], attest that this documentation has been prepared under the direction and in the presence of Fayrene Helper, PA-C. Electronically Signed: Randell Patient, ED Scribe. 01/20/16. 11:01 PM.   History   Chief Complaint Chief Complaint  Patient presents with  . Knee Pain    HPI Sean Murillo is a 40 y.o. male who presents to the Emergency Department complaining of intermittent, 9/10, tight, gradually worsening knee pain onset 2 months ago which has reoccurred. Pt states that he fell 2 months ago after which he was evaluated in the ED and diagnosed with a bruised bone in his knee and advised to follow-up with Dr. Magnus Ivan. He notes that over the past 2 months he has had recurrent episodes of pain in his knee that have gradually increased in severity and frequency, most recently twice in the past week where his knee became tight, felt unstable, and gave out on him. He reports mild swelling to the right knee. Pain is worse with weight bearing and ambulation. Per pt, he has seen Dr. Magnus Ivan in the past and has an appointment with this provider in 3 days. Denies any other symptoms currently.  The history is provided by the patient. No language interpreter was used.    Past Medical History:  Diagnosis Date  . PTSD (post-traumatic stress disorder)     Patient Active Problem List   Diagnosis Date Noted  . Morbid obesity (HCC) 04/05/2014  . Partial small bowel obstruction (HCC) 04/04/2014  . Leukocytosis 04/04/2014    Past Surgical History:  Procedure Laterality Date  . cyst removed from right shoulder    . HERNIA REPAIR    . MANDIBLE SURGERY    . SPLENECTOMY, TOTAL         Home Medications    Prior to Admission medications   Medication Sig Start Date End Date Taking? Authorizing Provider  cholecalciferol  (VITAMIN D) 1000 units tablet Take 2,000 Units by mouth daily.    Historical Provider, MD  naproxen sodium (ANAPROX) 220 MG tablet Take 220 mg by mouth every 12 (twelve) hours as needed (pain).    Historical Provider, MD  oxyCODONE-acetaminophen (PERCOCET/ROXICET) 5-325 MG tablet Take 2 tablets by mouth every 8 (eight) hours as needed for severe pain. 11/11/15   Audry Pili, PA-C  sertraline (ZOLOFT) 50 MG tablet Take 50 mg by mouth daily.    Historical Provider, MD  vitamin B-12 (CYANOCOBALAMIN) 250 MCG tablet Take 250 mcg by mouth daily.    Historical Provider, MD    Family History Family History  Problem Relation Age of Onset  . Stroke Mother     Social History Social History  Substance Use Topics  . Smoking status: Never Smoker  . Smokeless tobacco: Current User    Types: Chew     Comment: working on it  . Alcohol use Yes     Comment: rare     Allergies   Contrast media [iodinated diagnostic agents] and Iohexol   Review of Systems Review of Systems  Musculoskeletal: Positive for arthralgias and joint swelling.  All other systems reviewed and are negative.    Physical Exam Updated Vital Signs BP (!) 161/113 (BP Location: Left Arm)   Pulse 84   Temp 97.9 F (36.6 C)   SpO2 97%   Physical Exam  Constitutional: He is oriented  to person, place, and time. He appears well-developed and well-nourished. No distress.  HENT:  Head: Normocephalic and atraumatic.  Eyes: Conjunctivae are normal.  Neck: Normal range of motion.  Cardiovascular: Normal rate.   Pulmonary/Chest: Effort normal. No respiratory distress.  Musculoskeletal: Normal range of motion. He exhibits tenderness.  Moderate TTP of anterior right knee with positive Ballottement. Patella is located. Normal right knee flexion and extension. Negative posterior and anterior drawer test. Normal varus and valgus.  Neurological: He is alert and oriented to person, place, and time.  Skin: Skin is warm and dry.    Psychiatric: He has a normal mood and affect. His behavior is normal.  Nursing note and vitals reviewed.    ED Treatments / Results   DIAGNOSTIC STUDIES: Oxygen Saturation is 97% on RA, normal by my interpretation.    COORDINATION OF CARE: 10:48 PM R knee pain and instability.  Had recent MRI and also have been evaluated by Dr. Magnus Ivan.  Suspect ligament injury.  Discussed ordering right x-ray and pt declined this imaging today. Will order knee sleeve. Advised pt to follow-up with Dr. Magnus Ivan. Will discharge pt. Discussed treatment plan with pt at bedside and pt agreed to plan.  Procedures Procedures   Medications Ordered in ED Medications - No data to display   Initial Impression / Assessment and Plan / ED Course  I have reviewed the triage vital signs and the nursing notes.  Pertinent labs & imaging results that were available during my care of the patient were reviewed by me and considered in my medical decision making (see chart for details).  Clinical Course    BP (!) 161/113 (BP Location: Left Arm)   Pulse 84   Temp 97.9 F (36.6 C)   SpO2 97%  The patient was noted to be hypertensive today in the emergency department. I have spoken with the patient regarding hypertension and the need for improved management. I instructed the patient to followup with the PCP within 4 days to improve the management of the patient's hypertension. I also counseled the patient regarding the signs and symptoms which would require an emergent visit to an emergency department for hypertensive urgency and/or hypertensive emergency. The patient understood the need for improved hypertensive management.   Final Clinical Impressions(s) / ED Diagnoses   Final diagnoses:  Internal derangement of right knee    New Prescriptions Current Discharge Medication List    I personally performed the services described in this documentation, which was scribed in my presence. The recorded information has  been reviewed and is accurate.       Fayrene Helper, PA-C 01/20/16 1610    Leta Baptist, MD 01/21/16 [phone]

## 2016-01-21 ENCOUNTER — Telehealth (HOSPITAL_COMMUNITY): Payer: Self-pay

## 2016-01-21 ENCOUNTER — Encounter (HOSPITAL_COMMUNITY): Payer: Self-pay

## 2016-01-21 ENCOUNTER — Ambulatory Visit (HOSPITAL_COMMUNITY): Payer: 59

## 2016-01-21 NOTE — Telephone Encounter (Signed)
Something has come up and he has to take care of it today. Will reschedule for Aug 1st.

## 2016-01-29 ENCOUNTER — Ambulatory Visit (HOSPITAL_COMMUNITY): Payer: 59 | Attending: Orthopaedic Surgery | Admitting: Physical Therapy

## 2016-01-29 ENCOUNTER — Encounter (HOSPITAL_COMMUNITY): Payer: Self-pay

## 2016-01-29 DIAGNOSIS — R29898 Other symptoms and signs involving the musculoskeletal system: Secondary | ICD-10-CM | POA: Insufficient documentation

## 2016-01-29 DIAGNOSIS — M6281 Muscle weakness (generalized): Secondary | ICD-10-CM | POA: Insufficient documentation

## 2016-01-29 DIAGNOSIS — R2689 Other abnormalities of gait and mobility: Secondary | ICD-10-CM | POA: Insufficient documentation

## 2016-01-29 DIAGNOSIS — R2681 Unsteadiness on feet: Secondary | ICD-10-CM | POA: Insufficient documentation

## 2016-01-29 DIAGNOSIS — M25561 Pain in right knee: Secondary | ICD-10-CM | POA: Insufficient documentation

## 2016-02-06 ENCOUNTER — Ambulatory Visit (HOSPITAL_COMMUNITY): Payer: 59 | Admitting: Physical Therapy

## 2016-02-06 DIAGNOSIS — R29898 Other symptoms and signs involving the musculoskeletal system: Secondary | ICD-10-CM | POA: Diagnosis present

## 2016-02-06 DIAGNOSIS — M25561 Pain in right knee: Secondary | ICD-10-CM

## 2016-02-06 DIAGNOSIS — M6281 Muscle weakness (generalized): Secondary | ICD-10-CM

## 2016-02-06 DIAGNOSIS — R2689 Other abnormalities of gait and mobility: Secondary | ICD-10-CM

## 2016-02-06 DIAGNOSIS — R2681 Unsteadiness on feet: Secondary | ICD-10-CM | POA: Diagnosis present

## 2016-02-06 NOTE — Therapy (Signed)
[facility] [address] [place], [place], [address] Phone: [phone]   Fax:  [phone]  Physical Therapy Evaluation  Patient Details  Name: Sean Murillo MRN: [id number] Date of Birth: Nov 08, 1975 Referring Provider: Doneen Poisson   Encounter Date: 02/06/2016      PT End of Session - 02/06/16 1206    Visit Number 1   Number of Visits 8   Date for PT Re-Evaluation 03/05/16   Authorization Type UHC   Authorization Time Period 02/06/16 to 03/08/16   PT Start Time 1115   PT Stop Time 1150   PT Time Calculation (min) 35 min   Activity Tolerance Patient tolerated treatment well   Behavior During Therapy [facility] for tasks assessed/performed      Past Medical History:  Diagnosis Date  . PTSD (post-traumatic stress disorder)     Past Surgical History:  Procedure Laterality Date  . cyst removed from right shoulder    . HERNIA REPAIR    . MANDIBLE SURGERY    . SPLENECTOMY, TOTAL      There were no vitals filed for this visit.       Subjective Assessment - 02/06/16 1117    Subjective Patient reports that back in May his knee gave out when he was getting out of the car, he fell on it and limped back into the house; at 3am that morning he couldn't get to sleep and went to ER/orthopedist who took off fluid and did steriod shots. He states that at his most recent fall, he was washing his trunk and his foot slipped and he hit the ground again, also fell on his knee when he fell stepping out of the shower. His falls are due to knee buckling. He reports that his MRI in June came back fine, has not had other imaging done. While he was walking around his truck this morning, his knee buckled again but he did not fall. It has been popping, but no catching or locking.    Pertinent History PTSD   How long can you sit comfortably? unlimited    How long can you stand comfortably? not long due to knee pain    How long can you walk comfortably?  not far due to knee pain, has just been walking around house and to car    Diagnostic tests CT scan, MRI, Xray: knee sprain and bone bruise on the posterior aspect of the patella   Patient Stated Goals decrease pain   Currently in Pain? Yes   Pain Score 7    Pain Location Knee   Pain Orientation Right   Pain Descriptors / Indicators Sharp   Pain Type Acute pain   Pain Radiating Towards none    Pain Onset More than a month ago   Pain Frequency Intermittent   Aggravating Factors  walking    Pain Relieving Factors rest, ice    Effect of Pain on Daily Activities pain is causing falls, cannot walk extended distances or work             [facility] PT Assessment - 02/06/16 0001      Assessment   Medical Diagnosis R knee pain    Referring Provider Doneen Poisson    Onset Date/Surgical Date 11/10/15   Next MD Visit Dr. Magnus Ivan in September      Precautions   Precautions None     Restrictions   Weight Bearing Restrictions No     Balance Screen   Has  the patient fallen in the past 6 months Yes   How many times? 4   Has the patient had a decrease in activity level because of a fear of falling?  No   Is the patient reluctant to leave their home because of a fear of falling?  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[employer]; physical job with 8 hour shifts    Leisure fishing     Observation/Other Assessments   Focus on Therapeutic Outcomes (FOTO)  70% limited      AROM   Right Knee Extension 0   Right Knee Flexion 112  pain limited      Strength   Right Hip Flexion 3+/5   Right Hip Extension 3+/5   Right Hip ABduction 4/5   Left Hip Flexion 5/5   Left Hip Extension 4/5   Left Hip ABduction 4+/5   Right Knee Flexion 4-/5   Right Knee Extension 4/5   Left Knee Flexion 5/5   Left Knee Extension 4+/5   Right  Ankle Dorsiflexion 4+/5   Left Ankle Dorsiflexion 5/5     Flexibility   Hamstrings WLF B    Piriformis mod tightness B      Palpation   Palpation comment increased edema R knee, tenderness to distal and proximal patellar tendon with proximal > distal      Step-up/Step Down    Findings Positive   Side  Right;Left   Comments very poor eccentric control and severe knee valgus B, collapse of L knee during test but patient able to recover with min guard x2     other    Findings Positive   Side  Right;Left   Comments MCL/LCL appears mildly lax B, but painful with R testing   ACL/PCL tests appear negative      other   findings Positive   Side Right   Comments thessaly test      Ambulation/Gait   Gait Comments reduced weight bearing R LE, B pronation, reduced gait speed, proximal muscle weakness      6 minute walk test results    Aerobic Endurance Distance Walked 565   Endurance additional comments      High Level Balance   High Level Balance Comments TUG 10.32                           PT Education - 02/06/16 1204    Education provided Yes   Education Details prognosis, POC, HEP; importance of regular compliance with HEP and regular attendance of skilled PT services, [place] policy regarding no-shows    Person(s) Educated Patient   Methods Explanation   Comprehension Verbalized understanding          PT Short Term Goals - 02/06/16 1208      PT SHORT TERM GOAL #1   Title Patient to demonstrate R knee ROM 0-120 degrees in order to improve mobilty and reduce pain    Time 2   Period Weeks   Status New     PT SHORT TERM GOAL #2   Title Paitent to experience pain no more than 3/10 at worst in order to improve QOL and functional task performance    Time 2   Period Weeks   Status New     PT SHORT TERM GOAL #3  Title Patient to be independent in correctly and consistently performing edema control techniques, including elevation, retrograde massage,  and ice, in order to assist in reducing pain and improving independence in effective self care    Time 2   Period Weeks   Status New     PT SHORT TERM GOAL #4   Title Patient to be correctly and consistently performing appropriate HEP, to be updated PRN    Time 2   Period Weeks   Status New           PT Long Term Goals - 02/06/16 1212      PT LONG TERM GOAL #1   Title Patient will demonstrate strength 5/5 in all tested muscle groups in order to reduce pain and assist in return to work    Time 4   Period Weeks   Status New     PT LONG TERM GOAL #2   Title Patient to be able to perform step down with good eccentric control and minimal valgus moment bilaterally, no knee buckling, in order to demonstrate improvement of condition and function    Time 4   Period Weeks   Status New     PT LONG TERM GOAL #3   Title Patient to report no falls due to knee buckling within the past 3 weeks in order to demonstrate improved mobility and function in community    Time 4   Period Weeks   Status New               Plan - 02/06/16 1207    Clinical Impression Statement Patient present with ongoing R knee pain that started after a buckling/fall incident back in May; he reports that since that time he has had multiple falls due to his R knee buckling painfully, with the most recent being while he was stepping out of the shower in late July. Upon examination, patient reveals localized edema, functional muscle weakness, and poor coordination of knee musculature as indicated by severe difficulty and buckling with step down test. Recommend trial of skilled PT services in order to address functional limitations and assist in reaching optimal level of function; provided extensive education to patient regarding importance of compliance with HEP and regularly attending skilled PT services, also regarding [place] policy.    Rehab Potential Good   PT Frequency 2x / week   PT Duration 4 weeks   PT  Treatment/Interventions ADLs/Self Care Home Management;Cryotherapy;Gait training;Stair training;Functional mobility training;Therapeutic activities;Patient/family education;Neuromuscular re-education;Balance training;Therapeutic exercise;Manual techniques;Passive range of motion;Taping   PT Next Visit Plan focus on functional strengthening, eccentric control and muscle coordination, edema control    PT Home Exercise Plan LAQ, hamstring curls with red TB; hamstring stretch. Do not update further due to compliance concerns.    Consulted and Agree with Plan of Care Patient      Patient will benefit from skilled therapeutic intervention in order to improve the following deficits and impairments:  Abnormal gait, Decreased activity tolerance, Decreased balance, Decreased mobility, Decreased safety awareness, Difficulty walking, Pain, Improper body mechanics  Visit Diagnosis: Pain in right knee - Plan: PT plan of care cert/re-cert  Muscle weakness (generalized) - Plan: PT plan of care cert/re-cert  Other symptoms and signs involving the musculoskeletal system - Plan: PT plan of care cert/re-cert  Unsteadiness on feet - Plan: PT plan of care cert/re-cert  Other abnormalities of gait and mobility - Plan: PT plan of care cert/re-cert     Problem List Patient Active Problem  List   Diagnosis Date Noted  . Morbid obesity (HCC) 04/05/2014  . Partial small bowel obstruction (HCC) 04/04/2014  . Leukocytosis 04/04/2014    Nedra HaiKristen Elaine Roanhorse PT, DPT [phone]  [facility] [address] [place], [place], [address] Phone: [phone]   Fax:  [phone]  Name: Sean BenderMichael Murillo MRN: [id number] Date of Birth: 03/25/1976

## 2016-02-06 NOTE — Patient Instructions (Signed)
   LAQ/ Long Arc Quad/ Knee Extension with Band  Sit upright at the edge of a seat with foot looped in elastic band. With control, kick the foot out and up to straighten the knee.   Your knee should be pointed towards the ceiling and in line with your foot. Your foot should be flexed at the ankle and the sole of your foot should not turn in or out. Return to starting position with the same level of control.  Repeat 10 times each side, 2-3 times per day.    ELASTIC BAND - HAMSTRING CURL  While seated and an elastic band attched to your ankle, bend your knee and draw back your foot.  Repeat 10 times each side, 2-3 times per day.    HAMSTRING STRETCH - TABLE, BED OR COUCH  Sit on a raised flat surface where you can prop your affected leg up on it such as a treatment table, couch or bed.    While keeping your knee straight to slightly bent, slowly lean forward and reach your hands towards your foot until a gentle stretch is felt along the back of your knee/thigh. Hold and then return to starting position and repeat.  Hold for 30 seconds and repeat 3 times, twice a day.

## 2016-02-12 ENCOUNTER — Ambulatory Visit (HOSPITAL_COMMUNITY): Payer: 59 | Admitting: Physical Therapy

## 2016-02-12 ENCOUNTER — Telehealth (HOSPITAL_COMMUNITY): Payer: Self-pay

## 2016-02-12 NOTE — Telephone Encounter (Signed)
Wife left a message that he wouldn't be here today

## 2016-02-13 ENCOUNTER — Ambulatory Visit (HOSPITAL_COMMUNITY): Payer: 59

## 2016-02-13 DIAGNOSIS — R2681 Unsteadiness on feet: Secondary | ICD-10-CM

## 2016-02-13 DIAGNOSIS — M25561 Pain in right knee: Secondary | ICD-10-CM | POA: Diagnosis not present

## 2016-02-13 DIAGNOSIS — R2689 Other abnormalities of gait and mobility: Secondary | ICD-10-CM

## 2016-02-13 DIAGNOSIS — R29898 Other symptoms and signs involving the musculoskeletal system: Secondary | ICD-10-CM

## 2016-02-13 DIAGNOSIS — M6281 Muscle weakness (generalized): Secondary | ICD-10-CM

## 2016-02-13 NOTE — Therapy (Signed)
[facility] [address] [place], [place], [address] Phone: [phone]   Fax:  [phone]  Physical Therapy Treatment  Patient Details  Name: Sean Murillo MRN: [id number] Date of Birth: Oct 16, 1975 Referring Provider: Doneen Poisson   Encounter Date: 02/13/2016      PT End of Session - 02/13/16 1316    Visit Number 2   Number of Visits 8   Date for PT Re-Evaluation 03/05/16   Authorization Type UHC   Authorization Time Period 02/06/16 to 03/08/16   PT Start Time 1305   PT Stop Time 1345   PT Time Calculation (min) 40 min   Activity Tolerance Patient tolerated treatment well   Behavior During Therapy [facility] for tasks assessed/performed      Past Medical History:  Diagnosis Date  . PTSD (post-traumatic stress disorder)     Past Surgical History:  Procedure Laterality Date  . cyst removed from right shoulder    . HERNIA REPAIR    . MANDIBLE SURGERY    . SPLENECTOMY, TOTAL      There were no vitals filed for this visit.      Subjective Assessment - 02/13/16 1304    Subjective Pt reports sharp Rt knee pain on medial aspect.  Reports knee does pop during gait.  No reports of recent falls, has slid on shoes.   Pertinent History PTSD   Patient Stated Goals decrease pain   Currently in Pain? Yes   Pain Score 5    Pain Location Knee   Pain Orientation Right   Pain Descriptors / Indicators Sharp   Pain Type Acute pain   Pain Radiating Towards none   Pain Onset More than a month ago   Pain Frequency Intermittent   Aggravating Factors  walking   Pain Relieving Factors rest, ice   Effect of Pain on Daily Activities pain is causing falls, cannot walk extended distances or work.             [facility] Adult PT Treatment/Exercise - 02/13/16 0001      Exercises   Exercises Knee/Hip     Knee/Hip Exercises: Stretches   Active Hamstring Stretch Both;3 reps;30 seconds   Active Hamstring Stretch Limitations 1 set seated  like HEP; 2 with standing 12in step     Knee/Hip Exercises: Standing   Heel Raises 15 reps   Lateral Step Up Right;10 reps;Hand Hold: 2;Step Height: 4"   Forward Step Up Right;10 reps;Hand Hold: 1;Step Height: 4";Step Height: 6"   Forward Step Up Limitations 4 then 6in step   Functional Squat 10 reps   Functional Squat Limitations cueing for form     Knee/Hip Exercises: Seated   Long Arc Quad Right;15 reps   Long Arc Quad Limitations RTB   Hamstring Curl 15 reps   Hamstring Limitations RTB     Manual Therapy   Manual Therapy Edema management   Manual therapy comments Manual technqiues complete separate rest of tx   Edema Management Retro massage with LE elevated                  PT Short Term Goals - 02/06/16 1208      PT SHORT TERM GOAL #1   Title Patient to demonstrate R knee ROM 0-120 degrees in order to improve mobilty and reduce pain    Time 2   Period Weeks   Status New     PT SHORT TERM GOAL #2   Title Paitent to experience  pain no more than 3/10 at worst in order to improve QOL and functional task performance    Time 2   Period Weeks   Status New     PT SHORT TERM GOAL #3   Title Patient to be independent in correctly and consistently performing edema control techniques, including elevation, retrograde massage, and ice, in order to assist in reducing pain and improving independence in effective self care    Time 2   Period Weeks   Status New     PT SHORT TERM GOAL #4   Title Patient to be correctly and consistently performing appropriate HEP, to be updated PRN    Time 2   Period Weeks   Status New           PT Long Term Goals - 02/06/16 1212      PT LONG TERM GOAL #1   Title Patient will demonstrate strength 5/5 in all tested muscle groups in order to reduce pain and assist in return to work    Time 4   Period Weeks   Status New     PT LONG TERM GOAL #2   Title Patient to be able to perform step down with good eccentric control and  minimal valgus moment bilaterally, no knee buckling, in order to demonstrate improvement of condition and function    Time 4   Period Weeks   Status New     PT LONG TERM GOAL #3   Title Patient to report no falls due to knee buckling within the past 3 weeks in order to demonstrate improved mobility and function in community    Time 4   Period Weeks   Status New               Plan - 02/13/16 1316    Clinical Impression Statement Reviewed goals, compliance and assured correct technqiue with HEP and copy of eval given to pt.  Session focus on improving functional strengthening to improve eccentric control with min verbal cueing for correct form.  Noted knee popping during initial squat with reports of increased pain following CKC to 7/10.  Good patella mobility noted.  Ended session with retro massage for edema and pain control.  Pt reports decreased pain scale to 4/10 at EOS.     Rehab Potential Good   PT Frequency 2x / week   PT Duration 4 weeks   PT Treatment/Interventions ADLs/Self Care Home Management;Cryotherapy;Gait training;Stair training;Functional mobility training;Therapeutic activities;Patient/family education;Neuromuscular re-education;Balance training;Therapeutic exercise;Manual techniques;Passive range of motion;Taping   PT Next Visit Plan focus on functional strengthening, eccentric control and muscle coordination, edema control    PT Home Exercise Plan Reviewed HEP including: LAQ, hamstring curls with red TB; hamstring stretch. Do not update further due to compliance concerns.       Patient will benefit from skilled therapeutic intervention in order to improve the following deficits and impairments:  Abnormal gait, Decreased activity tolerance, Decreased balance, Decreased mobility, Decreased safety awareness, Difficulty walking, Pain, Improper body mechanics  Visit Diagnosis: Pain in right knee  Muscle weakness (generalized)  Other symptoms and signs involving the  musculoskeletal system  Unsteadiness on feet  Other abnormalities of gait and mobility     Problem List Patient Active Problem List   Diagnosis Date Noted  . Morbid obesity (HCC) 04/05/2014  . Partial small bowel obstruction (HCC) 04/04/2014  . Leukocytosis 04/04/2014   Becky Saxasey Cockerham, LPTA; CBIS [phone]  Juel BurrowCockerham, Casey Jo 02/13/2016, 4:35 PM  [place] Pattricia BossAnnie  [facility] [address] [facility] [facility] [place], [place], [address] Phone: [phone]   Fax:  [phone]  Name: Gwenyth BenderMichael Beswick MRN: [id number] Date of Birth: 06/08/1976

## 2016-02-18 ENCOUNTER — Telehealth (HOSPITAL_COMMUNITY): Payer: Self-pay | Admitting: Physical Therapy

## 2016-02-18 NOTE — Telephone Encounter (Signed)
Pt called asking what are his restriction. Advised pt that restrictions would come from his MD. Please call if you have any information that you think the pateint needs to know.

## 2016-02-20 ENCOUNTER — Ambulatory Visit (HOSPITAL_COMMUNITY): Payer: 59 | Admitting: Physical Therapy

## 2016-02-22 ENCOUNTER — Ambulatory Visit (HOSPITAL_COMMUNITY): Payer: 59 | Admitting: Physical Therapy

## 2016-02-22 DIAGNOSIS — R2689 Other abnormalities of gait and mobility: Secondary | ICD-10-CM

## 2016-02-22 DIAGNOSIS — M25561 Pain in right knee: Secondary | ICD-10-CM | POA: Diagnosis not present

## 2016-02-22 DIAGNOSIS — M6281 Muscle weakness (generalized): Secondary | ICD-10-CM

## 2016-02-22 DIAGNOSIS — R2681 Unsteadiness on feet: Secondary | ICD-10-CM

## 2016-02-22 DIAGNOSIS — R29898 Other symptoms and signs involving the musculoskeletal system: Secondary | ICD-10-CM

## 2016-02-22 NOTE — Therapy (Addendum)
[place], [place], [address] Phone: [phone]   Fax:  [phone]  Physical Therapy Treatment/Discharge  Patient Details  Name: Sean Murillo MRN: [id number] Date of Birth: 10/12/1975 Referring Provider: Jean Rosenthal   Encounter Date: 02/22/2016      PT End of Session - 02/22/16 1726    Visit Number 3   Number of Visits 8   Date for PT Re-Evaluation 03/05/16   Authorization Type [employer]   Authorization Time Period 02/06/16 to 03/08/16   PT Start Time 1644   PT Stop Time 1726   PT Time Calculation (min) 42 min   Activity Tolerance Patient tolerated treatment well   Behavior During Therapy [facility] for tasks assessed/performed      Past Medical History:  Diagnosis Date  . PTSD (post-traumatic stress disorder)     Past Surgical History:  Procedure Laterality Date  . cyst removed from right shoulder    . HERNIA REPAIR    . MANDIBLE SURGERY    . SPLENECTOMY, TOTAL      There were no vitals filed for this visit.      Subjective Assessment - 02/22/16 1646    Subjective Pt reports that his knee is doing good right now. Only minimal pain. He has been doing his exercises regularly.   Pertinent History PTSD   Patient Stated Goals decrease pain   Currently in Pain? No/denies   Pain Onset More than a month ago                         [facility] Adult PT Treatment/Exercise - 02/22/16 0001      Knee/Hip Exercises: Standing   Heel Raises 20 reps;2 sets   Heel Raises Limitations RLE only    Forward Lunges 1 set;Right;10 reps   Forward Lunges Limitations 4" box    Forward Step Up Right;2 sets;20 reps;Hand Hold: 2;Step Height: 6"   Forward Step Up Limitations 6" step up    Step Down 2 sets;15 reps;Step Height: 4";Hand Hold: 2;Right   Step Down Limitations lateral    Functional Squat 2 sets;20 reps   Functional Squat Limitations slow eccentric lowering    Other Standing Knee Exercises partial  lunge on foam pad, RLE forward 2x10 reps. BUE support   cues to push through heel     Knee/Hip Exercises: Prone   Hamstring Curl 1 set;20 reps   Hamstring Curl Limitations green TB                PT Education - 02/22/16 1725    Education provided Yes   Education Details POC; updated HEP; technique with therex and importance of moving through HEP with slow and controlled motions   Person(s) Educated Patient   Methods Explanation;Handout;Verbal cues;Demonstration   Comprehension Verbalized understanding;Returned demonstration          PT Short Term Goals - 02/06/16 1208      PT SHORT TERM GOAL #1   Title Patient to demonstrate R knee ROM 0-120 degrees in order to improve mobilty and reduce pain    Time 2   Period Weeks   Status New     PT SHORT TERM GOAL #2   Title Paitent to experience pain no more than 3/10 at worst in order to improve QOL and functional task performance    Time 2   Period Weeks   Status New     PT SHORT TERM GOAL #3  Title Patient to be independent in correctly and consistently performing edema control techniques, including elevation, retrograde massage, and ice, in order to assist in reducing pain and improving independence in effective self care    Time 2   Period Weeks   Status New     PT SHORT TERM GOAL #4   Title Patient to be correctly and consistently performing appropriate HEP, to be updated PRN    Time 2   Period Weeks   Status New           PT Long Term Goals - 02/06/16 1212      PT LONG TERM GOAL #1   Title Patient will demonstrate strength 5/5 in all tested muscle groups in order to reduce pain and assist in return to work    Time 4   Period Weeks   Status New     PT LONG TERM GOAL #2   Title Patient to be able to perform step down with good eccentric control and minimal valgus moment bilaterally, no knee buckling, in order to demonstrate improvement of condition and function    Time 4   Period Weeks   Status New      PT LONG TERM GOAL #3   Title Patient to report no falls due to knee buckling within the past 3 weeks in order to demonstrate improved mobility and function in community    Time 4   Period Weeks   Status New               Plan - 20-Mar-2016 1726    Clinical Impression Statement Pt arrived today without report of pain and overall feeling he has made huge improvements. Session focused on progressions of functional strengthening with verbal cues provided occasionally to improve technique. Pt ended session without report of pain and with only one occasion of Rt knee pain with deep lunge. Decreased frequency to 1x/week for the remainder of his POC and updated HEP to reflect gains in strength. Pt verbalized agreement with this.    Rehab Potential Good   PT Frequency 2x / week   PT Duration 4 weeks   PT Treatment/Interventions ADLs/Self Care Home Management;Cryotherapy;Gait training;Stair training;Functional mobility training;Therapeutic activities;Patient/family education;Neuromuscular re-education;Balance training;Therapeutic exercise;Manual techniques;Passive range of motion;Taping   PT Next Visit Plan focus on functional strengthening, eccentric control and muscle coordination, edema control    PT Home Exercise Plan updated HEP: step down, sit to stand and straight leg bridge   Consulted and Agree with Plan of Care Patient      Patient will benefit from skilled therapeutic intervention in order to improve the following deficits and impairments:  Abnormal gait, Decreased activity tolerance, Decreased balance, Decreased mobility, Decreased safety awareness, Difficulty walking, Pain, Improper body mechanics  Visit Diagnosis: Pain in right knee  Muscle weakness (generalized)  Other symptoms and signs involving the musculoskeletal system  Unsteadiness on feet  Other abnormalities of gait and mobility       G-Codes - 2016-03-20 1729    Functional Assessment Tool Used clinical  judgement based on assessment of functional strenght and mobility   Functional Limitation Mobility: Walking and moving around   Mobility: Walking and Moving Around Current Status (M7672) At least 20 percent but less than 40 percent impaired, limited or restricted   Mobility: Walking and Moving Around Goal Status (C9470) At least 1 percent but less than 20 percent impaired, limited or restricted      Problem List Patient Active Problem List  Diagnosis Date Noted  . Morbid obesity ([place]) 04/05/2014  . Partial small bowel obstruction ([place]) 04/04/2014  . Leukocytosis 04/04/2014    5:51 PM,02/22/16 Elly Modena PT, DPT [facility] [place] [address] [place], [place], [address] Phone: [phone]   Fax:  [phone]  Name: Sean Murillo MRN: [id number] Date of Birth: March 30, 1976    *addendum to d/c pt and close episode of care  PHYSICAL THERAPY DISCHARGE SUMMARY  Visits from Start of Care: 3  Current functional level related to goals / functional outcomes: Improved strength, balance and activity tolerance.  See most recent treatment note for more details.   Remaining deficits: Pt reporting continued swelling, no other issues reported   Education / Equipment: Updated HEP during POC. Dis  Pt being discharged today due to 3 no shows, 1 canceled session since his last visit on 02/22/16. Spoke with pt regarding office attendance policy and he verbalized understanding. Reported his only issue now is some swelling with activity. Will sign off at this time.  Plan: Patient agrees to discharge.  Patient goals were not met. Patient is being discharged due to not returning since the last visit.  ?????     3:50 PM,03/24/16 [facility] [phone]

## 2016-02-26 ENCOUNTER — Ambulatory Visit (HOSPITAL_COMMUNITY): Payer: 59

## 2016-02-28 ENCOUNTER — Ambulatory Visit (HOSPITAL_COMMUNITY): Payer: 59 | Admitting: Physical Therapy

## 2016-02-28 ENCOUNTER — Telehealth (HOSPITAL_COMMUNITY): Payer: Self-pay | Admitting: Physical Therapy

## 2016-02-28 NOTE — Telephone Encounter (Signed)
Pt did not show for appointment.  Attempted to call number provided, however number is disconnected.  Pt without any other numbers given. Lurena NidaAmy B Jenniefer Salak, PTA/CLT [phone]

## 2016-03-04 ENCOUNTER — Encounter (HOSPITAL_COMMUNITY): Payer: 59 | Admitting: Physical Therapy

## 2016-03-06 ENCOUNTER — Encounter (HOSPITAL_COMMUNITY): Payer: Self-pay | Admitting: Emergency Medicine

## 2016-03-06 ENCOUNTER — Ambulatory Visit (HOSPITAL_COMMUNITY): Payer: 59 | Attending: Orthopaedic Surgery | Admitting: Physical Therapy

## 2016-03-06 ENCOUNTER — Emergency Department (HOSPITAL_COMMUNITY)
Admission: EM | Admit: 2016-03-06 | Discharge: 2016-03-06 | Disposition: A | Discharge status: Home / Self Care | Payer: 59 | Attending: Emergency Medicine | Admitting: Emergency Medicine

## 2016-03-06 ENCOUNTER — Telehealth (HOSPITAL_COMMUNITY): Payer: Self-pay | Admitting: Physical Therapy

## 2016-03-06 DIAGNOSIS — M545 Low back pain, unspecified: Secondary | ICD-10-CM

## 2016-03-06 DIAGNOSIS — Y999 Unspecified external cause status: Secondary | ICD-10-CM | POA: Diagnosis not present

## 2016-03-06 DIAGNOSIS — M62830 Muscle spasm of back: Secondary | ICD-10-CM

## 2016-03-06 DIAGNOSIS — Z791 Long term (current) use of non-steroidal anti-inflammatories (NSAID): Secondary | ICD-10-CM | POA: Insufficient documentation

## 2016-03-06 DIAGNOSIS — S39012A Strain of muscle, fascia and tendon of lower back, initial encounter: Secondary | ICD-10-CM | POA: Insufficient documentation

## 2016-03-06 DIAGNOSIS — F17220 Nicotine dependence, chewing tobacco, uncomplicated: Secondary | ICD-10-CM | POA: Diagnosis not present

## 2016-03-06 DIAGNOSIS — T148XXA Other injury of unspecified body region, initial encounter: Secondary | ICD-10-CM

## 2016-03-06 DIAGNOSIS — Y9389 Activity, other specified: Secondary | ICD-10-CM | POA: Insufficient documentation

## 2016-03-06 DIAGNOSIS — S3992XA Unspecified injury of lower back, initial encounter: Secondary | ICD-10-CM | POA: Diagnosis present

## 2016-03-06 DIAGNOSIS — Z79899 Other long term (current) drug therapy: Secondary | ICD-10-CM | POA: Diagnosis not present

## 2016-03-06 DIAGNOSIS — X500XXA Overexertion from strenuous movement or load, initial encounter: Secondary | ICD-10-CM | POA: Diagnosis not present

## 2016-03-06 DIAGNOSIS — Y929 Unspecified place or not applicable: Secondary | ICD-10-CM | POA: Diagnosis not present

## 2016-03-06 MED ORDER — NAPROXEN 500 MG PO TABS: 500.0000 mg | ORAL_TABLET | Freq: Two times a day (BID) | ORAL | 0 refills | Status: DC

## 2016-03-06 MED ORDER — CYCLOBENZAPRINE HCL 10 MG PO TABS: 10.0000 mg | ORAL_TABLET | Freq: Three times a day (TID) | ORAL | 0 refills | Status: DC | PRN

## 2016-03-06 MED ORDER — KETOROLAC TROMETHAMINE 30 MG/ML IJ SOLN
30.0000 mg | Freq: Once | INTRAMUSCULAR | Status: AC
  Administered 2016-03-06: 30 mg via INTRAMUSCULAR
  Filled 2016-03-06: qty 1

## 2016-03-06 NOTE — Discharge Instructions (Signed)
Back Pain: Your back pain should be treated with medicines such as ibuprofen or aleve and this back pain should get better over the next 2 weeks.  However if you develop severe or worsening pain, low back pain with fever, numbness, weakness or inability to walk or urinate, you should return to the ER immediately.  Please follow up with your doctor this week for a recheck if still having symptoms.  Avoid heavy lifting over 10 pounds over the next two weeks.  Low back pain is discomfort in the lower back that may be due to injuries to muscles and ligaments around the spine.  Occasionally, it may be caused by a a problem to a part of the spine called a disc.  The pain may last several days or a week;  However, most patients get completely well in 4 weeks.  Self - care:  The application of heat can help soothe the pain.  Maintaining your daily activities, including walking, is encourged, as it will help you get better faster than just staying in bed. Perform gentle stretching as discussed. Drink plenty of fluids.  Medications are also useful to help with pain control.  A commonly prescribed medication includes tylenol, use as directed on the bottle.  Non steroidal anti inflammatory medications including Ibuprofen and naproxen;  These medications help both pain and swelling and are very useful in treating back pain.  They should be taken with food, as they can cause stomach upset, and more seriously, stomach bleeding.    Muscle relaxants:  These medications can help with muscle tightness that is a cause of lower back pain.  Most of these medications can cause drowsiness, and it is not safe to drive or use dangerous machinery while taking them.  Massage: use a tennis ball or other spherical object to apply gentle pressure for 20-30 seconds over the painful area, then release, and repeat this a few times daily to help relax the muscle spasms.   SEEK IMMEDIATE MEDICAL ATTENTION IF: New numbness, tingling,  weakness, or problem with the use of your arms or legs.  Severe back pain not relieved with medications.  Difficulty with or loss of control of your bowel or bladder control.  Increasing pain in any areas of the body (such as chest or abdominal pain).  Shortness of breath, dizziness or fainting.  Nausea (feeling sick to your stomach), vomiting, fever, or sweats.  You will need to follow up with  Your primary healthcare provider in 1-2 weeks for reassessment.

## 2016-03-06 NOTE — ED Provider Notes (Signed)
[facility]-EMERGENCY DEPT Provider Note   CSN: [id number] Arrival date & time: 03/06/16  1522  By signing my name below, I, [name], attest that this documentation has been prepared under the direction and in the presence of [name], [employer]F CorporationPA-C Electronically Signed: [name], ED Scribe. 03/06/16. 4:30 PM.   History   Chief Complaint Chief Complaint  Patient presents with  . Back Pain    increased pain over last few days    HPI  Sean BenderMichael Murillo is a 40 y.o. male with a PMHx of remote back injury/pain, who presents to the Emergency department complaining of right lower back pain onset 3 days ago. Pt notes that his back pain started after he was lifting a heavy item with a friend and the friend suddenly dropped his end causing the pt to be lifting majority of the weight. Pt describes his right lower back pain as 10/10, intermittent, sharp, and it does radiate to posterior right thigh. He states that ambulation and standing from seated position worsens his right lower back pain. Pt right lower back pain is alleviated with sitting down. He reports that he has tried motrin with no relief for his symptoms. Pt denies fever, chills, CP, SOB, abdominal pain, nausea, vomiting, diarrhea, constipation, dysuria, hematuria, bowel/bladder incontinence, saddle paresthesia or cauda equina symptoms, numbness, tingling, weakness, and any other symptoms. Denies hx of CA or IV drug use. States he injured his back many years ago when he was in [employer], and has had some issues every since.     The history is provided by the patient. No language interpreter was used.  Back Pain   This is a chronic problem. The current episode started more than 2 days ago (3 days). The problem occurs constantly. The problem has not changed since onset.The pain is associated with lifting heavy objects. The pain is present in the lumbar spine. Quality: sharp. The pain radiates to the right thigh. The pain is at a severity  of 10/10. The pain is moderate. Exacerbated by: ambulation and standing from a seated position. The pain is the same all the time. Pertinent negatives include no chest pain, no fever, no numbness, no abdominal pain, no bowel incontinence, no bladder incontinence, no dysuria, no paresthesias, no tingling and no weakness. He has tried NSAIDs (motrin) for the symptoms. The treatment provided no relief. Risk factors include obesity.    Past Medical History:  Diagnosis Date  . PTSD (post-traumatic stress disorder)     Patient Active Problem List   Diagnosis Date Noted  . Morbid obesity ([facility]) 04/05/2014  . Partial small bowel obstruction ([facility]) 04/04/2014  . Leukocytosis 04/04/2014    Past Surgical History:  Procedure Laterality Date  . cyst removed from right shoulder    . HERNIA REPAIR    . MANDIBLE SURGERY    . SPLENECTOMY, TOTAL         Home Medications    Prior to Admission medications   Medication Sig Start Date End Date Taking? Authorizing Provider  cholecalciferol (VITAMIN D) 1000 units tablet Take 2,000 Units by mouth daily.    Historical Provider, MD  naproxen sodium (ANAPROX) 220 MG tablet Take 1 tablet (220 mg total) by mouth every 12 (twelve) hours as needed (pain). 01/20/16   Fayrene HelperBowie Tran, PA-C  oxyCODONE-acetaminophen (PERCOCET/ROXICET) 5-325 MG tablet Take 1 tablet by mouth every 8 (eight) hours as needed for severe pain. 01/20/16   Fayrene HelperBowie Tran, PA-C  sertraline (ZOLOFT) 50 MG tablet Take 50 mg by  mouth daily.    Historical Provider, MD  vitamin B-12 (CYANOCOBALAMIN) 250 MCG tablet Take 250 mcg by mouth daily.    Historical Provider, MD    Family History Family History  Problem Relation Age of Onset  . Stroke Mother     Social History Social History  Substance Use Topics  . Smoking status: Never Smoker  . Smokeless tobacco: Current User    Types: Chew     Comment: working on it  . Alcohol use Yes     Comment: rare     Allergies   Contrast media [iodinated  diagnostic agents] and Iohexol   Review of Systems Review of Systems  Constitutional: Negative for chills and fever.  Respiratory: Negative for shortness of breath.   Cardiovascular: Negative for chest pain.  Gastrointestinal: Negative for abdominal pain, bowel incontinence, constipation, diarrhea, nausea and vomiting.  Genitourinary: Negative for bladder incontinence, difficulty urinating (no incontinence), dysuria and hematuria.  Musculoskeletal: Positive for back pain (right lower).  Skin: Negative for color change.  Allergic/Immunologic: Negative for immunocompromised state.  Neurological: Negative for tingling, weakness, numbness and paresthesias.  Psychiatric/Behavioral: Negative for confusion.   A complete 10 system review of systems was obtained and all systems are negative except as noted in the HPI and PMH.   Physical Exam Updated Vital Signs BP (!) 140/105 (BP Location: Left Arm)   Pulse 89   Temp 98 F (36.7 C) (Oral)   Resp 20   SpO2 96%   Physical Exam  Constitutional: He is oriented to person, place, and time. Vital signs are normal. He appears well-developed and well-nourished.  Non-toxic appearance. No distress.  Afebrile, nontoxic, NAD  HENT:  Head: Normocephalic and atraumatic.  Mouth/Throat: Mucous membranes are normal.  Eyes: Conjunctivae and EOM are normal. Right eye exhibits no discharge. Left eye exhibits no discharge.  Neck: Normal range of motion. Neck supple.  Cardiovascular: Normal rate and intact distal pulses.   Pulmonary/Chest: Effort normal. No respiratory distress.  Abdominal: Normal appearance. He exhibits no distension.  Musculoskeletal: Normal range of motion.       Lumbar back: He exhibits tenderness and spasm. He exhibits normal range of motion, no bony tenderness and no deformity.  Lumbar spine with FROM intact without spinous process TTP, no bony stepoffs or deformities, with mild right sided paraspinous muscle TTP and muscle spasms.  Strength 5/5 in all extremities, sensation grossly intact in all extremities, negative SLR bilaterally, gait steady and nonantalgic. No overlying skin changes. Distal pulses intact.  Neurological: He is alert and oriented to person, place, and time. He has normal strength. No sensory deficit.  Skin: Skin is warm, dry and intact. No rash noted.  Psychiatric: He has a normal mood and affect.  Nursing note and vitals reviewed.    ED Treatments / Results  DIAGNOSTIC STUDIES: Oxygen Saturation is 96% on RA, nl by my interpretation.    COORDINATION OF CARE: 4:26 PM Discussed treatment plan with pt at bedside which includes toradol injection, flexeril Rx, naprosyn Rx, gentle massages, use tylenol PRN, follow up with PCP in 1 week, and pt agreed to plan.   Procedures Procedures (including critical care time)  Medications Ordered in ED Medications  ketorolac (TORADOL) 30 MG/ML injection 30 mg (not administered)     Initial Impression / Assessment and Plan / ED Course  I have reviewed the triage vital signs and the nursing notes.   Clinical Course    40 y.o. male here with acute on chronic R  low back pain after heavy lifting. Tenderness to R paraspinous muscles with +spasm. No red flag s/s of low back pain. No s/s of central cord compression or cauda equina. Lower extremities are neurovascularly intact and patient is ambulating without difficulty. No urinary symptoms. Doubt need for imaging or labs  Patient was counseled on back pain precautions and told to do activity as tolerated but do not lift, push, or pull heavy objects more than 10 pounds for the next week. Patient counseled to use ice or heat on back for no longer than 15 minutes every hour.   Rx given for muscle relaxer and counseled on proper use of muscle relaxant medication. Urged patient not to drink alcohol, drive, or perform any other activities that requires focus while taking these medications. Rx given for NSAIDs scheduled  x2 wks. Discussed additional tylenol use as well. Massage of the area discussed.  Patient urged to follow-up with PCP if pain does not improve with treatment and rest or if pain becomes recurrent. Urged to return with worsening severe pain, loss of bowel or bladder control, trouble walking. The patient verbalizes understanding and agrees with the plan.   Final Clinical Impressions(s) / ED Diagnoses   Final diagnoses:  Right-sided low back pain without sciatica  Muscle spasm of back  Muscle strain    New Prescriptions New Prescriptions   CYCLOBENZAPRINE (FLEXERIL) 10 MG TABLET    Take 1 tablet (10 mg total) by mouth 3 (three) times daily as needed for muscle spasms.   NAPROXEN (NAPROSYN) 500 MG TABLET    Take 1 tablet (500 mg total) by mouth 2 (two) times daily with a meal. X 2 weeks   I personally performed the services described in this documentation, which was scribed in my presence. The recorded information has been reviewed and is accurate.     Xitlaly Ault Camprubi-Soms, PA-C 03/06/16 1636    Benjiman Core, MD 03/07/16 0003

## 2016-03-06 NOTE — ED Triage Notes (Signed)
Pt stated that he was lifting a heavy item earlier this week.  He has had back pain since Monday, the pain is radiating down r/leg. Hx of back pain

## 2016-03-06 NOTE — Telephone Encounter (Signed)
Patient a no-show (#2); called and spoke to patient, who states he hurt his back and is about to head to the ER. Educated patient about no-show policy and time/date of next scheduled session.  Nedra HaiKristen Iran Kievit PT, DPT [phone]

## 2016-03-11 NOTE — Telephone Encounter (Signed)
Called and left message requesting pt to change apt to 1:00 vs 1:45 for tomorrows apt on 03/12/2016.  Contact info given.    [address]Tyrell Brereton, LPTA; CBIS [phone]

## 2016-03-12 ENCOUNTER — Encounter (HOSPITAL_COMMUNITY): Payer: 59

## 2016-03-24 ENCOUNTER — Ambulatory Visit (HOSPITAL_COMMUNITY): Payer: 59 | Admitting: Physical Therapy

## 2016-03-24 ENCOUNTER — Telehealth (HOSPITAL_COMMUNITY): Payer: Self-pay | Admitting: Physical Therapy

## 2016-03-24 NOTE — Telephone Encounter (Signed)
No show: Pt reminded of missed appt today. Due to 3 no shows, notified him that he will be discharged from PT at this time. Pt verbalizing understanding and states he feels his knee is much improved. Encouraged him to obtain another order from MD if necessary.  3:41 PM,03/24/16 Marylyn IshiharaSara Kiser PT, DPT [facility] [phone]

## 2016-04-19 ENCOUNTER — Encounter (HOSPITAL_COMMUNITY): Payer: Self-pay | Admitting: *Deleted

## 2016-04-19 ENCOUNTER — Emergency Department (HOSPITAL_COMMUNITY)
Admission: EM | Admit: 2016-04-19 | Discharge: 2016-04-19 | Disposition: A | Discharge status: Home / Self Care | Payer: 59 | Attending: Emergency Medicine | Admitting: Emergency Medicine

## 2016-04-19 DIAGNOSIS — W57XXXA Bitten or stung by nonvenomous insect and other nonvenomous arthropods, initial encounter: Secondary | ICD-10-CM | POA: Diagnosis not present

## 2016-04-19 DIAGNOSIS — Y929 Unspecified place or not applicable: Secondary | ICD-10-CM | POA: Diagnosis not present

## 2016-04-19 DIAGNOSIS — Y939 Activity, unspecified: Secondary | ICD-10-CM | POA: Diagnosis not present

## 2016-04-19 DIAGNOSIS — S40861A Insect bite (nonvenomous) of right upper arm, initial encounter: Secondary | ICD-10-CM | POA: Diagnosis present

## 2016-04-19 DIAGNOSIS — Y999 Unspecified external cause status: Secondary | ICD-10-CM | POA: Insufficient documentation

## 2016-04-19 MED ORDER — DOXYCYCLINE HYCLATE 100 MG PO CAPS: 100.0000 mg | ORAL_CAPSULE | Freq: Two times a day (BID) | ORAL | 0 refills | Status: DC

## 2016-04-19 NOTE — Progress Notes (Signed)
Reddened area to righter upper deltoid area. Pt stated he did remove the tick. (7:05pm)

## 2016-04-19 NOTE — ED Triage Notes (Addendum)
Patient states his wife noticed a tick attached to his upper posterior right arm earlier today.  She removed the tick, but the area is red and somewhat inflamed.  Patient is uncertain as to when the tick bit him.  Patient denies N/V and fever, but states he had one episode of diarrhea last night.  Patient c/o generalized body aches/pains.  Patient denies rash.

## 2016-04-19 NOTE — ED Provider Notes (Signed)
[facility]-EMERGENCY DEPT Provider Note   CSN: [id number] Arrival date & time: 04/19/16  1754  By signing my name below, I, Lennie Muckle, attest that this documentation has been prepared under the direction and in the presence of Buel Ream, PA-C. Electronically Signed: Lennie Muckle, Scribe. 04/19/16. 8:06 PM.   History   Chief Complaint Chief Complaint  Patient presents with  . Insect Bite   The history is provided by the patient and the spouse. No language interpreter was used.     HPI Comments: Cloud Graham is a 40 y.o. male who presents to the Emergency Department complaining of a tick bite on the posterior right upper arm. His wife saw the tick attached to the right arm, removed it, and there is now redness and some swelling in that area. She did not notice that the tick was fully engorged, only mildly so. He is not sure when the tick initially bit him. Denies any rash, fever, nausea, or vomiting. Did have an episode diarrhea last night and has generalized body aches/pains. Has not had any abnormal foods recently and has not had any diarrhea today. He also has a headache with no previous history of headaches. The pain is in the front of his head and throbbing. Has mild stiffness in his neck more so on the right side as of this morning and feels like he just slept wrong.   Past Medical History:  Diagnosis Date  . PTSD (post-traumatic stress disorder)     Patient Active Problem List   Diagnosis Date Noted  . Morbid obesity (HCC) 04/05/2014  . Partial small bowel obstruction 04/04/2014  . Leukocytosis 04/04/2014    Past Surgical History:  Procedure Laterality Date  . cyst removed from right shoulder    . HERNIA REPAIR    . MANDIBLE SURGERY    . SPLENECTOMY, TOTAL         Home Medications    Prior to Admission medications   Medication Sig Start Date End Date Taking? Authorizing Provider  cholecalciferol (VITAMIN D) 1000 units tablet Take 2,000 Units by mouth daily.     Historical Provider, MD  cyclobenzaprine (FLEXERIL) 10 MG tablet Take 1 tablet (10 mg total) by mouth 3 (three) times daily as needed for muscle spasms. 03/06/16   Mercedes Camprubi-Soms, PA-C  doxycycline (VIBRAMYCIN) 100 MG capsule Take 1 capsule (100 mg total) by mouth 2 (two) times daily. 04/19/16   Emi Holes, PA-C  naproxen (NAPROSYN) 500 MG tablet Take 1 tablet (500 mg total) by mouth 2 (two) times daily with a meal. X 2 weeks 03/06/16   Mercedes Camprubi-Soms, PA-C  naproxen sodium (ANAPROX) 220 MG tablet Take 1 tablet (220 mg total) by mouth every 12 (twelve) hours as needed (pain). 01/20/16   Fayrene Helper, PA-C  oxyCODONE-acetaminophen (PERCOCET/ROXICET) 5-325 MG tablet Take 1 tablet by mouth every 8 (eight) hours as needed for severe pain. 01/20/16   Fayrene Helper, PA-C  sertraline (ZOLOFT) 50 MG tablet Take 50 mg by mouth daily.    Historical Provider, MD  vitamin B-12 (CYANOCOBALAMIN) 250 MCG tablet Take 250 mcg by mouth daily.    Historical Provider, MD    Family History Family History  Problem Relation Age of Onset  . Stroke Mother     Social History Social History  Substance Use Topics  . Smoking status: Never Smoker  . Smokeless tobacco: Current User    Types: Chew     Comment: working on it  . Alcohol use No  Comment: rare     Allergies   Contrast media [iodinated diagnostic agents] and Iohexol   Review of Systems Review of Systems  Constitutional: Negative for chills and fever.  Eyes: Negative.   Respiratory: Negative for shortness of breath.   Cardiovascular: Negative for chest pain.  Gastrointestinal: Positive for diarrhea. Negative for abdominal pain, nausea and vomiting.  Musculoskeletal: Positive for myalgias.       Neck stiffness on the right  Skin: Negative for rash.       Tick bite on posterior right arm  Neurological: Positive for headaches.  All other systems reviewed and are negative.    Physical Exam Updated Vital Signs BP (!) 147/111 (BP  Location: Left Arm)   Pulse 82   Temp 97.8 F (36.6 C) (Oral)   Resp 18   Ht 5\' 9"  (1.753 m)   Wt (!) 364 lb (165.1 kg)   SpO2 99%   BMI 53.75 kg/m   Physical Exam  Constitutional: He appears well-developed and well-nourished. No distress.  HENT:  Head: Normocephalic and atraumatic.  Mouth/Throat: Oropharynx is clear and moist. No oropharyngeal exudate.  Eyes: Conjunctivae and EOM are normal. Pupils are equal, round, and reactive to light. Right eye exhibits no discharge. Left eye exhibits no discharge. No scleral icterus.  Neck: Normal range of motion. Neck supple. No thyromegaly present.  Cardiovascular: Normal rate, regular rhythm, normal heart sounds and intact distal pulses.  Exam reveals no gallop and no friction rub.   No murmur heard. Pulmonary/Chest: Effort normal and breath sounds normal. No stridor. No respiratory distress. He has no wheezes. He has no rales.  Abdominal: Soft. Bowel sounds are normal. He exhibits no distension. There is no tenderness. There is no rebound and no guarding.  Musculoskeletal: He exhibits no edema.  Lymphadenopathy:    He has no cervical adenopathy.  Neurological: He is alert. No cranial nerve deficit. Coordination normal.  CN 3-12 intact; normal sensation throughout; 5/5 strength in all 4 extremities; equal bilateral grip strength   Skin: Skin is warm and dry. No rash noted. He is not diaphoretic. No pallor.  Area of erythema noted to posterior upper arm; no drainage; see image  Psychiatric: He has a normal mood and affect.  Nursing note and vitals reviewed.      ED Treatments / Results  Labs (all labs ordered are listed, but only abnormal results are displayed) Labs Reviewed - No data to display  EKG  EKG Interpretation None       Radiology No results found.  Procedures Procedures (including critical care time)  Medications Ordered in ED Medications - No data to display   Initial Impression / Assessment and Plan / ED  Course  I have reviewed the triage vital signs and the nursing notes.  Pertinent labs & imaging results that were available during my care of the patient were reviewed by me and considered in my medical decision making (see chart for details).  Clinical Course    [facility]. Afebrile. No tachycardia, hypotension, or other symptoms suggestive of severe infection. Wound care with antibiotic ointment discussed. Will discharge with Doxycycline. Return precautions discussed. Advised he follow up with his PCP next week for reevaluation. Patient understands and agrees with plan. Patient vitals stable throughout ED course discharged in satisfactory condition. I discussed patient case with Dr. Fredderick PhenixBelfi who got it the patient's management and agrees with plan.   I personally performed the services described in this documentation,  which was scribed in my presence. The recorded information has been reviewed and is accurate.    Final Clinical Impressions(s) / ED Diagnoses   Final diagnoses:  Tick bite, initial encounter    New Prescriptions Discharge Medication List as of 04/19/2016  7:58 PM    START taking these medications   Details  doxycycline (VIBRAMYCIN) 100 MG capsule Take 1 capsule (100 mg total) by mouth 2 (two) times daily., Starting Sat 04/19/2016, Print         Emi Holes, PA-C 04/20/16 [id number]    Rolan Bucco, MD 04/20/16 [phone]

## 2016-04-19 NOTE — Discharge Instructions (Signed)
Medications: Doxycycline  Treatment: Take doxycycline twice daily for 10 days. Apply antibiotic ointment to the wound twice daily. Keep clean by washing with warm soapy water.  Follow-up: Please follow-up with your primary care provider next week for follow-up and further evaluation of your symptoms. Please return to emergency department if you develop any new or worsening symptoms.

## 2016-04-29 ENCOUNTER — Emergency Department (HOSPITAL_COMMUNITY)
Admission: EM | Admit: 2016-04-29 | Discharge: 2016-04-29 | Disposition: A | Discharge status: Home / Self Care | Payer: 59 | Attending: Emergency Medicine | Admitting: Emergency Medicine

## 2016-04-29 ENCOUNTER — Encounter (HOSPITAL_COMMUNITY): Payer: Self-pay | Admitting: Emergency Medicine

## 2016-04-29 ENCOUNTER — Emergency Department (HOSPITAL_COMMUNITY): Payer: 59

## 2016-04-29 DIAGNOSIS — F17220 Nicotine dependence, chewing tobacco, uncomplicated: Secondary | ICD-10-CM | POA: Diagnosis not present

## 2016-04-29 DIAGNOSIS — M7661 Achilles tendinitis, right leg: Secondary | ICD-10-CM | POA: Diagnosis not present

## 2016-04-29 DIAGNOSIS — Z79899 Other long term (current) drug therapy: Secondary | ICD-10-CM | POA: Diagnosis not present

## 2016-04-29 DIAGNOSIS — M79671 Pain in right foot: Secondary | ICD-10-CM | POA: Diagnosis present

## 2016-04-29 HISTORY — DX: Spotted fever due to Rickettsia rickettsii: A77.0

## 2016-04-29 MED ORDER — NAPROXEN 500 MG PO TABS: 500.0000 mg | ORAL_TABLET | Freq: Two times a day (BID) | ORAL | 0 refills | Status: DC

## 2016-04-29 MED ORDER — HYDROCODONE-ACETAMINOPHEN 5-325 MG PO TABS
1.0000 | ORAL_TABLET | Freq: Once | ORAL | Status: AC
  Administered 2016-04-29: 1 via ORAL
  Filled 2016-04-29: qty 1

## 2016-04-29 MED ORDER — HYDROCODONE-ACETAMINOPHEN 5-325 MG PO TABS: 2.0000 | ORAL_TABLET | Freq: Four times a day (QID) | ORAL | 0 refills | Status: DC | PRN

## 2016-04-29 NOTE — Discharge Instructions (Signed)
Rest, ice affected area. Naproxen as needed for mild to moderate pain. Use pain medication only as needed for severe pain. If symptoms do not improve in 1 week, please follow up with the orthopedic physician listed. Return to ER for new or worsening symptoms, any additional concerns.   COLD THERAPY DIRECTIONS:  Ice or gel packs can be used to reduce both pain and swelling. Ice is the most helpful within the first 24 to 48 hours after an injury or flareup from overusing a muscle or joint.  Ice is effective, has very few side effects, and is safe for most people to use.   If you expose your skin to cold temperatures for too long or without the proper protection, you can damage your skin or nerves. Watch for signs of skin damage due to cold.   HOME CARE INSTRUCTIONS  Follow these tips to use ice and cold packs safely.  Place a dry or damp towel between the ice and skin. A damp towel will cool the skin more quickly, so you may need to shorten the time that the ice is used.  For a more rapid response, add gentle compression to the ice.  Ice for no more than 10 to 20 minutes at a time. The bonier the area you are icing, the less time it will take to get the benefits of ice.  Check your skin after 5 minutes to make sure there are no signs of a poor response to cold or skin damage.  Rest 20 minutes or more in between uses.  Once your skin is numb, you can end your treatment. You can test numbness by very lightly touching your skin. The touch should be so light that you do not see the skin dimple from the pressure of your fingertip. When using ice, most people will feel these normal sensations in this order: cold, burning, aching, and numbness.

## 2016-04-29 NOTE — ED Triage Notes (Signed)
Pt states the tendon in his right foot became tight on Saturday night; he tried to stretch it which he states he "shouldn't have done" because that caused the pain to start; pt struggling to put weight on foot

## 2016-04-29 NOTE — ED Provider Notes (Signed)
[facility]-EMERGENCY DEPT Provider Note   CSN: [id number] Arrival date & time: 04/29/16  0703     History   Chief Complaint Chief Complaint  Patient presents with  . Foot Pain    HPI Sean Murillo is a 40 y.o. male.  The history is provided by the patient and medical records. No language interpreter was used.  Foot Pain    Sean Murillo is a 40 y.o. male  who presents to the Emergency Department complaining of worsening right heel pain x 3-4 days. Patient states initially right heel felt tight, but he was able to walk around on it fine. He stretched with minimal relief. He woke up this morning and pain was much more severe and he was having difficulty ambulating, prompting him to come to ED today. Denies injury or increase in activity. No hx of similar sxs. No prior injuries to RLE. No swelling, redness, warmth, calf pain, open wounds, fever.    Past Medical History:  Diagnosis Date  . PTSD (post-traumatic stress disorder)   . [place] spotted fever     Patient Active Problem List   Diagnosis Date Noted  . Morbid obesity (HCC) 04/05/2014  . Partial small bowel obstruction 04/04/2014  . Leukocytosis 04/04/2014    Past Surgical History:  Procedure Laterality Date  . cyst removed from right shoulder    . HERNIA REPAIR    . MANDIBLE SURGERY    . SPLENECTOMY, TOTAL         Home Medications    Prior to Admission medications   Medication Sig Start Date End Date Taking? Authorizing Provider  cholecalciferol (VITAMIN D) 1000 units tablet Take 2,000 Units by mouth daily.    Historical Provider, MD  cyclobenzaprine (FLEXERIL) 10 MG tablet Take 1 tablet (10 mg total) by mouth 3 (three) times daily as needed for muscle spasms. 03/06/16   Mercedes Camprubi-Soms, PA-C  doxycycline (VIBRAMYCIN) 100 MG capsule Take 1 capsule (100 mg total) by mouth 2 (two) times daily. 04/19/16   Emi HolesAlexandra M Law, PA-C  HYDROcodone-acetaminophen (NORCO/VICODIN) 5-325 MG tablet Take 2  tablets by mouth every 6 (six) hours as needed for severe pain. 04/29/16   Chase PicketJaime Pilcher Thomasene Dubow, PA-C  naproxen (NAPROSYN) 500 MG tablet Take 1 tablet (500 mg total) by mouth 2 (two) times daily with a meal. 04/29/16   Chase PicketJaime Pilcher Makhya Arave, PA-C  naproxen sodium (ANAPROX) 220 MG tablet Take 1 tablet (220 mg total) by mouth every 12 (twelve) hours as needed (pain). 01/20/16   Fayrene HelperBowie Tran, PA-C  oxyCODONE-acetaminophen (PERCOCET/ROXICET) 5-325 MG tablet Take 1 tablet by mouth every 8 (eight) hours as needed for severe pain. 01/20/16   Fayrene HelperBowie Tran, PA-C  sertraline (ZOLOFT) 50 MG tablet Take 50 mg by mouth daily.    Historical Provider, MD  vitamin B-12 (CYANOCOBALAMIN) 250 MCG tablet Take 250 mcg by mouth daily.    Historical Provider, MD    Family History Family History  Problem Relation Age of Onset  . Stroke Mother     Social History Social History  Substance Use Topics  . Smoking status: Never Smoker  . Smokeless tobacco: Current User    Types: Chew     Comment: working on it  . Alcohol use No     Comment: rare     Allergies   Contrast media [iodinated diagnostic agents] and Iohexol   Review of Systems Review of Systems  Constitutional: Negative for fever.  Musculoskeletal: Positive for arthralgias. Negative for joint swelling.  Skin: Negative  for color change and wound.     Physical Exam Updated Vital Signs BP (!) 153/106 (BP Location: Left Arm)   Pulse 94   Temp 98.1 F (36.7 C) (Oral) Comment: patient drank water   Resp 18   SpO2 97%   Physical Exam  Constitutional: He is oriented to person, place, and time. He appears well-developed and well-nourished. No distress.  HENT:  Head: Normocephalic and atraumatic.  Cardiovascular: Normal rate, regular rhythm and normal heart sounds.   No murmur heard. Pulmonary/Chest: Effort normal and breath sounds normal. No respiratory distress.  Musculoskeletal:       Feet:  Tenderness to palpation over achilles as depicted in  image. Negative Thompson test. Full active and passive range of motion. No swelling, erythema, ecchymosis. 2+ DP, sensation intact.   Neurological: He is alert and oriented to person, place, and time.  Skin: Skin is warm and dry.  Psychiatric: He has a normal mood and affect.  Nursing note and vitals reviewed.    ED Treatments / Results  Labs (all labs ordered are listed, but only abnormal results are displayed) Labs Reviewed - No data to display  EKG  EKG Interpretation None       Radiology Dg Ankle Complete Right  Result Date: 04/29/2016 CLINICAL DATA:  Worsening right posterior ankle pain since Friday. EXAM: RIGHT ANKLE - COMPLETE 3+ VIEW COMPARISON:  None. FINDINGS: There is no evidence of fracture, dislocation, or joint effusion. There is no evidence of arthropathy or other focal bone abnormality. Soft tissues are unremarkable. IMPRESSION: Negative. Electronically Signed   By: Charlett NoseKevin  Dover M.D.   On: 04/29/2016 08:38    Procedures Procedures (including critical care time)  Medications Ordered in ED Medications  HYDROcodone-acetaminophen (NORCO/VICODIN) 5-325 MG per tablet 1 tablet (1 tablet Oral Given 04/29/16 [id number])     Initial Impression / Assessment and Plan / ED Course  I have reviewed the triage vital signs and the nursing notes.  Pertinent labs & imaging results that were available during my care of the patient were reviewed by me and considered in my medical decision making (see chart for details).  Clinical Course   Sean Murillo is a 40 y.o. male who presents to ED for right heel pain c/w achilles tendonitis. Negative thompson and full active/passive ROM. X-ray negative. RLE is NVI. Cam boot provided for comfort. Symptomatic home care instructions were discussed. Ortho follow up if symptoms persist. Reasons to return to ED and all questions answered.    Final Clinical Impressions(s) / ED Diagnoses   Final diagnoses:  Achilles tendinitis of right lower  extremity    New Prescriptions New Prescriptions   HYDROCODONE-ACETAMINOPHEN (NORCO/VICODIN) 5-325 MG TABLET    Take 2 tablets by mouth every 6 (six) hours as needed for severe pain.   NAPROXEN (NAPROSYN) 500 MG TABLET    Take 1 tablet (500 mg total) by mouth 2 (two) times daily with a meal.     Chase PicketJaime Pilcher Irvan Tiedt, PA-C 04/29/16 [id number]    Loren Raceravid Yelverton, MD 05/02/16 [phone]

## 2016-04-29 NOTE — ED Notes (Signed)
Patient is A & Ox4.  He is understood discharge instructions.

## 2016-04-29 NOTE — Progress Notes (Signed)
Orthopedic Tech Progress Note Patient Details:  Sean BenderMichael Gallaway 04/26/1976 [id number]  Ortho Devices Type of Ortho Device: CAM walker Ortho Device/Splint Location: Rt Leg Ortho Device/Splint Interventions: Ordered, Application   [name] [name] 04/29/2016, 10:14 AM

## 2016-05-26 ENCOUNTER — Encounter (HOSPITAL_COMMUNITY): Payer: Self-pay

## 2016-05-26 DIAGNOSIS — Y939 Activity, unspecified: Secondary | ICD-10-CM | POA: Diagnosis not present

## 2016-05-26 DIAGNOSIS — S93602A Unspecified sprain of left foot, initial encounter: Secondary | ICD-10-CM | POA: Insufficient documentation

## 2016-05-26 DIAGNOSIS — Y999 Unspecified external cause status: Secondary | ICD-10-CM | POA: Insufficient documentation

## 2016-05-26 DIAGNOSIS — Y929 Unspecified place or not applicable: Secondary | ICD-10-CM | POA: Insufficient documentation

## 2016-05-26 DIAGNOSIS — S99922A Unspecified injury of left foot, initial encounter: Secondary | ICD-10-CM | POA: Diagnosis present

## 2016-05-26 DIAGNOSIS — X501XXA Overexertion from prolonged static or awkward postures, initial encounter: Secondary | ICD-10-CM | POA: Insufficient documentation

## 2016-05-26 NOTE — ED Triage Notes (Signed)
Pt complains of pain on the side of his left foot, no injury noted

## 2016-05-27 ENCOUNTER — Emergency Department (HOSPITAL_COMMUNITY): Payer: 59

## 2016-05-27 ENCOUNTER — Emergency Department (HOSPITAL_COMMUNITY)
Admission: EM | Admit: 2016-05-27 | Discharge: 2016-05-27 | Disposition: A | Discharge status: Home / Self Care | Payer: 59 | Attending: Emergency Medicine | Admitting: Emergency Medicine

## 2016-05-27 DIAGNOSIS — S93602A Unspecified sprain of left foot, initial encounter: Secondary | ICD-10-CM

## 2016-05-27 MED ORDER — HYDROCODONE-ACETAMINOPHEN 5-325 MG PO TABS: 1.0000 | ORAL_TABLET | Freq: Four times a day (QID) | ORAL | 0 refills | Status: DC | PRN

## 2016-05-27 MED ORDER — IBUPROFEN 800 MG PO TABS: 800.0000 mg | ORAL_TABLET | Freq: Three times a day (TID) | ORAL | 0 refills | Status: DC | PRN

## 2016-05-27 MED ORDER — HYDROCODONE-ACETAMINOPHEN 5-325 MG PO TABS
1.0000 | ORAL_TABLET | Freq: Once | ORAL | Status: AC
  Administered 2016-05-27: 1 via ORAL
  Filled 2016-05-27: qty 1

## 2016-05-27 NOTE — ED Notes (Signed)
Bed: [facility]19 Expected date:  Expected time:  Means of arrival:  Comments: 

## 2016-05-27 NOTE — ED Provider Notes (Signed)
[facility] Provider Note    By signing my name below, I, Sean Murillo, attest that this documentation has been prepared under the direction and in the presence of Sean Shail Urbas, PA-C. Electronically Signed: Earmon PhoenixJennifer Murillo, ED Scribe. 05/27/16. 1:56 AM.   History   Chief Complaint Chief Complaint  Patient presents with  . Foot Pain    The history is provided by the patient and medical records. No language interpreter was used.    HPI Comments:  Sean Murillo is a morbidly obese 40 y.o. male who presents to the Emergency Department complaining of left foot pain that began yesterday. He states he stepped off a curb the wrong way and inverted his ankle. He now reports tightness of the left foot. He has not taken anything for pain. Bearing weight increases the pain. He denies alleviating factors. He denies fever, chills, nausea, vomiting, numbness, tingling or weakness of the left foot or left leg.    Past Medical History:  Diagnosis Date  . PTSD (post-traumatic stress disorder)   . [place] spotted fever     Patient Active Problem List   Diagnosis Date Noted  . Morbid obesity (HCC) 04/05/2014  . Partial small bowel obstruction 04/04/2014  . Leukocytosis 04/04/2014    Past Surgical History:  Procedure Laterality Date  . cyst removed from right shoulder    . HERNIA REPAIR    . MANDIBLE SURGERY    . SPLENECTOMY, TOTAL         Home Medications    Prior to Admission medications   Medication Sig Start Date End Date Taking? Authorizing Provider  cholecalciferol (VITAMIN D) 1000 units tablet Take 2,000 Units by mouth daily.    Historical Provider, MD  cyclobenzaprine (FLEXERIL) 10 MG tablet Take 1 tablet (10 mg total) by mouth 3 (three) times daily as needed for muscle spasms. 03/06/16   Mercedes Camprubi-Soms, PA-C  doxycycline (VIBRAMYCIN) 100 MG capsule Take 1 capsule (100 mg total) by mouth 2 (two) times daily. 04/19/16   Emi HolesAlexandra M Law, PA-C    HYDROcodone-acetaminophen (NORCO/VICODIN) 5-325 MG tablet Take 2 tablets by mouth every 6 (six) hours as needed for severe pain. 04/29/16   Chase PicketJaime Pilcher Ward, PA-C  naproxen (NAPROSYN) 500 MG tablet Take 1 tablet (500 mg total) by mouth 2 (two) times daily with a meal. 04/29/16   Chase PicketJaime Pilcher Ward, PA-C  naproxen sodium (ANAPROX) 220 MG tablet Take 1 tablet (220 mg total) by mouth every 12 (twelve) hours as needed (pain). 01/20/16   Fayrene HelperBowie Tran, PA-C  oxyCODONE-acetaminophen (PERCOCET/ROXICET) 5-325 MG tablet Take 1 tablet by mouth every 8 (eight) hours as needed for severe pain. 01/20/16   Fayrene HelperBowie Tran, PA-C  sertraline (ZOLOFT) 50 MG tablet Take 50 mg by mouth daily.    Historical Provider, MD  vitamin B-12 (CYANOCOBALAMIN) 250 MCG tablet Take 250 mcg by mouth daily.    Historical Provider, MD    Family History Family History  Problem Relation Age of Onset  . Stroke Mother     Social History Social History  Substance Use Topics  . Smoking status: Never Smoker  . Smokeless tobacco: Current User    Types: Chew     Comment: working on it  . Alcohol use No     Comment: rare     Allergies   Contrast media [iodinated diagnostic agents] and Iohexol   Review of Systems Review of Systems A complete 10 system review of systems was obtained and all systems are negative except  as noted in the HPI and PMH.    Physical Exam Updated Vital Signs BP (!) 159/104 (BP Location: Left Arm)   Pulse 94   Temp 97.8 F (36.6 C) (Oral)   Resp 20   SpO2 97%   Physical Exam  Constitutional: He is oriented to person, place, and time. He appears well-developed and well-nourished. No distress.  HENT:  Head: Normocephalic and atraumatic.  Mouth/Throat: Oropharynx is clear and moist.  Eyes: Pupils are equal, round, and reactive to light.  Neck: Normal range of motion. Neck supple.  Cardiovascular: Normal rate, regular rhythm and normal heart sounds.  Exam reveals no gallop and no friction rub.    No murmur heard. Pulmonary/Chest: Effort normal and breath sounds normal. No respiratory distress. He has no wheezes.  Abdominal: Soft. Bowel sounds are normal. He exhibits no distension. There is no tenderness.  Musculoskeletal: He exhibits edema and tenderness. He exhibits no deformity.  Pain along lateral aspect of left foot. Swelling of left ankle without pain.  Neurological: He is alert and oriented to person, place, and time. He exhibits normal muscle tone. Coordination normal.  Skin: Skin is warm and dry. No rash noted. No erythema.  Psychiatric: He has a normal mood and affect. His behavior is normal.  Nursing note and vitals reviewed.    ED Treatments / Results  DIAGNOSTIC STUDIES: Oxygen Saturation is 97% on RA, normal by my interpretation.   COORDINATION OF CARE: 1:55 AM- Will wait for imaging to result. Pt verbalizes understanding and agrees to plan.  Medications - No data to display  Labs (all labs ordered are listed, but only abnormal results are displayed) Labs Reviewed - No data to display  EKG  EKG Interpretation None       Radiology Dg Foot Complete Left  Result Date: 05/27/2016 CLINICAL DATA:  Pain on the lateral side of the left foot since 11/27 morning. Difficulty weight-bearing. No known injury. EXAM: LEFT FOOT - COMPLETE 3+ VIEW COMPARISON:  None. FINDINGS: There is no evidence of fracture or dislocation. There is no evidence of arthropathy or other focal bone abnormality. Soft tissues are unremarkable. IMPRESSION: Negative. Electronically Signed   By: Burman NievesWilliam  Stevens M.D.   On: 05/27/2016 00:26    Procedures Procedures (including critical care time)  Medications Ordered in ED Medications - No data to display   Initial Impression / Assessment and Plan / ED Course  I have reviewed the triage vital signs and the nursing notes.  Pertinent labs & imaging results that were available during my care of the patient were reviewed by me and considered  in my medical decision making (see chart for details).  Clinical Course     X-rays did not show any fractures.  The patient will be referred to orthopedics and told to ice and elevate the foot  Final Clinical Impressions(s) / ED Diagnoses   Final diagnoses:  None    New Prescriptions New Prescriptions   No medications on file     Charlestine NightChristopher Katriona Schmierer, PA-C 05/27/16 0630    Tomasita CrumbleAdeleke Oni, MD 05/27/16 [phone]

## 2016-05-27 NOTE — ED Notes (Signed)
Bed: WA21 Expected date:  Expected time:  Means of arrival:  Comments: 

## 2016-05-27 NOTE — Discharge Instructions (Signed)
Return here as needed.  Follow-up with the orthopedist provided if not improving.  Ice and elevate the foot.  Your x-rays were normal

## 2016-08-10 ENCOUNTER — Encounter (HOSPITAL_COMMUNITY): Payer: Self-pay

## 2016-08-10 ENCOUNTER — Emergency Department (HOSPITAL_COMMUNITY)
Admission: EM | Admit: 2016-08-10 | Discharge: 2016-08-10 | Disposition: A | Discharge status: Home / Self Care | Payer: 59 | Attending: Emergency Medicine | Admitting: Emergency Medicine

## 2016-08-10 DIAGNOSIS — M5430 Sciatica, unspecified side: Secondary | ICD-10-CM | POA: Diagnosis not present

## 2016-08-10 DIAGNOSIS — Z79899 Other long term (current) drug therapy: Secondary | ICD-10-CM | POA: Diagnosis not present

## 2016-08-10 DIAGNOSIS — M545 Low back pain: Secondary | ICD-10-CM | POA: Diagnosis present

## 2016-08-10 MED ORDER — LIDOCAINE 5 % EX PTCH: 1.0000 | MEDICATED_PATCH | CUTANEOUS | 0 refills | Status: DC

## 2016-08-10 MED ORDER — METHOCARBAMOL 500 MG PO TABS
1000.0000 mg | ORAL_TABLET | Freq: Once | ORAL | Status: AC
  Administered 2016-08-10: 1000 mg via ORAL
  Filled 2016-08-10: qty 2

## 2016-08-10 MED ORDER — NAPROXEN 375 MG PO TABS: 375.0000 mg | ORAL_TABLET | Freq: Two times a day (BID) | ORAL | 0 refills | Status: DC

## 2016-08-10 MED ORDER — NAPROXEN 500 MG PO TABS
500.0000 mg | ORAL_TABLET | Freq: Once | ORAL | Status: AC
  Administered 2016-08-10: 500 mg via ORAL
  Filled 2016-08-10: qty 1

## 2016-08-10 MED ORDER — ACETAMINOPHEN 500 MG PO TABS
1000.0000 mg | ORAL_TABLET | Freq: Once | ORAL | Status: AC
  Administered 2016-08-10: 1000 mg via ORAL
  Filled 2016-08-10: qty 2

## 2016-08-10 MED ORDER — METHOCARBAMOL 500 MG PO TABS: 500.0000 mg | ORAL_TABLET | Freq: Two times a day (BID) | ORAL | 0 refills | Status: DC

## 2016-08-10 NOTE — ED Notes (Signed)
Bed: WA05 Expected date:  Expected time:  Means of arrival:  Comments: 

## 2016-08-10 NOTE — ED Provider Notes (Signed)
[facility]-EMERGENCY DEPT Provider Note   CSN: [id number] Arrival date & time: 08/10/16  4098 By signing my name below, I, Sean Murillo, attest that this documentation has been prepared under the direction and in the presence of [name], MD . Electronically Signed: Levon Murillo, Scribe. 08/10/2016. 3:15 AM.   History   Chief Complaint Chief Complaint  Patient presents with  . Back Pain    radiates down right leg off and on for one month.   HPI Sean Murillo is a 41 y.o. male who presents to the Emergency Department complaining of intermittent, shooting lower back pain that radiates down his right leg onset one month ago. Per pt, pain is alleviated when sitting still and is exacerbated by movement. Pt has applied ice with no relief; no medications tried PTA. He denies any bowel or bladder symptoms and has no other complaints at this time.   The history is provided by the patient. No language interpreter was used.  Back Pain   This is a recurrent problem. The current episode started more than 1 week ago. The problem has been gradually worsening. The pain is associated with no known injury. The pain is present in the lumbar spine. The quality of the pain is described as shooting. The pain radiates to the right thigh and right knee. The pain is at a severity of 10/10. The pain is severe. The symptoms are aggravated by certain positions. Pertinent negatives include no chest pain, no fever, no numbness, no weight loss, no headaches, no abdominal pain, no abdominal swelling, no bowel incontinence, no perianal numbness, no bladder incontinence, no dysuria, no pelvic pain, no paresthesias, no paresis, no tingling and no weakness. He has tried ice for the symptoms. The treatment provided no relief. Risk factors include obesity.   Past Medical History:  Diagnosis Date  . PTSD (post-traumatic stress disorder)   . [place] spotted fever     Patient Active Problem List   Diagnosis Date  Noted  . Morbid obesity (HCC) 04/05/2014  . Partial small bowel obstruction 04/04/2014  . Leukocytosis 04/04/2014   Past Surgical History:  Procedure Laterality Date  . cyst removed from right shoulder    . HERNIA REPAIR    . MANDIBLE SURGERY    . SPLENECTOMY, TOTAL      Home Medications    Prior to Admission medications   Medication Sig Start Date End Date Taking? Authorizing Provider  cholecalciferol (VITAMIN D) 1000 units tablet Take 2,000 Units by mouth daily.    Historical Provider, MD  cyclobenzaprine (FLEXERIL) 10 MG tablet Take 1 tablet (10 mg total) by mouth 3 (three) times daily as needed for muscle spasms. 03/06/16   Mercedes Street, PA-C  doxycycline (VIBRAMYCIN) 100 MG capsule Take 1 capsule (100 mg total) by mouth 2 (two) times daily. 04/19/16   Emi Holes, PA-C  HYDROcodone-acetaminophen (NORCO/VICODIN) 5-325 MG tablet Take 1 tablet by mouth every 6 (six) hours as needed for moderate pain. 05/27/16   Charlestine Night, PA-C  ibuprofen (ADVIL,MOTRIN) 800 MG tablet Take 1 tablet (800 mg total) by mouth every 8 (eight) hours as needed. 05/27/16   Charlestine Night, PA-C  naproxen (NAPROSYN) 500 MG tablet Take 1 tablet (500 mg total) by mouth 2 (two) times daily with a meal. 04/29/16   Chase Picket Ward, PA-C  naproxen sodium (ANAPROX) 220 MG tablet Take 1 tablet (220 mg total) by mouth every 12 (twelve) hours as needed (pain). 01/20/16   Fayrene Helper, PA-C  oxyCODONE-acetaminophen (PERCOCET/ROXICET)  5-325 MG tablet Take 1 tablet by mouth every 8 (eight) hours as needed for severe pain. 01/20/16   Fayrene HelperBowie Tran, PA-C  sertraline (ZOLOFT) 50 MG tablet Take 50 mg by mouth daily.    Historical Provider, MD  vitamin B-12 (CYANOCOBALAMIN) 250 MCG tablet Take 250 mcg by mouth daily.    Historical Provider, MD   Family History Family History  Problem Relation Age of Onset  . Stroke Mother    Social History Social History  Substance Use Topics  . Smoking status: Never Smoker  .  Smokeless tobacco: Current User    Types: Chew     Comment: working on it  . Alcohol use No     Comment: rare    Allergies   Contrast media [iodinated diagnostic agents] and Iohexol  Review of Systems Review of Systems  Constitutional: Negative for fever and weight loss.  Cardiovascular: Negative for chest pain.  Gastrointestinal: Negative.  Negative for abdominal pain and bowel incontinence.  Genitourinary: Negative.  Negative for bladder incontinence, difficulty urinating, dysuria and pelvic pain.  Musculoskeletal: Positive for back pain and myalgias. Negative for gait problem.  Neurological: Negative for tingling, weakness, numbness, headaches and paresthesias.  All other systems reviewed and are negative.  Physical Exam Updated Vital Signs BP 135/80   Pulse 83   Temp 98.4 F (36.9 C) (Oral)   Resp 18   Ht 5\' 9"  (1.753 m)   Wt (!) 353 lb (160.1 kg)   SpO2 95%   BMI 52.13 kg/m   Physical Exam  Constitutional: He is oriented to person, place, and time. He appears well-developed and well-nourished. No distress.  HENT:  Head: Normocephalic and atraumatic.  Mouth/Throat: Oropharynx is clear and moist. No oropharyngeal exudate.  Moist mucous membranes   Eyes: Conjunctivae are normal. Pupils are equal, round, and reactive to light.  Neck: No JVD present.  Trachea midline No bruit  Cardiovascular: Normal rate, regular rhythm, normal heart sounds and intact distal pulses.    intact DTR, intact distal pulses.  Pulmonary/Chest: Effort normal and breath sounds normal. No stridor. No respiratory distress.  Abdominal: Soft. Bowel sounds are normal. He exhibits no distension.  Musculoskeletal: Normal range of motion. He exhibits no edema, tenderness or deformity.  Pelvis stable. Achilles intact. All compartments are soft. Intact sensation. Intact L5 S1.   Neurological: He is alert and oriented to person, place, and time. He has normal reflexes. He displays normal reflexes. No  cranial nerve deficit or sensory deficit. He exhibits normal muscle tone. Coordination normal.  Skin: Skin is warm and dry. Capillary refill takes less than 2 seconds.  Psychiatric: He has a normal mood and affect. His behavior is normal.  Nursing note and vitals reviewed.  ED Treatments / Results   Vitals:   08/10/16 0330 08/10/16 0400  BP: (!) 144/104 143/94  Pulse: 85 76  Resp:    Temp:    ; DIAGNOSTIC STUDIES:  Oxygen Saturation is 97% on RA, normal by my interpretation.    COORDINATION OF CARE:  3:09 AM Discussed treatment plan with pt at bedside and pt agreed to plan.   Procedures Procedures (including critical care time)  Medications Ordered in ED  Medications  acetaminophen (TYLENOL) tablet 1,000 mg (1,000 mg Oral Given 08/10/16 0406)  naproxen (NAPROSYN) tablet 500 mg (500 mg Oral Given 08/10/16 0406)  methocarbamol (ROBAXIN) tablet 1,000 mg (1,000 mg Oral Given 08/10/16 0406)      Final Clinical Impressions(s) / ED Diagnoses  Sciatica: All  questions answered to patient's satisfaction. Based on history and exam patient has been appropriately medically screened and emergency conditions excluded. Patient is stable for discharge at this time. Strict return precautions given for any further episodes, persistent fever, weakness or any concerns.  I personally performed the services described in this documentation, which was scribed in my presence. The recorded information has been reviewed and is accurate.      Cy Blamer, MD 08/10/16 [phone]

## 2016-08-10 NOTE — ED Triage Notes (Signed)
Lower back pain radiates down right leg off and on for one month no bowel or bladder problem noted.

## 2016-08-11 ENCOUNTER — Emergency Department (HOSPITAL_COMMUNITY)
Admission: EM | Admit: 2016-08-11 | Discharge: 2016-08-11 | Disposition: A | Discharge status: Home / Self Care | Payer: 59 | Attending: Emergency Medicine | Admitting: Emergency Medicine

## 2016-08-11 ENCOUNTER — Encounter (HOSPITAL_COMMUNITY): Payer: Self-pay | Admitting: *Deleted

## 2016-08-11 ENCOUNTER — Emergency Department (HOSPITAL_COMMUNITY): Payer: 59

## 2016-08-11 DIAGNOSIS — R69 Illness, unspecified: Secondary | ICD-10-CM

## 2016-08-11 DIAGNOSIS — J111 Influenza due to unidentified influenza virus with other respiratory manifestations: Secondary | ICD-10-CM | POA: Insufficient documentation

## 2016-08-11 DIAGNOSIS — Z79899 Other long term (current) drug therapy: Secondary | ICD-10-CM | POA: Insufficient documentation

## 2016-08-11 MED ORDER — ACETAMINOPHEN 325 MG PO TABS
650.0000 mg | ORAL_TABLET | Freq: Once | ORAL | Status: AC
  Administered 2016-08-11: 650 mg via ORAL
  Filled 2016-08-11: qty 2

## 2016-08-11 NOTE — ED Provider Notes (Signed)
[facility]-EMERGENCY DEPT Provider Note   CSN: [id number] Arrival date & time: 08/11/16  0014   By signing my name below, I, Clovis PuAvnee Patel, attest that this documentation has been prepared under the direction and in the presence of Dione Boozeavid Melesa Lecy, MD  Electronically Signed: Clovis PuAvnee Patel, ED Scribe. 08/11/16. 2:01 AM.   History   Chief Complaint Chief Complaint  Patient presents with  . Influenza    The history is provided by the patient. No language interpreter was used.   HPI Comments:  Sean Murillo is a 41 y.o. male who presents to the Emergency Department complaining of "5-6/10" generalized body aches onset yesterday. Pt also reports chills, productive cough with yellow sputum, chest discomfort, fever (tmax 100.3) and night sweats. He has taken Dayquil with no significant relief. Pt has had his flu shot this year. No other associated symptoms noted. Pt is a non-smoker.   PCP: Trinna PostKOBERLEIN, JUNELL CAROL, MD  Past Medical History:  Diagnosis Date  . PTSD (post-traumatic stress disorder)   . [place] spotted fever     Patient Active Problem List   Diagnosis Date Noted  . Morbid obesity (HCC) 04/05/2014  . Partial small bowel obstruction 04/04/2014  . Leukocytosis 04/04/2014    Past Surgical History:  Procedure Laterality Date  . cyst removed from right shoulder    . HERNIA REPAIR    . MANDIBLE SURGERY    . SPLENECTOMY, TOTAL         Home Medications    Prior to Admission medications   Medication Sig Start Date End Date Taking? Authorizing Provider  cholecalciferol (VITAMIN D) 1000 units tablet Take 2,000 Units by mouth daily.    Historical Provider, MD  doxycycline (VIBRAMYCIN) 100 MG capsule Take 1 capsule (100 mg total) by mouth 2 (two) times daily. Patient not taking: Reported on 08/10/2016 04/19/16   Emi HolesAlexandra M Law, PA-C  HYDROcodone-acetaminophen (NORCO/VICODIN) 5-325 MG tablet Take 1 tablet by mouth every 6 (six) hours as needed for moderate pain. Patient  not taking: Reported on 08/10/2016 05/27/16   Charlestine Nighthristopher Lawyer, PA-C  lidocaine (LIDODERM) 5 % Place 1 patch onto the skin daily. Remove & Discard patch within 12 hours or as directed by MD 08/10/16   April Palumbo, MD  methocarbamol (ROBAXIN) 500 MG tablet Take 1 tablet (500 mg total) by mouth 2 (two) times daily. 08/10/16   April Palumbo, MD  naproxen (NAPROSYN) 375 MG tablet Take 1 tablet (375 mg total) by mouth 2 (two) times daily. 08/10/16   April Palumbo, MD  oxyCODONE-acetaminophen (PERCOCET/ROXICET) 5-325 MG tablet Take 1 tablet by mouth every 8 (eight) hours as needed for severe pain. Patient not taking: Reported on 08/10/2016 01/20/16   Fayrene HelperBowie Tran, PA-C  vitamin B-12 (CYANOCOBALAMIN) 250 MCG tablet Take 250 mcg by mouth daily.    Historical Provider, MD    Family History Family History  Problem Relation Age of Onset  . Stroke Mother     Social History Social History  Substance Use Topics  . Smoking status: Never Smoker  . Smokeless tobacco: Current User    Types: Chew     Comment: working on it  . Alcohol use No     Comment: rare     Allergies   Contrast media [iodinated diagnostic agents] and Iohexol   Review of Systems Review of Systems  Constitutional: Positive for chills, diaphoresis and fever.  Respiratory: Positive for cough.   Musculoskeletal: Positive for myalgias.  All other systems reviewed and are negative.  Physical  Exam Updated Vital Signs BP 128/95 (BP Location: Left Arm)   Pulse 107   Temp 100.5 F (38.1 C) (Oral)   Resp 18   SpO2 97%   Physical Exam  Constitutional: He is oriented to person, place, and time. He appears well-developed and well-nourished.  HENT:  Head: Normocephalic and atraumatic.  Eyes: EOM are normal. Pupils are equal, round, and reactive to light.  Neck: Normal range of motion. Neck supple. No JVD present.  Cardiovascular: Normal rate, regular rhythm and normal heart sounds.   No murmur heard. Pulmonary/Chest: Effort  normal and breath sounds normal. He has no wheezes. He has no rales. He exhibits no tenderness.  Abdominal: Soft. Bowel sounds are normal. He exhibits no distension and no mass. There is no tenderness.  Musculoskeletal: Normal range of motion. He exhibits no edema.  Lymphadenopathy:    He has no cervical adenopathy.  Neurological: He is alert and oriented to person, place, and time. No cranial nerve deficit. He exhibits normal muscle tone. Coordination normal.  Skin: Skin is warm and dry. No rash noted.  Psychiatric: He has a normal mood and affect. His behavior is normal. Judgment and thought content normal.  Nursing note and vitals reviewed.  ED Treatments / Results  DIAGNOSTIC STUDIES:  Oxygen Saturation is 97% on RA, normal by my interpretation.    COORDINATION OF CARE:  2:00 AM Discussed treatment plan with pt at bedside and pt agreed to plan.   EKG  EKG Interpretation  Date/Time:  Monday August 11 2016 00:19:47 EST Ventricular Rate:  113 PR Interval:  160 QRS Duration: 100 QT Interval:  336 QTC Calculation: 460 R Axis:   28 Text Interpretation:  Sinus tachycardia Cannot rule out Inferior infarct , age undetermined Abnormal ECG When compared with ECG of 11/11/2015, HEART RATE has increased Confirmed by Preston Fleeting  MD, Parv Manthey ([id number]) on 08/11/2016 12:24:18 AM       Radiology Dg Chest 2 View  Result Date: 08/11/2016 CLINICAL DATA:  41 y/o  M; fever and cough since yesterday. EXAM: CHEST  2 VIEW COMPARISON:  07/18/2013 chest radiograph FINDINGS: Stable cardiomegaly given projection and technique. Linear opacity right lung base is probably atelectasis. No focal consolidation. Mild multilevel degenerative changes of the thoracic spine. Surgical clips project over the left upper abdomen. IMPRESSION: Stable cardiomegaly. Right basilar platelike atelectasis. No focal consolidation. Electronically Signed   By: Mitzi Hansen M.D.   On: 08/11/2016 02:26     Procedures Procedures (including critical care time)  Medications Ordered in ED Medications - No data to display   Initial Impression / Assessment and Plan / ED Course  I have reviewed the triage vital signs and the nursing notes.  Pertinent labs & imaging results that were available during my care of the patient were reviewed by me and considered in my medical decision making (see chart for details).  Flulike illness. No red flags to suggest more serious pathology. Chest x-rays obtained to rule out pneumonia and is unremarkable. Patient was offered prescription for oseltamivir, but has declined. He is advised on symptomatic treatment.  Final Clinical Impressions(s) / ED Diagnoses   Final diagnoses:  Influenza-like illness    New Prescriptions New Prescriptions   No medications on file   I personally performed the services described in this documentation, which was scribed in my presence. The recorded information has been reviewed and is accurate.       Dione Booze, MD 08/11/16 [phone]

## 2016-08-11 NOTE — ED Notes (Signed)
Patient transported to X-ray 

## 2016-08-11 NOTE — ED Triage Notes (Signed)
Pt here for body aches and productive cough and congestion and body aches since this afternoon. No sob, pt had temp 100.3 at home and chills

## 2016-08-11 NOTE — ED Notes (Signed)
Pt and family understood dc material. NAD noted.. Work note given at [employer]

## 2016-08-13 ENCOUNTER — Emergency Department (HOSPITAL_COMMUNITY): Payer: 59

## 2016-08-13 ENCOUNTER — Encounter (HOSPITAL_COMMUNITY): Payer: Self-pay | Admitting: *Deleted

## 2016-08-13 ENCOUNTER — Emergency Department (HOSPITAL_COMMUNITY)
Admission: EM | Admit: 2016-08-13 | Discharge: 2016-08-13 | Disposition: A | Discharge status: Home / Self Care | Payer: 59 | Attending: Emergency Medicine | Admitting: Emergency Medicine

## 2016-08-13 DIAGNOSIS — Z791 Long term (current) use of non-steroidal anti-inflammatories (NSAID): Secondary | ICD-10-CM | POA: Insufficient documentation

## 2016-08-13 DIAGNOSIS — R197 Diarrhea, unspecified: Secondary | ICD-10-CM

## 2016-08-13 DIAGNOSIS — K458 Other specified abdominal hernia without obstruction or gangrene: Secondary | ICD-10-CM | POA: Insufficient documentation

## 2016-08-13 DIAGNOSIS — F17220 Nicotine dependence, chewing tobacco, uncomplicated: Secondary | ICD-10-CM | POA: Insufficient documentation

## 2016-08-13 DIAGNOSIS — R1033 Periumbilical pain: Secondary | ICD-10-CM | POA: Diagnosis present

## 2016-08-13 LAB — COMPREHENSIVE METABOLIC PANEL
ALK PHOS: 58 U/L (ref 38–126)
ALT: 40 U/L (ref 17–63)
AST: 23 U/L (ref 15–41)
Albumin: 3.4 g/dL — ABNORMAL LOW (ref 3.5–5.0)
Anion gap: 6 (ref 5–15)
BUN: 13 mg/dL (ref 6–20)
CALCIUM: 8.1 mg/dL — AB (ref 8.9–10.3)
CO2: 30 mmol/L (ref 22–32)
CREATININE: 0.79 mg/dL (ref 0.61–1.24)
Chloride: 106 mmol/L (ref 101–111)
Glucose, Bld: 103 mg/dL — ABNORMAL HIGH (ref 65–99)
Potassium: 3.9 mmol/L (ref 3.5–5.1)
Sodium: 142 mmol/L (ref 135–145)
Total Bilirubin: 0.6 mg/dL (ref 0.3–1.2)
Total Protein: 6.9 g/dL (ref 6.5–8.1)

## 2016-08-13 LAB — CBC WITH DIFFERENTIAL/PLATELET
Basophils Absolute: 0.1 10*3/uL (ref 0.0–0.1)
Basophils Relative: 0 %
Eosinophils Absolute: 0.4 10*3/uL (ref 0.0–0.7)
Eosinophils Relative: 2 %
HEMATOCRIT: 46.6 % (ref 39.0–52.0)
HEMOGLOBIN: 16.9 g/dL (ref 13.0–17.0)
LYMPHS ABS: 2.2 10*3/uL (ref 0.7–4.0)
Lymphocytes Relative: 12 %
MCH: 29.9 pg (ref 26.0–34.0)
MCHC: 36.3 g/dL — ABNORMAL HIGH (ref 30.0–36.0)
MCV: 82.5 fL (ref 78.0–100.0)
Monocytes Absolute: 2.1 10*3/uL — ABNORMAL HIGH (ref 0.1–1.0)
Monocytes Relative: 12 %
NEUTROS ABS: 13.5 10*3/uL — AB (ref 1.7–7.7)
NEUTROS PCT: 74 %
Platelets: 367 10*3/uL (ref 150–400)
RBC: 5.65 MIL/uL (ref 4.22–5.81)
RDW: 14.2 % (ref 11.5–15.5)
WBC: 18.3 10*3/uL — AB (ref 4.0–10.5)

## 2016-08-13 MED ORDER — SODIUM CHLORIDE 0.9 % IV BOLUS (SEPSIS)
1000.0000 mL | Freq: Once | INTRAVENOUS | Status: AC
  Administered 2016-08-13: 1000 mL via INTRAVENOUS

## 2016-08-13 MED ORDER — ONDANSETRON HCL 4 MG/2ML IJ SOLN
4.0000 mg | Freq: Once | INTRAMUSCULAR | Status: AC
  Administered 2016-08-13: 4 mg via INTRAVENOUS
  Filled 2016-08-13: qty 2

## 2016-08-13 MED ORDER — BENZONATATE 100 MG PO CAPS: ORAL_CAPSULE | ORAL | 0 refills | Status: DC

## 2016-08-13 NOTE — ED Triage Notes (Signed)
Pt c/o umbilical pain x 1 day; pt states he has a hernia

## 2016-08-13 NOTE — Discharge Instructions (Signed)
You can take mucinex DM OTC for cough or try the tessalon perles. Drink plenty of fluids. Treat your fever as needed. Follow up with your surgeon about getting the hernia repaired.  Return to the ED if you get severe pain with vomiting. You can take imodium OTC for the diarrhea.

## 2016-08-13 NOTE — ED Provider Notes (Signed)
[facility]-EMERGENCY DEPT Provider Note   CSN: [id number] Arrival date & time: 08/13/16  0113  Time seen 01:35 AM   History   Chief Complaint Chief Complaint  Patient presents with  . Abdominal Pain    HPI Sean Murillo is a 41 y.o. male.  HPI  patient states he started getting flu symptoms on February 11. He states he had chills, body aches, cough, sore throat, sinus pressure and green nasal drainage. He states he has had several abdominal hernias, the current one that is bothering him had a mesh repair done in 2015 at [facility]. He states in 2016 the hernia returned. He has been seen by a Careers adviser at [facility] in [place] and they are going to repair his hernia however he needs to lose about 30 pounds. He states he thinks that it started getting bigger yesterday. He states it's always out it never seems to go in and out. He also has had nausea without vomiting. Tonight he has had about 8 or 9 episodes of watery diarrhea. He states anything he drinks just goes right through. He had fever to 100.3. He has had chills tonight. He denies any dysuria or frequency.  PCP Pershing Proud   Past Medical History:  Diagnosis Date  . PTSD (post-traumatic stress disorder)   . [place] spotted fever     Patient Active Problem List   Diagnosis Date Noted  . Morbid obesity (HCC) 04/05/2014  . Partial small bowel obstruction 04/04/2014  . Leukocytosis 04/04/2014    Past Surgical History:  Procedure Laterality Date  . cyst removed from right shoulder    . HERNIA REPAIR    . MANDIBLE SURGERY    . SPLENECTOMY, TOTAL         Home Medications    Prior to Admission medications   Medication Sig Start Date End Date Taking? Authorizing Provider  benzonatate (TESSALON) 100 MG capsule Take 1 or 2 po Q 6hrs for cough 08/13/16   Devoria Albe, MD  cholecalciferol (VITAMIN D) 1000 units tablet Take 2,000 Units by mouth daily.    Historical Provider, MD  lidocaine  (LIDODERM) 5 % Place 1 patch onto the skin daily. Remove & Discard patch within 12 hours or as directed by MD 08/10/16   April Palumbo, MD  methocarbamol (ROBAXIN) 500 MG tablet Take 1 tablet (500 mg total) by mouth 2 (two) times daily. 08/10/16   April Palumbo, MD  naproxen (NAPROSYN) 375 MG tablet Take 1 tablet (375 mg total) by mouth 2 (two) times daily. 08/10/16   April Palumbo, MD  vitamin B-12 (CYANOCOBALAMIN) 250 MCG tablet Take 250 mcg by mouth daily.    Historical Provider, MD    Family History Family History  Problem Relation Age of Onset  . Stroke Mother     Social History Social History  Substance Use Topics  . Smoking status: Never Smoker  . Smokeless tobacco: Current User    Types: Chew     Comment: working on it  . Alcohol use No     Comment: rare  employed Lives with spouse   Allergies   Contrast media [iodinated diagnostic agents] and Iohexol   Review of Systems Review of Systems  All other systems reviewed and are negative.    Physical Exam Updated Vital Signs BP (!) 142/107 (BP Location: Left Arm)   Pulse 116   Temp 99.1 F (37.3 C) (Oral)   Resp 20   Ht 5\' 9"  (1.753 m)   Wt Marland Kitchen)  353 lb (160.1 kg)   SpO2 95%   BMI 52.13 kg/m   Vital signs normal except for tachycardia, diastolic hypertension   Physical Exam  Constitutional: He is oriented to person, place, and time. He appears well-developed and well-nourished.  Non-toxic appearance. He does not appear ill. No distress.  obese  HENT:  Head: Normocephalic and atraumatic.  Right Ear: External ear normal.  Left Ear: External ear normal.  Nose: Nose normal. No mucosal edema or rhinorrhea.  Mouth/Throat: Oropharynx is clear and moist and mucous membranes are normal. No dental abscesses or uvula swelling.  Eyes: Conjunctivae and EOM are normal. Pupils are equal, round, and reactive to light.  Neck: Normal range of motion and full passive range of motion without pain. Neck supple.  Cardiovascular:  Normal rate, regular rhythm and normal heart sounds.  Exam reveals no gallop and no friction rub.   No murmur heard. Pulmonary/Chest: Effort normal and breath sounds normal. No respiratory distress. He has no wheezes. He has no rhonchi. He has no rales. He exhibits no tenderness and no crepitus.  Abdominal: Soft. Normal appearance and bowel sounds are normal. He exhibits no distension. There is no tenderness. There is no rebound and no guarding.  Large right broad-based hernia in his right lateral abdomen, it is about the size of  my palm with fingers spread out. He has active bowel sounds. It is not hard to palpation.   Musculoskeletal: Normal range of motion. He exhibits no edema or tenderness.  Moves all extremities well.   Neurological: He is alert and oriented to person, place, and time. He has normal strength. No cranial nerve deficit.  Skin: Skin is warm, dry and intact. No rash noted. No erythema. No pallor.  Psychiatric: He has a normal mood and affect. His speech is normal and behavior is normal. His mood appears not anxious.  Nursing note and vitals reviewed.    ED Treatments / Results  Labs (all labs ordered are listed, but only abnormal results are displayed)   Results for orders placed or performed during the hospital encounter of 08/13/16  CBC with Differential  Result Value Ref Range   WBC 18.3 (H) 4.0 - 10.5 K/uL   RBC 5.65 4.22 - 5.81 MIL/uL   Hemoglobin 16.9 13.0 - 17.0 g/dL   HCT 16.1 09.6 - 04.5 %   MCV 82.5 78.0 - 100.0 fL   MCH 29.9 26.0 - 34.0 pg   MCHC 36.3 (H) 30.0 - 36.0 g/dL   RDW 40.9 81.1 - 91.4 %   Platelets 367 150 - 400 K/uL   Neutrophils Relative % 74 %   Neutro Abs 13.5 (H) 1.7 - 7.7 K/uL   Lymphocytes Relative 12 %   Lymphs Abs 2.2 0.7 - 4.0 K/uL   Monocytes Relative 12 %   Monocytes Absolute 2.1 (H) 0.1 - 1.0 K/uL   Eosinophils Relative 2 %   Eosinophils Absolute 0.4 0.0 - 0.7 K/uL   Basophils Relative 0 %   Basophils Absolute 0.1 0.0 - 0.1  K/uL  Comprehensive metabolic panel  Result Value Ref Range   Sodium 142 135 - 145 mmol/L   Potassium 3.9 3.5 - 5.1 mmol/L   Chloride 106 101 - 111 mmol/L   CO2 30 22 - 32 mmol/L   Glucose, Bld 103 (H) 65 - 99 mg/dL   BUN 13 6 - 20 mg/dL   Creatinine, Ser 7.82 0.61 - 1.24 mg/dL   Calcium 8.1 (L) 8.9 - 10.3 mg/dL  Total Protein 6.9 6.5 - 8.1 g/dL   Albumin 3.4 (L) 3.5 - 5.0 g/dL   AST 23 15 - 41 U/L   ALT 40 17 - 63 U/L   Alkaline Phosphatase 58 38 - 126 U/L   Total Bilirubin 0.6 0.3 - 1.2 mg/dL   GFR calc non Af Amer >60 >60 mL/min   GFR calc Af Amer >60 >60 mL/min   Anion gap 6 5 - 15   Laboratory interpretation all normal except Leukocytosis     EKG  EKG Interpretation None       Radiology  Ct Abdomen Pelvis Wo Contrast  Result Date: 08/13/2016 CLINICAL DATA:  Umbilical pain for 1 day. History of enlarging hernia. EXAM: CT ABDOMEN AND PELVIS WITHOUT CONTRAST TECHNIQUE: Multidetector CT imaging of the abdomen and pelvis was performed following the standard protocol without IV contrast. COMPARISON:  Abdominal radiographs August 13, 2016 and CT abdomen and pelvis April 04, 2015 FINDINGS: Large body habitus results in overall noisy image quality. LOWER CHEST: Lung bases are clear. The visualized heart size is normal. Prominent mediastinal fat. No pericardial effusion. HEPATOBILIARY: Normal. PANCREAS: Normal. SPLEEN: Status post splenectomy and with residual splenules, unchanged. ADRENALS/URINARY TRACT: Kidneys are orthotopic, demonstrating normal size and morphology. No nephrolithiasis, hydronephrosis; limited assessment for renal masses on this nonenhanced examination. The unopacified ureters are normal in course and caliber. Urinary bladder is decompressed and unremarkable. Normal adrenal glands. STOMACH/BOWEL: The stomach, small and large bowel are normal in caliber without inflammatory changes, sensitivity decreased by lack of enteric contrast. Normal appendix.  VASCULAR/LYMPHATIC: Aortoiliac vessels are normal in course and caliber trace calcific atherosclerosis. No lymphadenopathy by CT size criteria. REPRODUCTIVE: Normal. OTHER: No intraperitoneal free fluid or free air. MUSCULOSKELETAL: Status post anterior abdominal wall herniorrhaphy with enlarging ventral hernia at surgical site. The hernia demonstrates multiple compartments and, wider neck than prior CT (6.5 cm). Multiple loops of small bowel and fat within the hernia sac without inflammation. Small fat containing inguinal hernias. Grade 1 L5-S1 anterolisthesis, chronic bilateral L5 pars interarticularis defects. Severe L5-S1 neural foraminal narrowing. IMPRESSION: Enlarging complex ventral hernia, status post herniorrhaphy. Multiple loops of small bowel within the wide necked hernia sac without CT findings of incarceration/ strangulation or bowel obstruction. Grade 1 L5-S1 anterolisthesis, chronic bilateral pars interarticularis defects. Electronically Signed   By: Awilda Metro M.D.   On: 08/13/2016 05:00   Dg Abdomen Acute W/chest  Result Date: 08/13/2016 CLINICAL DATA:  Hernia in the right mid abdomen, getting bigger. Now painful and has diarrhea. Current smoker. EXAM: DG ABDOMEN ACUTE W/ 1V CHEST COMPARISON:  08/11/2016 FINDINGS: Cardiac enlargement without vascular congestion. Atelectasis in the lung bases. No focal consolidation. Appearance is similar to prior study. No blunting of costophrenic angles. No pneumothorax. Scattered gas and stool throughout the colon. No small or large bowel distention. No free intra-abdominal air. No abnormal air-fluid levels. Postoperative changes in the mid abdomen consistent with mesh hernia repair. Surgical clips in the left upper quadrant. No radiopaque stones. Degenerative changes in the spine. IMPRESSION: Cardiac enlargement with atelectasis in the lung bases. No evidence of active pulmonary disease. Normal nonobstructive bowel gas pattern. Electronically Signed    By: Burman Nieves M.D.   On: 08/13/2016 02:59     Dg Chest 2 View  Result Date: 08/11/2016 CLINICAL DATA:  41 y/o  M; fever and cough since yesterday IMPRESSION: Stable cardiomegaly. Right basilar platelike atelectasis. No focal consolidation. Electronically Signed   By: Mitzi Hansen M.D.   On:  08/11/2016 02:26    Procedures Procedures (including critical care time)  Medications Ordered in ED Medications  sodium chloride 0.9 % bolus 1,000 mL (0 mLs Intravenous Stopped 08/13/16 0527)  sodium chloride 0.9 % bolus 1,000 mL (0 mLs Intravenous Stopped 08/13/16 0527)  ondansetron (ZOFRAN) injection 4 mg (4 mg Intravenous Given 08/13/16 0208)     Initial Impression / Assessment and Plan / ED Course  I have reviewed the triage vital signs and the nursing notes.  Pertinent labs & imaging results that were available during my care of the patient were reviewed by me and considered in my medical decision making (see chart for details).  Pt was given IV fluids and IV nausea and pain meds. Xrays obtained to look for obstruction.   Recheck at 3:40 AM patient still complaining a lot of pain. CT of his abdomen was ordered.  5:40 AM we discussed his CT results. Patient was recently diagnosed with the flu and still has some cough which makes the hernia hurt more. He was advised to take Mucinex DM over-the-counter and was prescribed Tessalon Perles for his cough. He was given another couple days off for work to recover from his flu. We discussed following up with his surgeon in [place] about repair of his hernia.  Final Clinical Impressions(s) / ED Diagnoses   Final diagnoses:  Other specified abdominal hernia without obstruction or gangrene  Diarrhea, unspecified type    New Prescriptions New Prescriptions   BENZONATATE (TESSALON) 100 MG CAPSULE    Take 1 or 2 po Q 6hrs for cough   Plan discharge  Devoria AlbeIva Jesselee Poth, MD, Concha PyoFACEP    Laurenashley Viar, MD 08/13/16 [phone]

## 2016-08-29 ENCOUNTER — Ambulatory Visit (HOSPITAL_COMMUNITY)
Admission: RE | Admit: 2016-08-29 | Discharge: 2016-08-29 | Disposition: A | Discharge status: Home / Self Care | Payer: 59 | Source: Ambulatory Visit | Attending: Registered Nurse | Admitting: Registered Nurse

## 2016-08-29 ENCOUNTER — Other Ambulatory Visit (HOSPITAL_COMMUNITY): Payer: Self-pay | Admitting: Registered Nurse

## 2016-08-29 DIAGNOSIS — M545 Low back pain: Secondary | ICD-10-CM

## 2016-08-29 DIAGNOSIS — M47896 Other spondylosis, lumbar region: Secondary | ICD-10-CM | POA: Insufficient documentation

## 2016-08-29 DIAGNOSIS — M4316 Spondylolisthesis, lumbar region: Secondary | ICD-10-CM | POA: Insufficient documentation

## 2016-09-05 ENCOUNTER — Emergency Department (HOSPITAL_COMMUNITY)
Admission: EM | Admit: 2016-09-05 | Discharge: 2016-09-05 | Disposition: A | Discharge status: Home / Self Care | Payer: 59 | Attending: Emergency Medicine | Admitting: Emergency Medicine

## 2016-09-05 ENCOUNTER — Emergency Department (HOSPITAL_COMMUNITY): Payer: 59

## 2016-09-05 ENCOUNTER — Encounter (HOSPITAL_COMMUNITY): Payer: Self-pay | Admitting: *Deleted

## 2016-09-05 DIAGNOSIS — Y999 Unspecified external cause status: Secondary | ICD-10-CM | POA: Diagnosis not present

## 2016-09-05 DIAGNOSIS — Z79899 Other long term (current) drug therapy: Secondary | ICD-10-CM | POA: Insufficient documentation

## 2016-09-05 DIAGNOSIS — S93401A Sprain of unspecified ligament of right ankle, initial encounter: Secondary | ICD-10-CM | POA: Diagnosis not present

## 2016-09-05 DIAGNOSIS — Y92009 Unspecified place in unspecified non-institutional (private) residence as the place of occurrence of the external cause: Secondary | ICD-10-CM | POA: Diagnosis not present

## 2016-09-05 DIAGNOSIS — S99911A Unspecified injury of right ankle, initial encounter: Secondary | ICD-10-CM | POA: Diagnosis present

## 2016-09-05 DIAGNOSIS — X501XXA Overexertion from prolonged static or awkward postures, initial encounter: Secondary | ICD-10-CM | POA: Insufficient documentation

## 2016-09-05 DIAGNOSIS — Y9301 Activity, walking, marching and hiking: Secondary | ICD-10-CM | POA: Diagnosis not present

## 2016-09-05 MED ORDER — HYDROCODONE-ACETAMINOPHEN 5-325 MG PO TABS: 1.0000 | ORAL_TABLET | Freq: Four times a day (QID) | ORAL | 0 refills | Status: DC | PRN

## 2016-09-05 MED ORDER — DICLOFENAC SODIUM 50 MG PO TBEC: 50.0000 mg | DELAYED_RELEASE_TABLET | Freq: Two times a day (BID) | ORAL | 0 refills | Status: DC

## 2016-09-05 MED ORDER — OXYCODONE-ACETAMINOPHEN 5-325 MG PO TABS
1.0000 | ORAL_TABLET | Freq: Once | ORAL | Status: AC
  Administered 2016-09-05: 1 via ORAL
  Filled 2016-09-05: qty 1

## 2016-09-05 NOTE — Progress Notes (Signed)
Orthopedic Tech Progress Note Patient Details:  Sean Murillo 10/10/1975 [id number]  Ortho Devices Type of Ortho Device: Crutches, CAM walker Ortho Device/Splint Location: RLE Ortho Device/Splint Interventions: Ordered, Application   [name], [name] Craig 09/05/2016, 6:45 PM

## 2016-09-05 NOTE — Discharge Instructions (Signed)
Elevate the area as often as possible, wear the boot for comfort. Do not take the narcotic if doing any activity that may cause injury as it will make you sleepy. Follow up with Dr. Charlann Boxerlin. Return here as needed.

## 2016-09-05 NOTE — ED Provider Notes (Signed)
[facility]-EMERGENCY DEPT Provider Note   CSN: [id number] Arrival date & time: 09/05/16  1657  By signing my name below, I, Doreatha Martin, attest that this documentation has been prepared under the direction and in the presence of  [facility] M. Damian Leavell, NP. Electronically Signed: Doreatha Martin, ED Scribe. 09/05/16. 6:19 PM.    History   Chief Complaint Chief Complaint  Patient presents with  . Ankle Pain    HPI Sean Murillo is a 41 y.o. male who presents to the Emergency Department complaining of moderate right ankle pain and swelling s/p injury that occurred today. Pt states he mis-stepped off the edge of a sidewalk, inverted the ankle and heard a "pop". He denies fall, head injury or LOC. Pt states his pain is worsened with weight-bearing and ambulation. Pt denies taking OTC medications at home to improve symptoms.  He also denies numbness, additional injuries.   The history is provided by the patient. No language interpreter was used.  Ankle Pain   The incident occurred 1 to 2 hours ago. The incident occurred at home. Injury mechanism: twisting. The pain is present in the right ankle. The pain is moderate. The pain has been constant since onset. Pertinent negatives include no numbness and no inability to bear weight. He reports no foreign bodies present. The symptoms are aggravated by bearing weight and activity. He has tried nothing for the symptoms. The treatment provided no relief.    Past Medical History:  Diagnosis Date  . PTSD (post-traumatic stress disorder)   . Rocky Mountain spotted fever     Patient Active Problem List   Diagnosis Date Noted  . Morbid obesity (HCC) 04/05/2014  . Partial small bowel obstruction 04/04/2014  . Leukocytosis 04/04/2014    Past Surgical History:  Procedure Laterality Date  . cyst removed from right shoulder    . HERNIA REPAIR    . MANDIBLE SURGERY    . SPLENECTOMY, TOTAL         Home Medications    Prior to Admission medications     Medication Sig Start Date End Date Taking? Authorizing Provider  benzonatate (TESSALON) 100 MG capsule Take 1 or 2 po Q 6hrs for cough 08/13/16   Devoria Albe, MD  cholecalciferol (VITAMIN D) 1000 units tablet Take 2,000 Units by mouth daily.    Historical Provider, MD  diclofenac (VOLTAREN) 50 MG EC tablet Take 1 tablet (50 mg total) by mouth 2 (two) times daily. 09/05/16   Abdulla Pooley Orlene Och, NP  HYDROcodone-acetaminophen (NORCO) 5-325 MG tablet Take 1 tablet by mouth every 6 (six) hours as needed. 09/05/16   Ralyn Stlaurent Orlene Och, NP  lidocaine (LIDODERM) 5 % Place 1 patch onto the skin daily. Remove & Discard patch within 12 hours or as directed by MD 08/10/16   April Palumbo, MD  methocarbamol (ROBAXIN) 500 MG tablet Take 1 tablet (500 mg total) by mouth 2 (two) times daily. 08/10/16   April Palumbo, MD  naproxen (NAPROSYN) 375 MG tablet Take 1 tablet (375 mg total) by mouth 2 (two) times daily. 08/10/16   April Palumbo, MD  vitamin B-12 (CYANOCOBALAMIN) 250 MCG tablet Take 250 mcg by mouth daily.    Historical Provider, MD    Family History Family History  Problem Relation Age of Onset  . Stroke Mother     Social History Social History  Substance Use Topics  . Smoking status: Never Smoker  . Smokeless tobacco: Current User    Types: Chew     Comment: working  on it  . Alcohol use No     Comment: rare     Allergies   Contrast media [iodinated diagnostic agents] and Iohexol   Review of Systems Review of Systems  Gastrointestinal: Negative for nausea and vomiting.  Musculoskeletal: Positive for arthralgias (right ankle) and joint swelling (right ankle).  Skin: Negative for wound.  Neurological: Negative for syncope and numbness.  Psychiatric/Behavioral: The patient is not nervous/anxious.      Physical Exam Updated Vital Signs BP 128/85 (BP Location: Right Arm)   Pulse 92   Temp 98.4 F (36.9 C) (Oral)   Resp 20   Ht 5\' 9"  (1.753 m)   Wt (!) 161.9 kg   SpO2 93%   BMI 52.72 kg/m    Physical Exam  Constitutional: He appears well-developed and well-nourished. No distress.  HENT:  Head: Normocephalic and atraumatic.  Eyes: EOM are normal.  Neck: Neck supple.  Cardiovascular: Normal rate and intact distal pulses.   Pedal pulses 2+. Adequate circulation.   Pulmonary/Chest: Effort normal.  Musculoskeletal: Normal range of motion. He exhibits edema and tenderness.       Right ankle: He exhibits swelling. Tenderness. Achilles tendon normal. Achilles tendon exhibits no pain and no defect.  Plantarflexion and dorsiflexion without difficulty. He does have pain with dorsiflexion. Swelling and tenderness to lateral malleolus. No tenderness over achilles. No defect noted. There is tenderness to the medial malleolus with a small amount of swelling.   Neurological: He is alert.  Skin: Skin is warm and dry. Capillary refill takes less than 2 seconds.  Psychiatric: He has a normal mood and affect. His behavior is normal.  Nursing note and vitals reviewed.    ED Treatments / Results   DIAGNOSTIC STUDIES: Oxygen Saturation is 97% on RA, normal by my interpretation.    COORDINATION OF CARE: 6:17 PM Discussed treatment plan with pt at bedside which includes XR and pt agreed to plan.   Radiology Dg Ankle Complete Right  Result Date: 09/05/2016 CLINICAL DATA:  Right ankle pain and swelling EXAM: RIGHT ANKLE - COMPLETE 3+ VIEW COMPARISON:  04/29/2016 FINDINGS: Soft tissue swelling over the lateral malleolus and dorsum of the midfoot. Ankle mortise, subtalar joint and midfoot articulations are intact without dislocations. Calcaneal enthesophytes are noted along the plantar dorsal aspect. Small accessory ossicle seen adjacent to the cuboid. No acute fracture identified. IMPRESSION: Soft tissue swelling about the right ankle without acute fracture or dislocation. Electronically Signed   By: Tollie Eth M.D.   On: 09/05/2016 18:09    Procedures Procedures (including critical care  time)  Medications Ordered in ED Medications  oxyCODONE-acetaminophen (PERCOCET/ROXICET) 5-325 MG per tablet 1 tablet (1 tablet Oral Given 09/05/16 1823)     Initial Impression / Assessment and Plan / ED Course  I have reviewed the triage vital signs and the nursing notes.  Pertinent imaging results that were available during my care of the patient were reviewed by me and considered in my medical decision making (see chart for details).   Patient X-Ray negative for obvious fracture or dislocation.  Pt advised to follow up with orthopedics. Patient given camwalker and crutches while in ED, conservative therapy recommended and discussed. Patient will be discharged home & is agreeable with above plan. Returns precautions discussed. Pt appears safe for discharge.   Final Clinical Impressions(s) / ED Diagnoses   Final diagnoses:  Sprain of right ankle, unspecified ligament, initial encounter    New Prescriptions Discharge Medication List as of 09/05/2016  6:53 PM    START taking these medications   Details  diclofenac (VOLTAREN) 50 MG EC tablet Take 1 tablet (50 mg total) by mouth 2 (two) times daily., Starting Fri 09/05/2016, Print    HYDROcodone-acetaminophen (NORCO) 5-325 MG tablet Take 1 tablet by mouth every 6 (six) hours as needed., Starting Fri 09/05/2016, Print       I personally performed the services described in this documentation, which was scribed in my presence. The recorded information has been reviewed and is accurate.    [id number] Nut Swamp RoadHope AdwolfM Alixandria Friedt, [place]NP 09/06/16 [id number]    Gwyneth SproutWhitney Plunkett, MD 09/08/16 2055

## 2016-09-05 NOTE — ED Triage Notes (Signed)
The pt is c/o rt ankle pain since he rolled his rt ankle over today while walking.  Unable to walk without difficulty since then

## 2016-10-02 ENCOUNTER — Other Ambulatory Visit: Payer: Self-pay | Admitting: Neurosurgery

## 2016-10-02 DIAGNOSIS — M545 Low back pain, unspecified: Secondary | ICD-10-CM

## 2016-10-16 ENCOUNTER — Ambulatory Visit
Admission: RE | Admit: 2016-10-16 | Discharge: 2016-10-16 | Disposition: A | Discharge status: Home / Self Care | Payer: 59 | Source: Ambulatory Visit | Attending: Neurosurgery | Admitting: Neurosurgery

## 2016-10-16 DIAGNOSIS — M545 Low back pain, unspecified: Secondary | ICD-10-CM

## 2016-10-21 ENCOUNTER — Encounter (HOSPITAL_COMMUNITY): Payer: Self-pay | Admitting: *Deleted

## 2016-10-21 ENCOUNTER — Emergency Department (HOSPITAL_COMMUNITY)
Admission: EM | Admit: 2016-10-21 | Discharge: 2016-10-21 | Disposition: A | Discharge status: Home / Self Care | Payer: 59 | Attending: Emergency Medicine | Admitting: Emergency Medicine

## 2016-10-21 DIAGNOSIS — Z79899 Other long term (current) drug therapy: Secondary | ICD-10-CM | POA: Diagnosis not present

## 2016-10-21 DIAGNOSIS — L551 Sunburn of second degree: Secondary | ICD-10-CM | POA: Diagnosis not present

## 2016-10-21 MED ORDER — SILVER SULFADIAZINE 1 % EX CREA
TOPICAL_CREAM | Freq: Once | CUTANEOUS | Status: AC
  Administered 2016-10-21: 1 via TOPICAL
  Filled 2016-10-21: qty 50

## 2016-10-21 MED ORDER — OXYCODONE-ACETAMINOPHEN 5-325 MG PO TABS
1.0000 | ORAL_TABLET | Freq: Once | ORAL | Status: AC
  Administered 2016-10-21: 1 via ORAL
  Filled 2016-10-21: qty 1

## 2016-10-21 MED ORDER — SILVER SULFADIAZINE 1 % EX CREA: 1.0000 "application " | TOPICAL_CREAM | Freq: Every day | CUTANEOUS | 0 refills | Status: DC

## 2016-10-21 NOTE — ED Provider Notes (Signed)
[facility]-EMERGENCY DEPT Provider Note   CSN: [id number] Arrival date & time: 10/21/16  1818  By signing my name below, I, Sean Murillo, attest that this documentation has been prepared under the direction and in the presence of Freddi Schrager, PA-C. Electronically Signed: Thelma Murillo, Scribe. 10/21/16. 8:11 PM.  History   Chief Complaint Chief Complaint  Patient presents with  . Sunburn  The history is provided by the patient. No language interpreter was used.      HPI Comments: Sean Murillo is a 41 y.o. male who presents to the Emergency Department complaining of a sunburn to his right shoulder and upper arm that happened three days ago. He has an area of painful redness to bilateral upper arms but notes a blistering on his right upper arm as well.  He states yesterday his wife touched his arm and some of his blisters opened up. He denies fever. He has tried tylenol without relief. He has a PSHx of spleen removal.    Past Medical History:  Diagnosis Date  . PTSD (post-traumatic stress disorder)   . Rocky Mountain spotted fever     Patient Active Problem List   Diagnosis Date Noted  . Morbid obesity (HCC) 04/05/2014  . Partial small bowel obstruction (HCC) 04/04/2014  . Leukocytosis 04/04/2014    Past Surgical History:  Procedure Laterality Date  . cyst removed from right shoulder    . HERNIA REPAIR    . MANDIBLE SURGERY    . SPLENECTOMY, TOTAL         Home Medications    Prior to Admission medications   Medication Sig Start Date End Date Taking? Authorizing Provider  benzonatate (TESSALON) 100 MG capsule Take 1 or 2 po Q 6hrs for cough 08/13/16   Devoria Albe, MD  cholecalciferol (VITAMIN D) 1000 units tablet Take 2,000 Units by mouth daily.    Historical Provider, MD  diclofenac (VOLTAREN) 50 MG EC tablet Take 1 tablet (50 mg total) by mouth 2 (two) times daily. 09/05/16   Hope Orlene Och, NP  HYDROcodone-acetaminophen (NORCO) 5-325 MG tablet Take 1 tablet by mouth every 6  (six) hours as needed. 09/05/16   Hope Orlene Och, NP  lidocaine (LIDODERM) 5 % Place 1 patch onto the skin daily. Remove & Discard patch within 12 hours or as directed by MD 08/10/16   April Palumbo, MD  methocarbamol (ROBAXIN) 500 MG tablet Take 1 tablet (500 mg total) by mouth 2 (two) times daily. 08/10/16   April Palumbo, MD  naproxen (NAPROSYN) 375 MG tablet Take 1 tablet (375 mg total) by mouth 2 (two) times daily. 08/10/16   April Palumbo, MD  silver sulfADIAZINE (SILVADENE) 1 % cream Apply 1 application topically daily. 10/21/16   [place] C Hoang Pettingill, PA-C  vitamin B-12 (CYANOCOBALAMIN) 250 MCG tablet Take 250 mcg by mouth daily.    Historical Provider, MD    Family History Family History  Problem Relation Age of Onset  . Stroke Mother     Social History Social History  Substance Use Topics  . Smoking status: Never Smoker  . Smokeless tobacco: Current User    Types: Chew     Comment: working on it  . Alcohol use No     Comment: rare     Allergies   Contrast media [iodinated diagnostic agents] and Iohexol   Review of Systems Review of Systems  Constitutional: Negative for fever.  Skin: Positive for color change and wound.  Neurological: Negative for weakness and numbness.  Physical Exam Updated Vital Signs BP (!) 158/112 (BP Location: Left Arm)   Pulse 91   Resp 18   SpO2 100%   Physical Exam  Constitutional: He appears well-developed and well-nourished. No distress.  HENT:  Head: Normocephalic and atraumatic.  Eyes: Conjunctivae are normal.  Neck: Neck supple.  Cardiovascular: Normal rate, regular rhythm and intact distal pulses.   Pulmonary/Chest: Effort normal.  Musculoskeletal: He exhibits tenderness.  Full range of motion in the bilateral upper extremities.  Neurological: He is alert.  Skin: Skin is warm and dry. He is not diaphoretic.  Partial thickness burns with intact blisters to right upper arm along with an area of blister disruption and exfoliated skin in  the same region. The partial thickness burn surface area is approx. 1-2%. He has superficial burns surrounding partial thickness burns on right upper arm. Superficial burns to left upper arm; superficial burn area is 2-3%. There are no noted circumferential burns. No burns noted to the patient's hands or surrounding a joint.   Psychiatric: He has a normal mood and affect. His behavior is normal.  Nursing note and vitals reviewed.            ED Treatments / Results  DIAGNOSTIC STUDIES: Oxygen Saturation is 100% on RA, normal by my interpretation.    COORDINATION OF CARE: 8:13 PM Discussed treatment plan with pt at bedside and pt agreed to plan.   Labs (all labs ordered are listed, but only abnormal results are displayed) Labs Reviewed - No data to display  EKG  EKG Interpretation None       Radiology No results found.  Procedures .Burn Treatment Date/Time: 10/21/2016 10:07 PM Performed by: Anselm Pancoast Authorized by: Anselm Pancoast   Consent:    Consent obtained:  Verbal   Consent given by:  Patient   Risks discussed:  Bleeding and pain Procedure details:    Total body burn percentage - superficial :  2   Total body burn percentage - partial/full:  1 Burn area 1 details:    Burn depth:  Partial thickness (2nd)   Affected area:  Upper extremity   Upper extremity location:  R shoulder   Debridement performed: yes     Debridement mechanism:  Gauze, scissors and forceps   Indications for debridement: ruptured blisters     Wound base:  Pink   Wound treatment:  Silver sulfadiazine   Dressing:  Non-stick sterile dressing Post-procedure details:    Patient tolerance of procedure:  Tolerated well, no immediate complications   (including critical care time)  Medications Ordered in ED Medications  oxyCODONE-acetaminophen (PERCOCET/ROXICET) 5-325 MG per tablet 1 tablet (1 tablet Oral Given 10/21/16 2042)  silver sulfADIAZINE (SILVADENE) 1 % cream (1 application  Topical Given 10/21/16 2220)     Initial Impression / Assessment and Plan / ED Course  I have reviewed the triage vital signs and the nursing notes.  Pertinent labs & imaging results that were available during my care of the patient were reviewed by me and considered in my medical decision making (see chart for details).      Patient presents with sunburn. Debridement as noted above required. No indication for emergent transfer to burn center. Follow-up with burn care specialist outpatient. The patient was given instructions for home care as well as return precautions. Patient voices understanding of these instructions, accepts the plan, and is comfortable with discharge.  Findings and plan of care discussed with Melene Plan, DO.   Vitals:  10/21/16 1831 10/21/16 2220  BP: (!) 158/112 (!) 144/90  Pulse: 91 89  Resp: 18 20  SpO2: 100% 96%     Final Clinical Impressions(s) / ED Diagnoses   Final diagnoses:  Second degree sunburn    New Prescriptions Discharge Medication List as of 10/21/2016 10:09 PM    START taking these medications   Details  silver sulfADIAZINE (SILVADENE) 1 % cream Apply 1 application topically daily., Starting Tue 10/21/2016, Print      I personally performed the services described in this documentation, which was scribed in my presence. The recorded information has been reviewed and is accurate.    Anselm Pancoast, PA-C 10/22/16 0139    Melene Plan, DO 10/22/16 1507

## 2016-10-21 NOTE — ED Triage Notes (Signed)
Pt presents with second degree sun burn to his R shoulder/R arm.  Large blisters noted.

## 2016-10-21 NOTE — ED Notes (Signed)
Shawn, PA is going to debride his right upper arm where he has skin that is not completely attached.

## 2016-10-21 NOTE — Discharge Instructions (Addendum)
Keep the burn clean and dry. Change the dressing daily or whenever wet or soiled. Apply Silvadene cream daily. Follow-up with the burn specialist. Call the number provided to set up an appointment. Return to the ED for worsening symptoms.

## 2017-01-23 ENCOUNTER — Emergency Department (HOSPITAL_COMMUNITY): Payer: 59

## 2017-01-23 ENCOUNTER — Encounter (HOSPITAL_COMMUNITY): Payer: Self-pay | Admitting: Emergency Medicine

## 2017-01-23 ENCOUNTER — Emergency Department (HOSPITAL_COMMUNITY)
Admission: EM | Admit: 2017-01-23 | Discharge: 2017-01-23 | Disposition: A | Discharge status: Home / Self Care | Payer: 59 | Attending: Emergency Medicine | Admitting: Emergency Medicine

## 2017-01-23 DIAGNOSIS — Z79899 Other long term (current) drug therapy: Secondary | ICD-10-CM | POA: Insufficient documentation

## 2017-01-23 DIAGNOSIS — W108XXA Fall (on) (from) other stairs and steps, initial encounter: Secondary | ICD-10-CM | POA: Insufficient documentation

## 2017-01-23 DIAGNOSIS — Y999 Unspecified external cause status: Secondary | ICD-10-CM | POA: Diagnosis not present

## 2017-01-23 DIAGNOSIS — M545 Low back pain: Secondary | ICD-10-CM | POA: Diagnosis present

## 2017-01-23 DIAGNOSIS — Y929 Unspecified place or not applicable: Secondary | ICD-10-CM | POA: Insufficient documentation

## 2017-01-23 DIAGNOSIS — Y939 Activity, unspecified: Secondary | ICD-10-CM | POA: Insufficient documentation

## 2017-01-23 DIAGNOSIS — S20222A Contusion of left back wall of thorax, initial encounter: Secondary | ICD-10-CM | POA: Diagnosis not present

## 2017-01-23 DIAGNOSIS — W19XXXA Unspecified fall, initial encounter: Secondary | ICD-10-CM

## 2017-01-23 MED ORDER — IBUPROFEN 400 MG PO TABS
600.0000 mg | ORAL_TABLET | Freq: Once | ORAL | Status: AC
  Administered 2017-01-23: 600 mg via ORAL
  Filled 2017-01-23: qty 1

## 2017-01-23 MED ORDER — IBUPROFEN 600 MG PO TABS: 600.0000 mg | ORAL_TABLET | Freq: Four times a day (QID) | ORAL | 0 refills | Status: DC | PRN

## 2017-01-23 MED ORDER — METHOCARBAMOL 500 MG PO TABS: 500.0000 mg | ORAL_TABLET | Freq: Every evening | ORAL | 0 refills | Status: DC | PRN

## 2017-01-23 MED ORDER — TRAMADOL HCL 50 MG PO TABS: 50.0000 mg | ORAL_TABLET | Freq: Four times a day (QID) | ORAL | 0 refills | Status: DC | PRN

## 2017-01-23 MED ORDER — HYDROCODONE-ACETAMINOPHEN 5-325 MG PO TABS
1.0000 | ORAL_TABLET | Freq: Once | ORAL | Status: AC
  Administered 2017-01-23: 1 via ORAL
  Filled 2017-01-23: qty 1

## 2017-01-23 NOTE — ED Provider Notes (Signed)
[facility]-EMERGENCY DEPT Provider Note   CSN: [id number] Arrival date & time: 01/23/17  2536   By signing my name below, I, Clarisse Gouge, attest that this documentation has been prepared under the direction and in the presence of Terance Hart, PA-C. Electronically signed, Clarisse Gouge, ED Scribe. 01/23/17. 9:30 AM.   History   Chief Complaint Chief Complaint  Patient presents with  . Back Pain  . Fall   The history is provided by the patient and medical records. No language interpreter was used.    Sean Murillo is a 41 y.o. male with h/o morbid obesity and reported back pain presenting to the Emergency Department concerning L lower back pain s/p a fall that occurred this moring. He states a step broke underneath him and he fell on his back onto concrete. No head trauma/ LOC; pt states he was able to ambulate following the fall. He describes 10/10, constant aches worse with ambulation and palpation. Pt states he sees Dr. Bonnee Quin of neurology for chronic back pain. Upcoming F/U noted 02/09/2017. Constant lifting noted at work. No weakness, numbness, tingling, incontinence, gait problem, groin tingling, lower extremity weakness, dysuria or any other complaints noted at this time.   Past Medical History:  Diagnosis Date  . PTSD (post-traumatic stress disorder)   . Rocky Mountain spotted fever     Patient Active Problem List   Diagnosis Date Noted  . Morbid obesity (HCC) 04/05/2014  . Partial small bowel obstruction (HCC) 04/04/2014  . Leukocytosis 04/04/2014    Past Surgical History:  Procedure Laterality Date  . cyst removed from right shoulder    . HERNIA REPAIR    . MANDIBLE SURGERY    . SPLENECTOMY, TOTAL         Home Medications    Prior to Admission medications   Medication Sig Start Date End Date Taking? Authorizing Provider  benzonatate (TESSALON) 100 MG capsule Take 1 or 2 po Q 6hrs for cough 08/13/16   Devoria Albe, MD  cholecalciferol (VITAMIN D) 1000 units  tablet Take 2,000 Units by mouth daily.    [provider]  diclofenac (VOLTAREN) 50 MG EC tablet Take 1 tablet (50 mg total) by mouth 2 (two) times daily. 09/05/16   Janne Napoleon, NP  HYDROcodone-acetaminophen (NORCO) 5-325 MG tablet Take 1 tablet by mouth every 6 (six) hours as needed. 09/05/16   Janne Napoleon, NP  lidocaine (LIDODERM) 5 % Place 1 patch onto the skin daily. Remove & Discard patch within 12 hours or as directed by MD 08/10/16   Nicanor Alcon, April, MD  methocarbamol (ROBAXIN) 500 MG tablet Take 1 tablet (500 mg total) by mouth 2 (two) times daily. 08/10/16   Palumbo, April, MD  naproxen (NAPROSYN) 375 MG tablet Take 1 tablet (375 mg total) by mouth 2 (two) times daily. 08/10/16   Palumbo, April, MD  silver sulfADIAZINE (SILVADENE) 1 % cream Apply 1 application topically daily. 10/21/16   Joy, Shawn C, PA-C  vitamin B-12 (CYANOCOBALAMIN) 250 MCG tablet Take 250 mcg by mouth daily.    [provider]    Family History Family History  Problem Relation Age of Onset  . Stroke Mother     Social History Social History  Substance Use Topics  . Smoking status: Never Smoker  . Smokeless tobacco: Current User    Types: Chew     Comment: working on it  . Alcohol use No     Comment: rare     Allergies   Contrast  media [iodinated diagnostic agents] and Iohexol   Review of Systems Review of Systems  Constitutional: Negative for fever.  Gastrointestinal: Negative for nausea and vomiting.  Genitourinary: Negative for difficulty urinating and dysuria.  Musculoskeletal: Positive for back pain. Negative for gait problem.  Skin: Negative for color change and wound.  Neurological: Negative for weakness and numbness.     Physical Exam Updated Vital Signs BP (!) 142/87 (BP Location: Left Arm)   Pulse 95   Temp 97.9 F (36.6 C) (Oral)   Resp 18   SpO2 96%   Physical Exam  Constitutional: He is oriented to person, place, and time. He appears well-developed and  well-nourished. No distress.  Morbidly obese  HENT:  Head: Normocephalic and atraumatic.  Eyes: Pupils are equal, round, and reactive to light. Conjunctivae and EOM are normal. Right eye exhibits no discharge. Left eye exhibits no discharge. No scleral icterus.  Neck: Normal range of motion.  Cardiovascular: Normal rate.   Pulmonary/Chest: Effort normal. No respiratory distress.  Abdominal: He exhibits no distension.  Musculoskeletal: Normal range of motion. He exhibits tenderness.  Back: Inspection: No masses, deformity, or rash Palpation: No midline spinal tenderness. L lumbar paraspinal muscle tenderness. Strength: 5/5 in lower extremities and normal plantar and dorsiflexion Sensation: Intact sensation with light touch in lower extremities bilaterally Reflexes: Patellar reflex is 2+ bilaterally SLR: Negative seated straight leg raise Gait: Antalgic gait   Neurological: He is alert and oriented to person, place, and time.  Skin: Skin is warm and dry.  Psychiatric: He has a normal mood and affect. His behavior is normal.  Nursing note and vitals reviewed.    ED Treatments / Results  DIAGNOSTIC STUDIES: Oxygen Saturation is 96% on RA, NL by my interpretation.    COORDINATION OF CARE: 9:27 AM-Discussed next steps with pt. Pt verbalized understanding and is agreeable with the plan. Will Rx medications. Pt prepared for d/c, advised of symptomatic care at home, F/U instructions and return precautions.    Labs (all labs ordered are listed, but only abnormal results are displayed) Labs Reviewed - No data to display  EKG  EKG Interpretation None       Radiology Dg Lumbar Spine 2-3 Views  Result Date: 01/23/2017 CLINICAL DATA:  Low back pain.  Fall this morning. EXAM: LUMBAR SPINE - 2-3 VIEW COMPARISON:  MRI 10/16/2016 FINDINGS: Bilateral L5 pars defects again noted with 5 mm anterolisthesis of L5 on S1, stable. No acute fracture. Disc spaces are maintained. SI joints are  symmetric and unremarkable. IMPRESSION: Bilateral L5 pars defects with grade 1 anterolisthesis. No acute findings. Electronically Signed   By: Charlett NoseKevin  Dover M.D.   On: 01/23/2017 07:21    Procedures Procedures (including critical care time)  Medications Ordered in ED Medications  HYDROcodone-acetaminophen (NORCO/VICODIN) 5-325 MG per tablet 1 tablet (1 tablet Oral Given 01/23/17 0937)  ibuprofen (ADVIL,MOTRIN) tablet 600 mg (600 mg Oral Given 01/23/17 0937)     Initial Impression / Assessment and Plan / ED Course  I have reviewed the triage vital signs and the nursing notes.  Pertinent labs & imaging results that were available during my care of the patient were reviewed by me and considered in my medical decision making (see chart for details).  41 year old male presents with low back pain s/p mechanical fall. Neuro exam is normal. Xrays are negative for acute pathology. Will treat pain and have him follow up with neurosurgery. Return precautions given.   Final Clinical Impressions(s) / ED Diagnoses  Final diagnoses:  Fall, initial encounter  Back contusion, left, initial encounter    New Prescriptions New Prescriptions   No medications on file   I personally performed the services described in this documentation, which was scribed in my presence. The recorded information has been reviewed and is accurate.    Bethel BornGekas, Lynsi Dooner Marie, PA-C 01/24/17 [id number]    Jacalyn LefevreHaviland, Julie, MD 01/25/17 [phone]

## 2017-01-23 NOTE — Discharge Instructions (Signed)
Take Ibuprofen three times daily for the next week. Take this medicine with food. °Take muscle relaxer at bedtime to help you sleep. This medicine makes you drowsy so do not take before driving or work °Use a heating pad for sore muscles - use for 20 minutes several times a day °Return for worsening symptoms ° °

## 2017-01-23 NOTE — ED Triage Notes (Signed)
Fall this morning, landed on concrete. 10/10 pain, ambulatory. Hx PTSD

## 2017-01-27 ENCOUNTER — Other Ambulatory Visit: Payer: Self-pay | Admitting: Neurosurgery

## 2017-01-27 DIAGNOSIS — G8929 Other chronic pain: Secondary | ICD-10-CM

## 2017-01-27 DIAGNOSIS — M545 Low back pain: Principal | ICD-10-CM

## 2017-02-03 ENCOUNTER — Ambulatory Visit
Admission: RE | Admit: 2017-02-03 | Discharge: 2017-02-03 | Disposition: A | Discharge status: Home / Self Care | Payer: 59 | Source: Ambulatory Visit | Attending: Neurosurgery | Admitting: Neurosurgery

## 2017-02-03 ENCOUNTER — Other Ambulatory Visit: Payer: Self-pay | Admitting: Neurosurgery

## 2017-02-03 DIAGNOSIS — M545 Low back pain, unspecified: Secondary | ICD-10-CM

## 2017-02-03 DIAGNOSIS — G8929 Other chronic pain: Secondary | ICD-10-CM

## 2017-02-03 MED ORDER — METHYLPREDNISOLONE ACETATE 40 MG/ML INJ SUSP (RADIOLOG
120.0000 mg | Freq: Once | INTRAMUSCULAR | Status: AC
  Administered 2017-02-03: 120 mg via EPIDURAL

## 2017-02-03 MED ORDER — IOPAMIDOL (ISOVUE-M 200) INJECTION 41%
1.0000 mL | Freq: Once | INTRAMUSCULAR | Status: AC
  Administered 2017-02-03: 1 mL via EPIDURAL

## 2017-02-03 NOTE — Discharge Instructions (Signed)

## 2017-02-09 ENCOUNTER — Emergency Department (HOSPITAL_COMMUNITY)
Admission: EM | Admit: 2017-02-09 | Discharge: 2017-02-09 | Disposition: A | Discharge status: Home / Self Care | Payer: 59 | Attending: Emergency Medicine | Admitting: Emergency Medicine

## 2017-02-09 ENCOUNTER — Encounter (HOSPITAL_COMMUNITY): Payer: Self-pay | Admitting: Emergency Medicine

## 2017-02-09 ENCOUNTER — Emergency Department (HOSPITAL_COMMUNITY): Payer: 59

## 2017-02-09 DIAGNOSIS — K439 Ventral hernia without obstruction or gangrene: Secondary | ICD-10-CM

## 2017-02-09 DIAGNOSIS — R109 Unspecified abdominal pain: Secondary | ICD-10-CM

## 2017-02-09 DIAGNOSIS — F17220 Nicotine dependence, chewing tobacco, uncomplicated: Secondary | ICD-10-CM | POA: Diagnosis not present

## 2017-02-09 DIAGNOSIS — R740 Nonspecific elevation of levels of transaminase and lactic acid dehydrogenase [LDH]: Secondary | ICD-10-CM | POA: Insufficient documentation

## 2017-02-09 DIAGNOSIS — Z79899 Other long term (current) drug therapy: Secondary | ICD-10-CM | POA: Diagnosis not present

## 2017-02-09 DIAGNOSIS — R7401 Elevation of levels of liver transaminase levels: Secondary | ICD-10-CM

## 2017-02-09 LAB — URINALYSIS, ROUTINE W REFLEX MICROSCOPIC
Bilirubin Urine: NEGATIVE
Glucose, UA: NEGATIVE mg/dL
Hgb urine dipstick: NEGATIVE
Ketones, ur: NEGATIVE mg/dL
LEUKOCYTES UA: NEGATIVE
NITRITE: NEGATIVE
PH: 8 (ref 5.0–8.0)
Protein, ur: NEGATIVE mg/dL
SPECIFIC GRAVITY, URINE: 1.011 (ref 1.005–1.030)

## 2017-02-09 LAB — COMPREHENSIVE METABOLIC PANEL
ALBUMIN: 3.7 g/dL (ref 3.5–5.0)
ALT: 75 U/L — ABNORMAL HIGH (ref 17–63)
ANION GAP: 8 (ref 5–15)
AST: 48 U/L — ABNORMAL HIGH (ref 15–41)
Alkaline Phosphatase: 69 U/L (ref 38–126)
BILIRUBIN TOTAL: 1 mg/dL (ref 0.3–1.2)
BUN: 7 mg/dL (ref 6–20)
CHLORIDE: 98 mmol/L — AB (ref 101–111)
CO2: 33 mmol/L — ABNORMAL HIGH (ref 22–32)
Calcium: 9.2 mg/dL (ref 8.9–10.3)
Creatinine, Ser: 0.74 mg/dL (ref 0.61–1.24)
GFR calc Af Amer: >60 mL/min (ref >60)
GFR calc non Af Amer: >60 mL/min (ref >60)
GLUCOSE: 96 mg/dL (ref 65–99)
POTASSIUM: 3.7 mmol/L (ref 3.5–5.1)
Sodium: 139 mmol/L (ref 135–145)
TOTAL PROTEIN: 7.6 g/dL (ref 6.5–8.1)

## 2017-02-09 LAB — CBC
HEMATOCRIT: 45.9 % (ref 39.0–52.0)
HEMOGLOBIN: 16 g/dL (ref 13.0–17.0)
MCH: 28.7 pg (ref 26.0–34.0)
MCHC: 34.9 g/dL (ref 30.0–36.0)
MCV: 82.4 fL (ref 78.0–100.0)
Platelets: 393 10*3/uL (ref 150–400)
RBC: 5.57 MIL/uL (ref 4.22–5.81)
RDW: 14.6 % (ref 11.5–15.5)
WBC: 15.2 10*3/uL — AB (ref 4.0–10.5)

## 2017-02-09 LAB — DIFFERENTIAL
BASOS ABS: 0.2 10*3/uL — AB (ref 0.0–0.1)
BASOS PCT: 1 %
Eosinophils Absolute: 0.6 10*3/uL (ref 0.0–0.7)
Eosinophils Relative: 4 %
LYMPHS PCT: 33 %
Lymphs Abs: 5 10*3/uL — ABNORMAL HIGH (ref 0.7–4.0)
Monocytes Absolute: 1.2 10*3/uL — ABNORMAL HIGH (ref 0.1–1.0)
Monocytes Relative: 8 %
NEUTROS ABS: 8.2 10*3/uL — AB (ref 1.7–7.7)
NEUTROS PCT: 54 %

## 2017-02-09 LAB — I-STAT CG4 LACTIC ACID, ED: Lactic Acid, Venous: 1.78 mmol/L (ref 0.5–1.9)

## 2017-02-09 LAB — LIPASE, BLOOD: LIPASE: 29 U/L (ref 11–51)

## 2017-02-09 MED ORDER — MORPHINE SULFATE (PF) 4 MG/ML IV SOLN
4.0000 mg | Freq: Once | INTRAVENOUS | Status: AC
  Administered 2017-02-09: 4 mg via INTRAVENOUS
  Filled 2017-02-09: qty 1

## 2017-02-09 MED ORDER — ONDANSETRON HCL 4 MG/2ML IJ SOLN
4.0000 mg | Freq: Once | INTRAMUSCULAR | Status: AC
  Administered 2017-02-09: 4 mg via INTRAVENOUS
  Filled 2017-02-09: qty 2

## 2017-02-09 MED ORDER — BARIUM SULFATE 2.1 % PO SUSP
ORAL | Status: AC
  Filled 2017-02-09: qty 1

## 2017-02-09 MED ORDER — SODIUM CHLORIDE 0.9 % IV BOLUS (SEPSIS)
1000.0000 mL | Freq: Once | INTRAVENOUS | Status: AC
  Administered 2017-02-09: 1000 mL via INTRAVENOUS

## 2017-02-09 NOTE — Discharge Instructions (Signed)
The CT scan did not show any signs of obstruction or incarceration did show your persistent ventral hernia. Follow-up with your surgeon as we discussed. Return as needed for worsening symptoms

## 2017-02-09 NOTE — ED Notes (Signed)
Patient is drinking his 2nd cup of contrast states the pain comes and goes

## 2017-02-09 NOTE — ED Provider Notes (Signed)
The CT scan findings were discussed with the patient. The patient has a persistent ventral hernia but no signs of any obstruction or incarceration. Patient lives in [place]. [facility]. He ended up having surgery down in [place] after being referred there by [facility]. We discussed outpatient follow-up with his surgeon in [place].   Linwood DibblesKnapp, Tyrell Seifer, MD 02/09/17 1059

## 2017-02-09 NOTE — ED Triage Notes (Signed)
Pt stated he was watching the news this morning and a sudden sharp pain hit him in his abdomen. Pt states he has a hernia and has had hernia repairs before. Denies nausea currently but had some pta. Denies vomiting or diarrhea.

## 2017-02-09 NOTE — ED Provider Notes (Signed)
[facility]-EMERGENCY DEPT Provider Note   CSN: [id number] Arrival date & time: 02/09/17  0518     History   Chief Complaint Chief Complaint  Patient presents with  . Abdominal Pain    HPI Sean Murillo is a 41 y.o. male.  The history is provided by the patient.  He had sudden onset this morning of severe right mid abdominal pain. Pain comes in waves. He rates pain at 10/10 at baseline, and then gets even more severe with waves. Nothing makes it better, nothing makes it worse. There is associated nausea but no vomiting. He denies fever or chills. He does have a known history of a hernia.  Past Medical History:  Diagnosis Date  . PTSD (post-traumatic stress disorder)   . Rocky Mountain spotted fever     Patient Active Problem List   Diagnosis Date Noted  . Morbid obesity (HCC) 04/05/2014  . Partial small bowel obstruction (HCC) 04/04/2014  . Leukocytosis 04/04/2014    Past Surgical History:  Procedure Laterality Date  . cyst removed from right shoulder    . HERNIA REPAIR    . MANDIBLE SURGERY    . SPLENECTOMY, TOTAL         Home Medications    Prior to Admission medications   Medication Sig Start Date End Date Taking? Authorizing Provider  benzonatate (TESSALON) 100 MG capsule Take 1 or 2 po Q 6hrs for cough 08/13/16   Devoria Albe, MD  cholecalciferol (VITAMIN D) 1000 units tablet Take 2,000 Units by mouth daily.    [provider]  diclofenac (VOLTAREN) 50 MG EC tablet Take 1 tablet (50 mg total) by mouth 2 (two) times daily. 09/05/16   Janne Napoleon, NP  HYDROcodone-acetaminophen (NORCO) 5-325 MG tablet Take 1 tablet by mouth every 6 (six) hours as needed. 09/05/16   Janne Napoleon, NP  ibuprofen (ADVIL,MOTRIN) 600 MG tablet Take 1 tablet (600 mg total) by mouth every 6 (six) hours as needed. 01/23/17   Bethel Born, PA-C  lidocaine (LIDODERM) 5 % Place 1 patch onto the skin daily. Remove & Discard patch within 12 hours or as directed by MD 08/10/16   Nicanor Alcon,  April, MD  methocarbamol (ROBAXIN) 500 MG tablet Take 1 tablet (500 mg total) by mouth at bedtime and may repeat dose one time if needed. 01/23/17   Bethel Born, PA-C  naproxen (NAPROSYN) 375 MG tablet Take 1 tablet (375 mg total) by mouth 2 (two) times daily. 08/10/16   Palumbo, April, MD  silver sulfADIAZINE (SILVADENE) 1 % cream Apply 1 application topically daily. 10/21/16   Joy, Shawn C, PA-C  traMADol (ULTRAM) 50 MG tablet Take 1 tablet (50 mg total) by mouth every 6 (six) hours as needed. 01/23/17   Bethel Born, PA-C  vitamin B-12 (CYANOCOBALAMIN) 250 MCG tablet Take 250 mcg by mouth daily.    [provider]    Family History Family History  Problem Relation Age of Onset  . Stroke Mother     Social History Social History  Substance Use Topics  . Smoking status: Never Smoker  . Smokeless tobacco: Current User    Types: Chew     Comment: working on it  . Alcohol use No     Comment: rare     Allergies   Contrast media [iodinated diagnostic agents] and Iohexol   Review of Systems Review of Systems  All other systems reviewed and are negative.    Physical Exam Updated Vital Signs BP Marland Kitchen)  153/98 (BP Location: Right Arm)   Pulse 81   Temp 98.7 F (37.1 C) (Oral)   Resp 20   Ht 5\' 9"  (1.753 m)   Wt (!) 160.1 kg (353 lb)   SpO2 96%   BMI 52.13 kg/m   Physical Exam  Nursing note and vitals reviewed.  Morbidly obese 41 year old male, resting comfortably and in no acute distress. Vital signs are significant for hypertension. Oxygen saturation is 96%, which is normal. Head is normocephalic and atraumatic. PERRLA, EOMI. Oropharynx is clear. Neck is nontender and supple without adenopathy or JVD. Back is nontender and there is no CVA tenderness. Lungs are clear without rales, wheezes, or rhonchi. Chest is nontender. Heart has regular rate and rhythm without murmur. Abdomen is obese with marked tenderness in the right mid abdomen. There is a hernia  palpable there which is easily reducible, but significant pain persists after reduction of hernia. There are no other masses or hepatosplenomegaly and peristalsis is hypoactive. Extremities have no cyanosis or edema, full range of motion is present. Skin is warm and dry without rash. Neurologic: Mental status is normal, cranial nerves are intact, there are no motor or sensory deficits.  ED Treatments / Results  Labs (all labs ordered are listed, but only abnormal results are displayed) Labs Reviewed  LIPASE, BLOOD  COMPREHENSIVE METABOLIC PANEL  CBC  URINALYSIS, ROUTINE W REFLEX MICROSCOPIC  DIFFERENTIAL  I-STAT CG4 LACTIC ACID, ED    EKG  EKG Interpretation None       Radiology No results found.  Procedures Procedures (including critical care time)  Medications Ordered in ED Medications  ondansetron (ZOFRAN) injection 4 mg (not administered)  sodium chloride 0.9 % bolus 1,000 mL (not administered)  morphine 4 MG/ML injection 4 mg (not administered)     Initial Impression / Assessment and Plan / ED Course  I have reviewed the triage vital signs and the nursing notes.  Pertinent labs & imaging results that were available during my care of the patient were reviewed by me and considered in my medical decision making (see chart for details).  Abdominal pain which seems to be related to ventral hernia. Old records are reviewed, and ventral hernia at been seen on CT scan 6 months ago. He'll be given IV fluids, morphine, ondansetron. Screening labs obtained including lactic acid. He will be sent for CT scan to look for evidence of obstruction or incarceration.  WBC is come back slightly elevated, but it is noted that he has generally had elevated WBCs with previous ED visits. Mild elevation of transaminases noted of uncertain clinical significance. Lactic acid level is normal. CT is still pending. Case is signed out to Dr. Lynelle DoctorKnapp.  Final Clinical Impressions(s) / ED Diagnoses     Final diagnoses:  Abdominal pain, unspecified abdominal location  Ventral hernia without obstruction or gangrene  Elevated transaminase level    New Prescriptions New Prescriptions   No medications on file     Dione BoozeGlick, Javanni Maring, MD 02/09/17 [phone]

## 2017-02-09 NOTE — ED Notes (Signed)
Returned from CT.

## 2017-02-10 ENCOUNTER — Emergency Department (HOSPITAL_COMMUNITY): Payer: 59

## 2017-02-10 ENCOUNTER — Encounter (HOSPITAL_COMMUNITY): Payer: Self-pay | Admitting: Emergency Medicine

## 2017-02-10 ENCOUNTER — Emergency Department (HOSPITAL_COMMUNITY)
Admission: EM | Admit: 2017-02-10 | Discharge: 2017-02-10 | Disposition: A | Discharge status: Home / Self Care | Payer: 59 | Attending: Emergency Medicine | Admitting: Emergency Medicine

## 2017-02-10 DIAGNOSIS — S9031XA Contusion of right foot, initial encounter: Secondary | ICD-10-CM | POA: Insufficient documentation

## 2017-02-10 DIAGNOSIS — W010XXA Fall on same level from slipping, tripping and stumbling without subsequent striking against object, initial encounter: Secondary | ICD-10-CM | POA: Insufficient documentation

## 2017-02-10 DIAGNOSIS — M7660 Achilles tendinitis, unspecified leg: Secondary | ICD-10-CM | POA: Diagnosis not present

## 2017-02-10 DIAGNOSIS — Y999 Unspecified external cause status: Secondary | ICD-10-CM | POA: Insufficient documentation

## 2017-02-10 DIAGNOSIS — Y939 Activity, unspecified: Secondary | ICD-10-CM | POA: Diagnosis not present

## 2017-02-10 DIAGNOSIS — Y929 Unspecified place or not applicable: Secondary | ICD-10-CM | POA: Insufficient documentation

## 2017-02-10 DIAGNOSIS — S93401A Sprain of unspecified ligament of right ankle, initial encounter: Secondary | ICD-10-CM | POA: Diagnosis not present

## 2017-02-10 DIAGNOSIS — S99911A Unspecified injury of right ankle, initial encounter: Secondary | ICD-10-CM | POA: Diagnosis present

## 2017-02-10 MED ORDER — TRAMADOL HCL 50 MG PO TABS: 50.0000 mg | ORAL_TABLET | Freq: Four times a day (QID) | ORAL | 0 refills | Status: DC | PRN

## 2017-02-10 MED ORDER — DICLOFENAC SODIUM 50 MG PO TBEC: 50.0000 mg | DELAYED_RELEASE_TABLET | Freq: Two times a day (BID) | ORAL | 0 refills | Status: DC

## 2017-02-10 NOTE — ED Provider Notes (Signed)
[facility]-EMERGENCY DEPT Provider Note   CSN: [id number] Arrival date & time: 02/10/17  1859   By signing my name below, I, [name], attest that this documentation has been prepared under the direction and in the presence of Kerrie Buffalo, NP Electronically Signed: [name], ED Scribe. 02/10/17. 8:10 PM.  History   Chief Complaint Chief Complaint  Patient presents with  . Foot Injury    HPI Sean Murillo is a 41 y.o. male who presents to the Emergency Department complaining of right foot and ankle injury occurring noon today. Pt reports associated 9/10 right ankle pain, gradually worsening right ankle swelling and right heel pain. Pt has tried ice, elevation, and 600 mg ibuprofen with no relief of his symptoms. He states that he was stepping out of his shed when he mis-stepped and felt a pop to his right ankle. Pt notes that he was wearing tennis shoes during the incident.Marland Kitchen He denies color change, wound, anticoagulant use, and any other symptoms.    The history is provided by the patient. No language interpreter was used.  Foot Injury   The incident occurred 6 to 12 hours ago. The incident occurred at home. Injury mechanism: mis-stepping out of a shed. The pain is present in the right ankle and right foot. The pain is at a severity of 9/10. The pain is severe. The pain has been constant since onset. He reports no foreign bodies present. The symptoms are aggravated by activity and bearing weight. He has tried ice, elevation and NSAIDs for the symptoms. The treatment provided no relief.    Past Medical History:  Diagnosis Date  . PTSD (post-traumatic stress disorder)   . Rocky Mountain spotted fever     Patient Active Problem List   Diagnosis Date Noted  . Morbid obesity (HCC) 04/05/2014  . Partial small bowel obstruction (HCC) 04/04/2014  . Leukocytosis 04/04/2014    Past Surgical History:  Procedure Laterality Date  . cyst removed from right shoulder    . HERNIA REPAIR      . MANDIBLE SURGERY    . SPLENECTOMY, TOTAL         Home Medications    Prior to Admission medications   Medication Sig Start Date End Date Taking? Authorizing Provider  diclofenac (VOLTAREN) 50 MG EC tablet Take 1 tablet (50 mg total) by mouth 2 (two) times daily. 02/10/17   Janne Napoleon, NP  sertraline (ZOLOFT) 50 MG tablet Take 50 mg by mouth daily.    [provider]  traMADol (ULTRAM) 50 MG tablet Take 1 tablet (50 mg total) by mouth every 6 (six) hours as needed. 02/10/17   Janne Napoleon, NP    Family History Family History  Problem Relation Age of Onset  . Stroke Mother     Social History Social History  Substance Use Topics  . Smoking status: Never Smoker  . Smokeless tobacco: Current User    Types: Chew     Comment: working on it  . Alcohol use No     Comment: rare     Allergies   Contrast media [iodinated diagnostic agents] and Iohexol   Review of Systems Review of Systems  HENT: Negative.   Gastrointestinal: Negative for nausea and vomiting.  Musculoskeletal: Positive for arthralgias (right foot and ankle) and joint swelling.  Skin: Negative for color change and wound.  Neurological: Negative for syncope.  All other systems reviewed and are negative.    Physical Exam Updated Vital Signs BP (!) 170/106 (  BP Location: Right Arm)   Pulse 89   Temp (!) 97.3 F (36.3 C) (Oral)   Ht 5\' 9"  (1.753 m)   Wt (!) 353 lb (160.1 kg)   SpO2 95%   BMI 52.13 kg/m   Physical Exam  Constitutional: He is oriented to person, place, and time. No distress.  Morbidly obese  HENT:  Head: Normocephalic and atraumatic.  Eyes: EOM are normal.  Neck: Neck supple.  Cardiovascular: Normal rate.   Pulmonary/Chest: Effort normal.  Musculoskeletal:       Right ankle: He exhibits decreased range of motion (due to pain), swelling and ecchymosis. He exhibits no deformity, no laceration and normal pulse. Tenderness. Achilles tendon normal. Achilles tendon exhibits  normal Thompson's test results.  Pedal pulses 2+ with adequate circulation. Ecchymosis noted at the base of toes. TTP. Dorsal and plantar flexion without difficulty. Thompson's test negative.  Neurological: He is alert and oriented to person, place, and time.  Skin: Skin is warm and dry.  Psychiatric: He has a normal mood and affect. His behavior is normal.  Nursing note and vitals reviewed.   ED Treatments / Results  DIAGNOSTIC STUDIES: Oxygen Saturation is 95% on RA, nl by my interpretation.    COORDINATION OF CARE: 7:56 PM Discussed treatment plan with pt at bedside and pt agreed to plan.   Labs (all labs ordered are listed, but only abnormal results are displayed) Labs Reviewed - No data to display   Radiology  Procedures Procedures (including critical care time)  Medications Ordered in ED Medications - No data to display   Initial Impression / Assessment and Plan / ED Course  I have reviewed the triage vital signs and the nursing notes.  Patient X-Ray negative for obvious fracture or dislocation.  Pt advised to follow up with orthopedics. Patient given cam walker while in ED, conservative therapy recommended and discussed. Patient will be discharged home & is agreeable with above plan. Returns precautions discussed. Pt appears safe for discharge.  Final Clinical Impressions(s) / ED Diagnoses   Final diagnoses:  Sprain of right ankle, unspecified ligament, initial encounter  Contusion of right foot, initial encounter  Achilles tendon pain   NOTE; discussed with the patient elevated BP and need for f/u with PCP. Patient agrees with plan.  New Prescriptions Discharge Medication List as of 02/10/2017  8:55 PM    I personally performed the services described in this documentation, which was scribed in my presence. The recorded information has been reviewed and is accurate.    Kerrie Buffaloeese, Jonee Lamore PenfieldM, NP 02/11/17 0030    Kerrie Buffaloeese, Hitoshi Werts TalcoM, NP 02/11/17 Leanord Hawking0031    Alvira MondaySchlossman,  Erin, MD 02/11/17 [phone]

## 2017-02-10 NOTE — Discharge Instructions (Signed)
[facility] for a follow up appointment.

## 2017-02-10 NOTE — ED Triage Notes (Signed)
Pt from home with c/o right foot pain following a mis-step earlier this evening. Pt reports he felt a pop. Pt is ambulatory with some difficulty and has decreased ROM in affected ankle. Pt has adequate distal pulses.

## 2017-02-10 NOTE — ED Notes (Signed)
Pt to xray

## 2017-02-27 ENCOUNTER — Other Ambulatory Visit: Payer: Self-pay | Admitting: Neurosurgery

## 2017-03-04 ENCOUNTER — Emergency Department (HOSPITAL_COMMUNITY): Payer: 59

## 2017-03-04 ENCOUNTER — Encounter (HOSPITAL_COMMUNITY): Payer: Self-pay

## 2017-03-04 ENCOUNTER — Emergency Department (HOSPITAL_COMMUNITY)
Admission: EM | Admit: 2017-03-04 | Discharge: 2017-03-04 | Disposition: A | Discharge status: Home / Self Care | Payer: 59 | Attending: Emergency Medicine | Admitting: Emergency Medicine

## 2017-03-04 DIAGNOSIS — K436 Other and unspecified ventral hernia with obstruction, without gangrene: Secondary | ICD-10-CM | POA: Insufficient documentation

## 2017-03-04 DIAGNOSIS — F17220 Nicotine dependence, chewing tobacco, uncomplicated: Secondary | ICD-10-CM | POA: Diagnosis not present

## 2017-03-04 DIAGNOSIS — R1030 Lower abdominal pain, unspecified: Secondary | ICD-10-CM | POA: Diagnosis present

## 2017-03-04 LAB — CBC WITH DIFFERENTIAL/PLATELET
BASOS ABS: 0.1 10*3/uL (ref 0.0–0.1)
BASOS PCT: 1 %
EOS PCT: 4 %
Eosinophils Absolute: 0.5 10*3/uL (ref 0.0–0.7)
HCT: 46.8 % (ref 39.0–52.0)
Hemoglobin: 16.3 g/dL (ref 13.0–17.0)
Lymphocytes Relative: 34 %
Lymphs Abs: 4.4 10*3/uL — ABNORMAL HIGH (ref 0.7–4.0)
MCH: 28.8 pg (ref 26.0–34.0)
MCHC: 34.8 g/dL (ref 30.0–36.0)
MCV: 82.7 fL (ref 78.0–100.0)
MONO ABS: 1.4 10*3/uL — AB (ref 0.1–1.0)
Monocytes Relative: 11 %
Neutro Abs: 6.4 10*3/uL (ref 1.7–7.7)
Neutrophils Relative %: 50 %
PLATELETS: 412 10*3/uL — AB (ref 150–400)
RBC: 5.66 MIL/uL (ref 4.22–5.81)
RDW: 14.2 % (ref 11.5–15.5)
WBC: 12.8 10*3/uL — ABNORMAL HIGH (ref 4.0–10.5)

## 2017-03-04 LAB — COMPREHENSIVE METABOLIC PANEL
ALBUMIN: 4 g/dL (ref 3.5–5.0)
ALT: 78 U/L — ABNORMAL HIGH (ref 17–63)
AST: 46 U/L — AB (ref 15–41)
Alkaline Phosphatase: 62 U/L (ref 38–126)
Anion gap: 11 (ref 5–15)
BUN: 9 mg/dL (ref 6–20)
CHLORIDE: 105 mmol/L (ref 101–111)
CO2: 27 mmol/L (ref 22–32)
Calcium: 9.5 mg/dL (ref 8.9–10.3)
Creatinine, Ser: 0.78 mg/dL (ref 0.61–1.24)
GFR calc Af Amer: >60 mL/min (ref >60)
Glucose, Bld: 102 mg/dL — ABNORMAL HIGH (ref 65–99)
POTASSIUM: 3.9 mmol/L (ref 3.5–5.1)
Sodium: 143 mmol/L (ref 135–145)
Total Bilirubin: 0.8 mg/dL (ref 0.3–1.2)
Total Protein: 8.1 g/dL (ref 6.5–8.1)

## 2017-03-04 LAB — LACTIC ACID, PLASMA: Lactic Acid, Venous: 1.7 mmol/L (ref 0.5–1.9)

## 2017-03-04 MED ORDER — HYDROMORPHONE HCL 1 MG/ML IJ SOLN
1.0000 mg | Freq: Once | INTRAMUSCULAR | Status: AC
  Administered 2017-03-04: 1 mg via INTRAVENOUS
  Filled 2017-03-04: qty 1

## 2017-03-04 MED ORDER — ONDANSETRON HCL 4 MG PO TABS: 4.0000 mg | ORAL_TABLET | Freq: Three times a day (TID) | ORAL | 0 refills | Status: DC | PRN

## 2017-03-04 MED ORDER — SODIUM CHLORIDE 0.9 % IV BOLUS (SEPSIS)
1000.0000 mL | Freq: Once | INTRAVENOUS | Status: AC
  Administered 2017-03-04: 1000 mL via INTRAVENOUS

## 2017-03-04 NOTE — ED Triage Notes (Signed)
Pt reports has hernia and has had pain x 1 hour.  Reports has had it repaired in the past but needs it repaired again.  Denies any n/v

## 2017-03-04 NOTE — ED Provider Notes (Signed)
Emergency Department Provider Note   I have reviewed the triage vital signs and the nursing notes.   HISTORY  Chief Complaint Abdominal Pain   HPI Sean Murillo is a 41 y.o. male with a history of a ventral hernia status post repair by surgeon in [place] presents immersed department today with hernia pain. Patient states that the pain started about 1-1-1/2 hours ago.It severe located over his right anterior abdomen with a sharp component. No nausea vomiting. No diarrhea or constipation. No fevers. States this is abnormal time before and he was seen in the emergency department for. No recent sick contacts. No trauma. No other associated modifying symptoms. Hasn't tried anything for the symptoms yet.   Past Medical History:  Diagnosis Date  . PTSD (post-traumatic stress disorder)   . Rocky Mountain spotted fever     Patient Active Problem List   Diagnosis Date Noted  . Morbid obesity (HCC) 04/05/2014  . Partial small bowel obstruction (HCC) 04/04/2014  . Leukocytosis 04/04/2014    Past Surgical History:  Procedure Laterality Date  . cyst removed from right shoulder    . HERNIA REPAIR    . MANDIBLE SURGERY    . SPLENECTOMY, TOTAL      Current Outpatient Rx  . Order #: [id number] Class: Historical Med  . Order #: [id number] Class: Historical Med  . Order #: [id number] Class: Print    Allergies Contrast media [iodinated diagnostic agents] and Iohexol  Family History  Problem Relation Age of Onset  . Stroke Mother     Social History Social History  Substance Use Topics  . Smoking status: Never Smoker  . Smokeless tobacco: Current User    Types: Chew     Comment: working on it  . Alcohol use No     Comment: rare    Review of Systems  All other systems negative except as documented in the HPI. All pertinent positives and negatives as reviewed in the HPI. ____________________________________________  PHYSICAL EXAM:  VITAL SIGNS: ED Triage Vitals  [03/04/17 1536]  Enc Vitals Group     BP (!) 134/94     Pulse Rate 86     Resp 18     Temp 98.2 F (36.8 C)     Temp Source Oral     SpO2 96 %     Weight (!) 363 lb (164.7 kg)     Height 5\' 9"  (1.753 m)     Head Circumference      Peak Flow      Pain Score 10     Pain Loc      Pain Edu?      Excl. in GC?     Constitutional: Alert and oriented. Well appearing and in no acute distress. Eyes: Conjunctivae are normal. PERRL. EOMI. Head: Atraumatic. Nose: No congestion/rhinnorhea. Mouth/Throat: Mucous membranes are moist.  Oropharynx non-erythematous. Neck: No stridor.  No meningeal signs.   Cardiovascular: Normal rate, regular rhythm. Good peripheral circulation. Grossly normal heart sounds.   Respiratory: Normal respiratory effort.  No retractions. Lungs CTAB. Gastrointestinal: Soft and nontender. No distention. Ventral hernia without erythema, distractible tenderness to palpation.  Musculoskeletal: No lower extremity tenderness nor edema. No gross deformities of extremities. Neurologic:  Normal speech and language. No gross focal neurologic deficits are appreciated.  Skin:  Skin is warm, dry and intact. No rash noted.  ____________________________________________   LABS (all labs ordered are listed, but only abnormal results are displayed)  Labs Reviewed  CBC WITH DIFFERENTIAL/PLATELET - Abnormal; Notable for  the following:       Result Value   WBC 12.8 (*)    Platelets 412 (*)    Lymphs Abs 4.4 (*)    Monocytes Absolute 1.4 (*)    All other components within normal limits  COMPREHENSIVE METABOLIC PANEL - Abnormal; Notable for the following:    Glucose, Bld 102 (*)    AST 46 (*)    ALT 78 (*)    All other components within normal limits  LACTIC ACID, PLASMA  ____________________________________________  RADIOLOGY  Ct Abdomen Pelvis Wo Contrast  Result Date: 03/04/2017 CLINICAL DATA:  Abdominal pain for 1 hour. History of hernia repair. EXAM: CT ABDOMEN AND PELVIS  WITHOUT CONTRAST TECHNIQUE: Multidetector CT imaging of the abdomen and pelvis was performed following the standard protocol without IV contrast. COMPARISON:  CT abdomen and pelvis February 09, 2017 FINDINGS: Large body habitus results in overall noisy image quality. LOWER CHEST: Bibasilar atelectasis. The visualized heart size is normal. Prominent epicardial fat pad. No pericardial effusion. HEPATOBILIARY: Normal. PANCREAS: Normal. SPLEEN: Status post splenectomy.  Splenosis. ADRENALS/URINARY TRACT: Kidneys are orthotopic, demonstrating normal size and morphology. No nephrolithiasis, hydronephrosis; limited assessment for renal masses on this nonenhanced examination. The unopacified ureters are normal in course and caliber. Urinary bladder is partially distended and unremarkable. Normal adrenal glands. STOMACH/BOWEL: In anterior abdominal wall ligamentous laxity. Two adjacent wide necked ventral hernias, smaller neck is 4.2 cm. Small bowel dilated to 3 cm proximal to the hernia sac. Small bowel courses into the hernia sac with multiple transition points as the small bowel courses in and out of the sacs. Additional fat containing ventral hernia without small bowel. Mild colonic diverticulosis. Anti mesenteric transverse colon within hernia sac. VASCULAR/LYMPHATIC: Aortoiliac vessels are normal in course and caliber. Mild calcific atherosclerosis. No lymphadenopathy by CT size criteria. REPRODUCTIVE: Normal. OTHER: No intraperitoneal free fluid or free air. Phleboliths in the pelvis. MUSCULOSKELETAL: Non-acute. Status post abdominal wall herniorrhaphy, rectus abdominis diastases. Small fat containing inguinal hernias. Chronic bilateral L5 pars interarticularis defects with minimal grade 1 L5-S1 anterolisthesis. IMPRESSION: Early/low-grade small bowel obstruction with transition point associated with wide necked ventral hernias, possible adhesions. No CT findings of incarceration or strangulation. Status post abdominal  wall herniorrhaphy. Aortic Atherosclerosis (ICD10-I70.0). Electronically Signed   By: Awilda Metroourtnay  Bloomer M.D.   On: 03/04/2017 18:07   ____________________________________________   PROCEDURES  Procedure(s) performed:   Hernia reduction Date/Time: 03/05/2017 12:24 AM Performed by: Marily MemosMESNER, Mira Balon Authorized by: Marily MemosMESNER, Jacson Rapaport  Consent: Verbal consent obtained. Patient understanding: patient states understanding of the procedure being performed Patient consent: the patient's understanding of the procedure matches consent given Patient identity confirmed: verbally with patient Time out: Immediately prior to procedure a "time out" was called to verify the correct patient, procedure, equipment, support staff and site/side marked as required. Local anesthesia used: no  Anesthesia: Local anesthesia used: no  Sedation: Patient sedated: no Patient tolerance: Patient tolerated the procedure well with no immediate complications Comments: . hernia reduced using direct pressure, ice, pain medication in Trendelenburg position. Patient tolerating by mouth and also able to pass gas afterwards.    ____________________________________________   INITIAL IMPRESSION / ASSESSMENT AND PLAN / ED COURSE  Pertinent labs & imaging results that were available during my care of the patient were reviewed by me and considered in my medical decision making (see chart for details).  Will eval for obstruction/incarceration with labs and ct. Not able to reduce fully on initial exam, will give ice/pain meds/fluid/trendelenburg and try again.  CT with evidence of obstruction. Subsequently was able to reduce the hernia Patient observed for another hour afterwards was tolerating by mouth, passing gas and significant reduction in his pain. Suspect obstruction has improved. We'll DC to follow with surgery.  ____________________________________________  FINAL CLINICAL IMPRESSION(S) / ED DIAGNOSES  Final diagnoses:    Ventral hernia with obstruction and without gangrene    MEDICATIONS GIVEN DURING THIS VISIT:  Medications  HYDROmorphone (DILAUDID) injection 1 mg (1 mg Intravenous Given 03/04/17 1613)  sodium chloride 0.9 % bolus 1,000 mL (0 mLs Intravenous Stopped 03/04/17 1717)  HYDROmorphone (DILAUDID) injection 1 mg (1 mg Intravenous Given 03/04/17 1820)  HYDROmorphone (DILAUDID) injection 1 mg (1 mg Intravenous Given 03/04/17 1959)    NEW OUTPATIENT MEDICATIONS STARTED DURING THIS VISIT:  Discharge Medication List as of 03/04/2017 11:13 PM      Note:  This document was prepared using Dragon voice recognition software and may include unintentional dictation errors.    Eoghan Belcher, Barbara Cower, MD 03/05/17 [id number]

## 2017-03-04 NOTE — ED Notes (Signed)
Going to CT 

## 2017-03-04 NOTE — ED Notes (Signed)
ED Provider at bedside. 

## 2017-03-09 MED FILL — Ondansetron HCl Tab 4 MG: ORAL | Qty: 4 | Status: AC

## 2017-03-09 NOTE — Pre-Procedure Instructions (Signed)
Gwenyth BenderMichael Gherardi  03/09/2017      [facility] - [place], [place] - [id number] [place] [address] Phone: [phone] Fax: [phone]  [id number] - [place], [place] - [address] AT [facility] [address] [place] [address] Phone: [phone] Fax: [phone]    Your procedure is scheduled on September 14  [facility] Admitting at [phone] A.M.  Call this number if you have problems the morning of surgery:  [phone]   Remember:  Do not eat food or drink liquids after midnight.  Continue all other medications as directed by your physician except follow these medication instructions before surgery    Take these medicines the morning of surgery with A SIP OF WATER  sertraline (ZOLOFT)  7 days prior to surgery STOP taking any Aspirin, Aleve, Naproxen, Ibuprofen, Motrin, Advil, Goody's, BC's, all herbal medications, fish oil, and all vitamins'    Do not wear jewelry  Do not wear lotions, powders, or cologne, or Deodorant.  Men may shave face and neck.  Do not bring valuables to the hospital.  [facility] is not responsible for any belongings or valuables.  Contacts, dentures or bridgework may not be worn into surgery.  Leave your suitcase in the car.  After surgery it may be brought to your room.  For patients admitted to the hospital, discharge time will be determined by your treatment team.  Patients discharged the day of surgery will not be allowed to drive home.   Special instructions:   [place]- Preparing For Surgery  Before surgery, you can play an important role. Because skin is not sterile, your skin needs to be as free of germs as possible. You can reduce the number of germs on your skin by washing with CHG (chlorahexidine gluconate) Soap before surgery.  CHG is an antiseptic cleaner which kills germs and bonds with the skin to continue killing germs even after  washing.  Please do not use if you have an allergy to CHG or antibacterial soaps. If your skin becomes reddened/irritated stop using the CHG.  Do not shave (including legs and underarms) for at least 48 hours prior to first CHG shower. It is OK to shave your face.  Please follow these instructions carefully.   1. Shower the NIGHT BEFORE SURGERY and the MORNING OF SURGERY with CHG.   2. If you chose to wash your hair, wash your hair first as usual with your normal shampoo.  3. After you shampoo, rinse your hair and body thoroughly to remove the shampoo.  4. Use CHG as you would any other liquid soap. You can apply CHG directly to the skin and wash gently with a scrungie or a clean washcloth.   5. Apply the CHG Soap to your body ONLY FROM THE NECK DOWN.  Do not use on open wounds or open sores. Avoid contact with your eyes, ears, mouth and genitals (private parts). Wash genitals (private parts) with your normal soap.  6. Wash thoroughly, paying special attention to the area where your surgery will be performed.  7. Thoroughly rinse your body with warm water from the neck down.  8. DO NOT shower/wash with your normal soap after using and rinsing off the CHG Soap.  9. Pat yourself dry with a CLEAN TOWEL.   10. Wear CLEAN PAJAMAS   11. Place CLEAN SHEETS on your bed the night of your first shower and DO NOT SLEEP  WITH PETS.    Day of Surgery: Do not apply any deodorants/lotions. Please wear clean clothes to the hospital/surgery center.      Please read over the following fact sheets that you were given.

## 2017-03-10 ENCOUNTER — Encounter (HOSPITAL_COMMUNITY)
Admission: RE | Admit: 2017-03-10 | Discharge: 2017-03-10 | Disposition: A | Discharge status: Home / Self Care | Payer: 59 | Source: Ambulatory Visit | Attending: Neurosurgery | Admitting: Neurosurgery

## 2017-03-10 ENCOUNTER — Encounter (HOSPITAL_COMMUNITY): Payer: Self-pay

## 2017-03-10 HISTORY — DX: Unspecified osteoarthritis, unspecified site: M19.90

## 2017-03-10 HISTORY — DX: Sleep apnea, unspecified: G47.30

## 2017-03-10 HISTORY — DX: Depression, unspecified: F32.A

## 2017-03-10 HISTORY — DX: Personal history of urinary calculi: Z87.442

## 2017-03-10 HISTORY — DX: Major depressive disorder, single episode, unspecified: F32.9

## 2017-03-10 LAB — CBC
HEMATOCRIT: 45.1 % (ref 39.0–52.0)
HEMOGLOBIN: 15.9 g/dL (ref 13.0–17.0)
MCH: 28.9 pg (ref 26.0–34.0)
MCHC: 35.3 g/dL (ref 30.0–36.0)
MCV: 82 fL (ref 78.0–100.0)
PLATELETS: 362 10*3/uL (ref 150–400)
RBC: 5.5 MIL/uL (ref 4.22–5.81)
RDW: 14.1 % (ref 11.5–15.5)
WBC: 11.7 10*3/uL — ABNORMAL HIGH (ref 4.0–10.5)

## 2017-03-10 LAB — BASIC METABOLIC PANEL
ANION GAP: 9 (ref 5–15)
BUN: 8 mg/dL (ref 6–20)
CHLORIDE: 105 mmol/L (ref 101–111)
CO2: 25 mmol/L (ref 22–32)
CREATININE: 0.74 mg/dL (ref 0.61–1.24)
Calcium: 9.5 mg/dL (ref 8.9–10.3)
GFR calc non Af Amer: >60 mL/min (ref >60)
Glucose, Bld: 102 mg/dL — ABNORMAL HIGH (ref 65–99)
POTASSIUM: 3.5 mmol/L (ref 3.5–5.1)
Sodium: 139 mmol/L (ref 135–145)

## 2017-03-10 LAB — TYPE AND SCREEN
ABO/RH(D): O POS
Antibody Screen: NEGATIVE

## 2017-03-10 LAB — SURGICAL PCR SCREEN
MRSA, PCR: NEGATIVE
Staphylococcus aureus: POSITIVE — AB

## 2017-03-10 LAB — ABO/RH: ABO/RH(D): O POS

## 2017-03-10 NOTE — Progress Notes (Signed)
Pt. Denies chest complaints.,EKG last done 07/2016 upon presenting to ED with flu like symptoms. Pt. Denies ever having any advanced cardiac testing. Pt. Reports that he is followed by Sean Murillo. [facility] [place] for PCP.

## 2017-03-11 NOTE — Progress Notes (Signed)
Anesthesia Chart Review:  Pt is a 41 year old male scheduled for L5-S1 PLIF on 03/13/2017 with Lisbeth RenshawNeelesh Nundkumar, MD  PMH includes:  OSA, PTSD. Never smoker. BMI 54  Medications reviewed.   BP (!) 137/93   Pulse 85   Temp 36.6 C (Oral)   Resp 18   Ht 5\' 9"  (1.753 m)   Wt (!) 364 lb 3 oz (165.2 kg)   SpO2 97%   BMI 53.78 kg/m   Preoperative labs reviewed.    CXR 08/11/16: Stable cardiomegaly. Right basilar platelike atelectasis. No focal consolidation.  EKG 08/11/16: Sinus tachycardia (113 bpm). Cannot rule out Inferior infarct, age undetermined  If no changes, I anticipate pt can proceed with surgery as scheduled.   Rica Mastngela Kabbe, FNP-BC [facility]/Anesthesiology Phone: [phone] 03/11/2017 11:39 AM

## 2017-03-12 ENCOUNTER — Encounter (HOSPITAL_COMMUNITY): Payer: Self-pay | Admitting: Anesthesiology

## 2017-03-12 NOTE — Anesthesia Preprocedure Evaluation (Addendum)
Anesthesia Evaluation  Patient identified by MRN, date of birth, ID band Patient awake    Reviewed: Allergy & Precautions, NPO status , Patient's Chart, lab work & pertinent test results  History of Anesthesia Complications Negative for: history of anesthetic complications  Airway Mallampati: II  TM Distance: >3 FB Neck ROM: Full    Dental  (+) Chipped, Dental Advisory Given   Pulmonary sleep apnea and Continuous Positive Airway Pressure Ventilation ,    breath sounds clear to auscultation       Cardiovascular negative cardio ROS   Rhythm:Regular Rate:Normal     Neuro/Psych PSYCHIATRIC DISORDERS (PTSD) Depression Back pain    GI/Hepatic negative GI ROS, Neg liver ROS,   Endo/Other  Morbid obesity  Renal/GU      Musculoskeletal  (+) Arthritis ,   Abdominal (+) + obese,   Peds  Hematology   Anesthesia Other Findings   Reproductive/Obstetrics                           Anesthesia Physical Anesthesia Plan  ASA: III  Anesthesia Plan: General   Post-op Pain Management:    Induction: Intravenous  PONV Risk Score and Plan: 3 and Ondansetron, Dexamethasone and Midazolam  Airway Management Planned: Oral ETT  Additional Equipment:   Intra-op Plan:   Post-operative Plan: Extubation in OR  Informed Consent: I have reviewed the patients History and Physical, chart, labs and discussed the procedure including the risks, benefits and alternatives for the proposed anesthesia with the patient or authorized representative who has indicated his/her understanding and acceptance.   Dental advisory given  Plan Discussed with: CRNA and Surgeon  Anesthesia Plan Comments: (Plan routine monitors, GETA)        Anesthesia Quick Evaluation

## 2017-03-13 ENCOUNTER — Inpatient Hospital Stay (HOSPITAL_COMMUNITY): Payer: 59 | Admitting: Anesthesiology

## 2017-03-13 ENCOUNTER — Inpatient Hospital Stay (HOSPITAL_COMMUNITY)
Admission: RE | Admit: 2017-03-13 | Discharge: 2017-03-14 | DRG: 454 | Disposition: A | Discharge status: Home / Self Care | Payer: 59 | Source: Ambulatory Visit | Attending: Neurosurgery | Admitting: Neurosurgery

## 2017-03-13 ENCOUNTER — Inpatient Hospital Stay (HOSPITAL_COMMUNITY): Payer: 59

## 2017-03-13 ENCOUNTER — Inpatient Hospital Stay (HOSPITAL_COMMUNITY)
Admission: RE | Disposition: A | Discharge status: Home / Self Care | Payer: Self-pay | Source: Ambulatory Visit | Attending: Neurosurgery

## 2017-03-13 ENCOUNTER — Inpatient Hospital Stay (HOSPITAL_COMMUNITY): Payer: 59 | Admitting: Emergency Medicine

## 2017-03-13 ENCOUNTER — Encounter (HOSPITAL_COMMUNITY): Payer: Self-pay

## 2017-03-13 DIAGNOSIS — G4730 Sleep apnea, unspecified: Secondary | ICD-10-CM | POA: Diagnosis present

## 2017-03-13 DIAGNOSIS — M4316 Spondylolisthesis, lumbar region: Secondary | ICD-10-CM | POA: Diagnosis present

## 2017-03-13 DIAGNOSIS — M4317 Spondylolisthesis, lumbosacral region: Secondary | ICD-10-CM

## 2017-03-13 DIAGNOSIS — F329 Major depressive disorder, single episode, unspecified: Secondary | ICD-10-CM | POA: Diagnosis present

## 2017-03-13 DIAGNOSIS — Z79899 Other long term (current) drug therapy: Secondary | ICD-10-CM

## 2017-03-13 DIAGNOSIS — Z6841 Body Mass Index (BMI) 40.0 and over, adult: Secondary | ICD-10-CM | POA: Diagnosis not present

## 2017-03-13 DIAGNOSIS — M5416 Radiculopathy, lumbar region: Secondary | ICD-10-CM | POA: Diagnosis present

## 2017-03-13 DIAGNOSIS — Z91041 Radiographic dye allergy status: Secondary | ICD-10-CM

## 2017-03-13 DIAGNOSIS — Z720 Tobacco use: Secondary | ICD-10-CM

## 2017-03-13 DIAGNOSIS — Z9081 Acquired absence of spleen: Secondary | ICD-10-CM

## 2017-03-13 DIAGNOSIS — F4310 Post-traumatic stress disorder, unspecified: Secondary | ICD-10-CM | POA: Diagnosis present

## 2017-03-13 HISTORY — DX: Spondylolisthesis, lumbar region: M43.16

## 2017-03-13 HISTORY — PX: BACK SURGERY: SHX140

## 2017-03-13 SURGERY — POSTERIOR LUMBAR FUSION 1 LEVEL
Anesthesia: General

## 2017-03-13 MED ORDER — DEXAMETHASONE SODIUM PHOSPHATE 10 MG/ML IJ SOLN
INTRAMUSCULAR | Status: AC
  Filled 2017-03-13: qty 1

## 2017-03-13 MED ORDER — ONDANSETRON HCL 4 MG/2ML IJ SOLN
4.0000 mg | Freq: Four times a day (QID) | INTRAMUSCULAR | Status: DC | PRN
  Administered 2017-03-13: 4 mg via INTRAVENOUS
  Filled 2017-03-13: qty 2

## 2017-03-13 MED ORDER — ONDANSETRON HCL 4 MG/2ML IJ SOLN
INTRAMUSCULAR | Status: DC | PRN
  Administered 2017-03-13: 4 mg via INTRAVENOUS

## 2017-03-13 MED ORDER — FENTANYL CITRATE (PF) 250 MCG/5ML IJ SOLN
INTRAMUSCULAR | Status: AC
  Filled 2017-03-13: qty 5

## 2017-03-13 MED ORDER — THROMBIN 20000 UNITS EX SOLR
CUTANEOUS | Status: AC
  Filled 2017-03-13: qty 20000

## 2017-03-13 MED ORDER — PANTOPRAZOLE SODIUM 40 MG PO TBEC
40.0000 mg | DELAYED_RELEASE_TABLET | Freq: Every day | ORAL | Status: DC
  Administered 2017-03-13: 40 mg via ORAL
  Filled 2017-03-13: qty 1

## 2017-03-13 MED ORDER — ALBUMIN HUMAN 5 % IV SOLN
INTRAVENOUS | Status: DC | PRN
  Administered 2017-03-13 (×3): via INTRAVENOUS

## 2017-03-13 MED ORDER — SERTRALINE HCL 50 MG PO TABS: 50.0000 mg | ORAL_TABLET | Freq: Every day | ORAL | Status: DC

## 2017-03-13 MED ORDER — HYDROMORPHONE HCL 1 MG/ML IJ SOLN
INTRAMUSCULAR | Status: AC
  Filled 2017-03-13: qty 1

## 2017-03-13 MED ORDER — PROMETHAZINE HCL 25 MG/ML IJ SOLN: 6.2500 mg | INTRAMUSCULAR | Status: DC | PRN

## 2017-03-13 MED ORDER — METHOCARBAMOL 500 MG PO TABS
500.0000 mg | ORAL_TABLET | Freq: Four times a day (QID) | ORAL | Status: DC | PRN
  Administered 2017-03-13 – 2017-03-14 (×3): 500 mg via ORAL
  Filled 2017-03-13 (×3): qty 1

## 2017-03-13 MED ORDER — THROMBIN 5000 UNITS EX SOLR
CUTANEOUS | Status: DC | PRN
  Administered 2017-03-13 (×3): via TOPICAL

## 2017-03-13 MED ORDER — DEXTROSE 5 % IV SOLN
3.0000 g | INTRAVENOUS | Status: AC
  Administered 2017-03-13 (×2): 3 g via INTRAVENOUS
  Filled 2017-03-13: qty 3000

## 2017-03-13 MED ORDER — KETOROLAC TROMETHAMINE 15 MG/ML IJ SOLN
15.0000 mg | Freq: Four times a day (QID) | INTRAMUSCULAR | Status: DC
  Administered 2017-03-13 – 2017-03-14 (×3): 15 mg via INTRAVENOUS
  Filled 2017-03-13 (×3): qty 1

## 2017-03-13 MED ORDER — MIDAZOLAM HCL 2 MG/2ML IJ SOLN: 0.5000 mg | Freq: Once | INTRAMUSCULAR | Status: DC | PRN

## 2017-03-13 MED ORDER — DEXAMETHASONE SODIUM PHOSPHATE 10 MG/ML IJ SOLN
INTRAMUSCULAR | Status: DC | PRN
  Administered 2017-03-13: 10 mg via INTRAVENOUS

## 2017-03-13 MED ORDER — DEXTROSE 5 % IV SOLN
3.0000 g | Freq: Three times a day (TID) | INTRAVENOUS | Status: AC
  Administered 2017-03-13 – 2017-03-14 (×2): 3 g via INTRAVENOUS
  Filled 2017-03-13 (×2): qty 3000

## 2017-03-13 MED ORDER — SUGAMMADEX SODIUM 500 MG/5ML IV SOLN
INTRAVENOUS | Status: AC
  Filled 2017-03-13: qty 5

## 2017-03-13 MED ORDER — ACETAMINOPHEN 325 MG PO TABS
650.0000 mg | ORAL_TABLET | ORAL | Status: DC | PRN
  Administered 2017-03-14: 650 mg via ORAL
  Filled 2017-03-13: qty 2

## 2017-03-13 MED ORDER — ROCURONIUM BROMIDE 10 MG/ML (PF) SYRINGE
PREFILLED_SYRINGE | INTRAVENOUS | Status: AC
  Filled 2017-03-13: qty 5

## 2017-03-13 MED ORDER — THROMBIN 5000 UNITS EX SOLR
CUTANEOUS | Status: AC
  Filled 2017-03-13: qty 5000

## 2017-03-13 MED ORDER — FENTANYL CITRATE (PF) 250 MCG/5ML IJ SOLN
INTRAMUSCULAR | Status: AC
Start: 2017-03-13 — End: 2017-03-13
  Filled 2017-03-13: qty 5

## 2017-03-13 MED ORDER — MIDAZOLAM HCL 5 MG/5ML IJ SOLN
INTRAMUSCULAR | Status: DC | PRN
  Administered 2017-03-13: 2 mg via INTRAVENOUS

## 2017-03-13 MED ORDER — PANTOPRAZOLE SODIUM 40 MG IV SOLR: 40.0000 mg | Freq: Every day | INTRAVENOUS | Status: DC

## 2017-03-13 MED ORDER — VITAMIN B-12 ER 1000 MCG PO TBCR: EXTENDED_RELEASE_TABLET | Freq: Every day | ORAL | Status: DC

## 2017-03-13 MED ORDER — METHOCARBAMOL 1000 MG/10ML IJ SOLN
500.0000 mg | Freq: Four times a day (QID) | INTRAVENOUS | Status: DC | PRN
  Filled 2017-03-13: qty 5

## 2017-03-13 MED ORDER — OXYCODONE HCL 5 MG PO TABS
5.0000 mg | ORAL_TABLET | ORAL | Status: DC | PRN
  Administered 2017-03-13: 5 mg via ORAL
  Administered 2017-03-13 – 2017-03-14 (×4): 10 mg via ORAL
  Filled 2017-03-13 (×5): qty 2

## 2017-03-13 MED ORDER — SODIUM CHLORIDE 0.9 % IR SOLN
Status: DC | PRN
  Administered 2017-03-13: 11:00:00

## 2017-03-13 MED ORDER — BISACODYL 10 MG RE SUPP: 10.0000 mg | Freq: Every day | RECTAL | Status: DC | PRN

## 2017-03-13 MED ORDER — SODIUM CHLORIDE 0.9% FLUSH: 3.0000 mL | INTRAVENOUS | Status: DC | PRN

## 2017-03-13 MED ORDER — PHENYLEPHRINE 40 MCG/ML (10ML) SYRINGE FOR IV PUSH (FOR BLOOD PRESSURE SUPPORT)
PREFILLED_SYRINGE | INTRAVENOUS | Status: AC
  Filled 2017-03-13: qty 10

## 2017-03-13 MED ORDER — FENTANYL CITRATE (PF) 100 MCG/2ML IJ SOLN
INTRAMUSCULAR | Status: DC | PRN
  Administered 2017-03-13: 50 ug via INTRAVENOUS
  Administered 2017-03-13: 250 ug via INTRAVENOUS
  Administered 2017-03-13 (×4): 50 ug via INTRAVENOUS

## 2017-03-13 MED ORDER — ONDANSETRON HCL 4 MG/2ML IJ SOLN
INTRAMUSCULAR | Status: AC
  Filled 2017-03-13: qty 2

## 2017-03-13 MED ORDER — SODIUM CHLORIDE 0.9 % IV SOLN: 250.0000 mL | INTRAVENOUS | Status: DC

## 2017-03-13 MED ORDER — THROMBIN 20000 UNITS EX SOLR
CUTANEOUS | Status: DC | PRN
  Administered 2017-03-13: 11:00:00 via TOPICAL

## 2017-03-13 MED ORDER — LIDOCAINE 2% (20 MG/ML) 5 ML SYRINGE
INTRAMUSCULAR | Status: AC
  Filled 2017-03-13: qty 5

## 2017-03-13 MED ORDER — CHOLECALCIFEROL 10 MCG (400 UNIT) PO TABS
400.0000 [IU] | ORAL_TABLET | Freq: Every day | ORAL | Status: DC
  Filled 2017-03-13: qty 1

## 2017-03-13 MED ORDER — PHENYLEPHRINE HCL 10 MG/ML IJ SOLN
INTRAMUSCULAR | Status: DC | PRN
  Administered 2017-03-13 (×2): 120 ug via INTRAVENOUS
  Administered 2017-03-13 (×2): 80 ug via INTRAVENOUS

## 2017-03-13 MED ORDER — BUPIVACAINE HCL (PF) 0.5 % IJ SOLN
INTRAMUSCULAR | Status: AC
  Filled 2017-03-13: qty 30

## 2017-03-13 MED ORDER — SODIUM CHLORIDE 0.9% FLUSH: 3.0000 mL | Freq: Two times a day (BID) | INTRAVENOUS | Status: DC

## 2017-03-13 MED ORDER — ROCURONIUM BROMIDE 100 MG/10ML IV SOLN
INTRAVENOUS | Status: DC | PRN
  Administered 2017-03-13: 20 mg via INTRAVENOUS
  Administered 2017-03-13: 60 mg via INTRAVENOUS
  Administered 2017-03-13 (×2): 20 mg via INTRAVENOUS
  Administered 2017-03-13 (×2): 10 mg via INTRAVENOUS

## 2017-03-13 MED ORDER — MEPERIDINE HCL 25 MG/ML IJ SOLN: 6.2500 mg | INTRAMUSCULAR | Status: DC | PRN

## 2017-03-13 MED ORDER — CHLORHEXIDINE GLUCONATE CLOTH 2 % EX PADS: 6.0000 | MEDICATED_PAD | Freq: Once | CUTANEOUS | Status: DC

## 2017-03-13 MED ORDER — LACTATED RINGERS IV SOLN
INTRAVENOUS | Status: DC
  Administered 2017-03-13 (×4): via INTRAVENOUS

## 2017-03-13 MED ORDER — OXYCODONE HCL 5 MG PO TABS
ORAL_TABLET | ORAL | Status: AC
  Filled 2017-03-13: qty 1

## 2017-03-13 MED ORDER — PHENYLEPHRINE HCL 10 MG/ML IJ SOLN
INTRAVENOUS | Status: DC | PRN
  Administered 2017-03-13: 30 ug/min via INTRAVENOUS
  Administered 2017-03-13: 14:00:00 via INTRAVENOUS

## 2017-03-13 MED ORDER — GLYCOPYRROLATE 0.2 MG/ML IJ SOLN
INTRAMUSCULAR | Status: DC | PRN
  Administered 2017-03-13: 0.2 mg via INTRAVENOUS

## 2017-03-13 MED ORDER — HYDROMORPHONE HCL 1 MG/ML IJ SOLN
0.5000 mg | INTRAMUSCULAR | Status: DC | PRN
  Administered 2017-03-13: 0.5 mg via INTRAVENOUS
  Filled 2017-03-13: qty 0.5

## 2017-03-13 MED ORDER — PROPOFOL 10 MG/ML IV BOLUS
INTRAVENOUS | Status: AC
  Filled 2017-03-13: qty 40

## 2017-03-13 MED ORDER — MIDAZOLAM HCL 2 MG/2ML IJ SOLN
INTRAMUSCULAR | Status: AC
  Filled 2017-03-13: qty 2

## 2017-03-13 MED ORDER — DEXTROSE 5 % IV SOLN
3.0000 g | INTRAVENOUS | Status: DC
  Filled 2017-03-13: qty 3000

## 2017-03-13 MED ORDER — MUPIROCIN 2 % EX OINT
1.0000 "application " | TOPICAL_OINTMENT | Freq: Two times a day (BID) | CUTANEOUS | Status: DC
  Administered 2017-03-13: 1 via NASAL
  Filled 2017-03-13: qty 22

## 2017-03-13 MED ORDER — ACETAMINOPHEN 650 MG RE SUPP: 650.0000 mg | RECTAL | Status: DC | PRN

## 2017-03-13 MED ORDER — HYDROMORPHONE HCL 1 MG/ML IJ SOLN
INTRAMUSCULAR | Status: AC
Start: 2017-03-13 — End: 2017-03-13
  Filled 2017-03-13: qty 0.5

## 2017-03-13 MED ORDER — 0.9 % SODIUM CHLORIDE (POUR BTL) OPTIME
TOPICAL | Status: DC | PRN
  Administered 2017-03-13: 1000 mL

## 2017-03-13 MED ORDER — PHENOL 1.4 % MT LIQD: 1.0000 | OROMUCOSAL | Status: DC | PRN

## 2017-03-13 MED ORDER — HYDROMORPHONE HCL 1 MG/ML IJ SOLN
0.2500 mg | INTRAMUSCULAR | Status: DC | PRN
  Administered 2017-03-13 (×4): 0.5 mg via INTRAVENOUS

## 2017-03-13 MED ORDER — SENNA 8.6 MG PO TABS
1.0000 | ORAL_TABLET | Freq: Two times a day (BID) | ORAL | Status: DC
  Administered 2017-03-13: 8.6 mg via ORAL
  Filled 2017-03-13: qty 1

## 2017-03-13 MED ORDER — LIDOCAINE-EPINEPHRINE 1 %-1:100000 IJ SOLN
INTRAMUSCULAR | Status: AC
  Filled 2017-03-13: qty 1

## 2017-03-13 MED ORDER — ONDANSETRON HCL 4 MG PO TABS: 4.0000 mg | ORAL_TABLET | Freq: Four times a day (QID) | ORAL | Status: DC | PRN

## 2017-03-13 MED ORDER — SUGAMMADEX SODIUM 500 MG/5ML IV SOLN
INTRAVENOUS | Status: DC | PRN
  Administered 2017-03-13: 500 mg via INTRAVENOUS

## 2017-03-13 MED ORDER — DOCUSATE SODIUM 100 MG PO CAPS
100.0000 mg | ORAL_CAPSULE | Freq: Two times a day (BID) | ORAL | Status: DC
  Administered 2017-03-13 – 2017-03-14 (×2): 100 mg via ORAL
  Filled 2017-03-13 (×2): qty 1

## 2017-03-13 MED ORDER — PROPOFOL 10 MG/ML IV BOLUS
INTRAVENOUS | Status: DC | PRN
  Administered 2017-03-13: 200 mg via INTRAVENOUS

## 2017-03-13 MED ORDER — MENTHOL 3 MG MT LOZG: 1.0000 | LOZENGE | OROMUCOSAL | Status: DC | PRN

## 2017-03-13 SURGICAL SUPPLY — 77 items
BAG DECANTER FOR FLEXI CONT (MISCELLANEOUS) ×2 IMPLANT
BASKET BONE COLLECTION (BASKET) ×2 IMPLANT
BENZOIN TINCTURE PRP APPL 2/3 (GAUZE/BANDAGES/DRESSINGS) ×2 IMPLANT
BIT DRILL 3.5 POWEREASE (BIT) ×2 IMPLANT
BLADE CLIPPER SURG (BLADE) IMPLANT
BLADE SURG 11 STRL SS (BLADE) ×2 IMPLANT
BUR MATCHSTICK NEURO 3.0 LAGG (BURR) ×2 IMPLANT
BUR PRECISION FLUTE 5.0 (BURR) ×2 IMPLANT
CANISTER SUCT 3000ML PPV (MISCELLANEOUS) ×2 IMPLANT
CARTRIDGE OIL MAESTRO DRILL (MISCELLANEOUS) ×1 IMPLANT
CONT SPEC 4OZ CLIKSEAL STRL BL (MISCELLANEOUS) ×2 IMPLANT
COVER BACK TABLE 60X90IN (DRAPES) ×2 IMPLANT
DECANTER SPIKE VIAL GLASS SM (MISCELLANEOUS) ×2 IMPLANT
DERMABOND ADVANCED (GAUZE/BANDAGES/DRESSINGS) ×1
DERMABOND ADVANCED .7 DNX12 (GAUZE/BANDAGES/DRESSINGS) ×1 IMPLANT
DEVICE INTERBODY ELEVATE 23X10 (Cage) ×4 IMPLANT
DIFFUSER DRILL AIR PNEUMATIC (MISCELLANEOUS) ×2 IMPLANT
DRAPE C-ARM 42X72 X-RAY (DRAPES) ×2 IMPLANT
DRAPE C-ARMOR (DRAPES) ×2 IMPLANT
DRAPE LAPAROTOMY 100X72X124 (DRAPES) ×2 IMPLANT
DRAPE POUCH INSTRU U-SHP 10X18 (DRAPES) ×2 IMPLANT
DRAPE SURG 17X23 STRL (DRAPES) ×2 IMPLANT
DRSG OPSITE POSTOP 4X8 (GAUZE/BANDAGES/DRESSINGS) ×2 IMPLANT
DURAPREP 26ML APPLICATOR (WOUND CARE) ×2 IMPLANT
ELECT BLADE 4.0 EZ CLEAN MEGAD (MISCELLANEOUS) ×2
ELECT REM PT RETURN 9FT ADLT (ELECTROSURGICAL) ×2
ELECTRODE BLDE 4.0 EZ CLN MEGD (MISCELLANEOUS) ×1 IMPLANT
ELECTRODE REM PT RTRN 9FT ADLT (ELECTROSURGICAL) ×1 IMPLANT
GAUZE SPONGE 4X4 12PLY STRL (GAUZE/BANDAGES/DRESSINGS) IMPLANT
GAUZE SPONGE 4X4 16PLY XRAY LF (GAUZE/BANDAGES/DRESSINGS) ×2 IMPLANT
GLOVE BIO SURGEON STRL SZ7 (GLOVE) IMPLANT
GLOVE BIOGEL PI IND STRL 7.0 (GLOVE) IMPLANT
GLOVE BIOGEL PI IND STRL 7.5 (GLOVE) ×2 IMPLANT
GLOVE BIOGEL PI INDICATOR 7.0 (GLOVE)
GLOVE BIOGEL PI INDICATOR 7.5 (GLOVE) ×2
GLOVE ECLIPSE 7.0 STRL STRAW (GLOVE) ×2 IMPLANT
GLOVE ECLIPSE 7.5 STRL STRAW (GLOVE) ×2 IMPLANT
GLOVE EXAM NITRILE LRG STRL (GLOVE) IMPLANT
GLOVE EXAM NITRILE XL STR (GLOVE) IMPLANT
GLOVE EXAM NITRILE XS STR PU (GLOVE) IMPLANT
GOWN STRL REUS W/ TWL LRG LVL3 (GOWN DISPOSABLE) ×2 IMPLANT
GOWN STRL REUS W/ TWL XL LVL3 (GOWN DISPOSABLE) ×1 IMPLANT
GOWN STRL REUS W/TWL 2XL LVL3 (GOWN DISPOSABLE) IMPLANT
GOWN STRL REUS W/TWL LRG LVL3 (GOWN DISPOSABLE) ×2
GOWN STRL REUS W/TWL XL LVL3 (GOWN DISPOSABLE) ×1
HEMOSTAT POWDER KIT SURGIFOAM (HEMOSTASIS) ×6 IMPLANT
KIT BASIN OR (CUSTOM PROCEDURE TRAY) ×2 IMPLANT
KIT INFUSE SMALL (Orthopedic Implant) ×2 IMPLANT
KIT POSITION SURG JACKSON T1 (MISCELLANEOUS) ×2 IMPLANT
KIT ROOM TURNOVER OR (KITS) ×2 IMPLANT
MILL MEDIUM DISP (BLADE) ×2 IMPLANT
NEEDLE HYPO 18GX1.5 BLUNT FILL (NEEDLE) IMPLANT
NEEDLE HYPO 22GX1.5 SAFETY (NEEDLE) ×2 IMPLANT
NEEDLE SPNL 18GX3.5 QUINCKE PK (NEEDLE) IMPLANT
NS IRRIG 1000ML POUR BTL (IV SOLUTION) ×2 IMPLANT
OIL CARTRIDGE MAESTRO DRILL (MISCELLANEOUS) ×2
PACK LAMINECTOMY NEURO (CUSTOM PROCEDURE TRAY) ×2 IMPLANT
PAD ARMBOARD 7.5X6 YLW CONV (MISCELLANEOUS) ×6 IMPLANT
PATTIES SURGICAL 1X1 (DISPOSABLE) ×2 IMPLANT
ROD SPINAL 5.5MMX45MM COBALT (Rod) ×4 IMPLANT
SCREW SET SOLERA (Screw) ×4 IMPLANT
SCREW SET SOLERA TI5.5 (Screw) ×4 IMPLANT
SCREW SOLERA 45X6.5XMA NS SPNE (Screw) ×2 IMPLANT
SCREW SOLERA 6.5X45MM (Screw) ×2 IMPLANT
SCREW SOLERA 7.5X45 (Screw) ×4 IMPLANT
SPONGE LAP 4X18 X RAY DECT (DISPOSABLE) IMPLANT
SPONGE SURGIFOAM ABS GEL 100 (HEMOSTASIS) ×2 IMPLANT
STRIP CLOSURE SKIN 1/2X4 (GAUZE/BANDAGES/DRESSINGS) IMPLANT
SUT VIC AB 0 CT1 18XCR BRD8 (SUTURE) ×2 IMPLANT
SUT VIC AB 0 CT1 8-18 (SUTURE) ×2
SUT VICRYL 3-0 RB1 18 ABS (SUTURE) ×2 IMPLANT
SYR 3ML LL SCALE MARK (SYRINGE) IMPLANT
TOWEL GREEN STERILE (TOWEL DISPOSABLE) ×2 IMPLANT
TOWEL GREEN STERILE FF (TOWEL DISPOSABLE) ×2 IMPLANT
TRAY FOLEY W/METER SILVER 16FR (SET/KITS/TRAYS/PACK) ×2 IMPLANT
TUBE CONNECTING 12X1/4 (SUCTIONS) ×2 IMPLANT
WATER STERILE IRR 1000ML POUR (IV SOLUTION) ×2 IMPLANT

## 2017-03-13 NOTE — Brief Op Note (Signed)
03/13/2017  7:49 PM  PATIENT:  Sean Murillo  41 y.o. male  PRE-OPERATIVE DIAGNOSIS:  SPONDYLOLISTHESIS, LUMBAR REGION  POST-OPERATIVE DIAGNOSIS:  spondylolisthesis lumbar region  PROCEDURE:  Procedure(s) with comments: POSTERIOR LUMBAR INTERBODY FUSION LUMBAR 5- SACRAL1 (N/A) - POSTERIOR LUMBAR INTERBODY FUSION LUMBAR 5- SACRAL1  SURGEON:  Surgeon(s) and Role:    * Lisbeth Renshaw, MD - Primary    * Julio Sicks, MD - Assisting  PHYSICIAN ASSISTANT: None  ASSISTANTS: Dr. Temple Pacini, MD   ANESTHESIA:   general  EBL:  No intake/output data recorded.  BLOOD ADMINISTERED:none  DRAINS: none   LOCAL MEDICATIONS USED:  MARCAINE    and XYLOCAINE   SPECIMEN:  No Specimen  DISPOSITION OF SPECIMEN:  N/A  COUNTS:  YES  TOURNIQUET:  * No tourniquets in log *  DICTATION: .Note written in EPIC  PLAN OF CARE: Admit to inpatient   PATIENT DISPOSITION:  PACU - hemodynamically stable.   Delay start of Pharmacological VTE agent (>24hrs) due to surgical blood loss or risk of bleeding: yes

## 2017-03-13 NOTE — Anesthesia Postprocedure Evaluation (Signed)
Anesthesia Post Note  Patient: Sean Murillo  Procedure(s) Performed: Procedure(s) (LRB): POSTERIOR LUMBAR INTERBODY FUSION LUMBAR 5- SACRAL1 (N/A)     Patient location during evaluation: PACU Anesthesia Type: General Level of consciousness: awake and alert, patient cooperative and oriented Pain management: pain level controlled Vital Signs Assessment: post-procedure vital signs reviewed and stable Respiratory status: spontaneous breathing, nonlabored ventilation, respiratory function stable and patient connected to nasal cannula oxygen Cardiovascular status: blood pressure returned to baseline and stable Postop Assessment: no apparent nausea or vomiting Anesthetic complications: no    Last Vitals:  Vitals:   03/13/17 1614 03/13/17 1615  BP: 115/73   Pulse: 96 85  Resp: 14 15  Temp:    SpO2: 92% 93%    Last Pain:  Vitals:   03/13/17 1600  TempSrc:   PainSc: Asleep                 Sean Murillo,E. Una Yeomans

## 2017-03-13 NOTE — H&P (Signed)
Chief Complaint  Back pain  History of Present Illness  Mr. Sean Murillo is a 41 year old man I am seeing primarily for back pain. We have previously been managing his low back pain conservatively. He initially comes in with a primary complaint of right-sided low back pain with radiation down the back of the right leg to about the knee level. He says symptoms started about 1.5 year ago without any identifiable inciting event. Initially, pain was primarily in the low back, however over the last few months he has noted radiation down the right thigh. He does not report any left leg symptoms. He says the pain is worse when he is standing and walking. It resolves when he is sitting or lying down. He has not noted any changes in bowel or bladder function.  He has been through a course of physical therapy which has not really provided much relief of his primarily low back pain. We did also refer him for facet joint injections at L5-S1 bilaterally. He says that these injections provided minimal relief also. He is here today to discuss further options.   Past Medical History   Past Medical History:  Diagnosis Date  . Arthritis    lumbar spine & above- per pt.   . Depression    PTSD  . History of kidney stones    passed spontaneously x1  . PTSD (post-traumatic stress disorder)   . [facility] spotted fever   . Sleep apnea    severe- on Cpap- q night   . Spondylolisthesis of lumbar region     Past Surgical History   Past Surgical History:  Procedure Laterality Date  . cyst removed from right shoulder    . HERNIA REPAIR  2014, 2007   [facility] x2, also in 2001- inguinal - repair- R side   . MANDIBLE SURGERY    . SPLENECTOMY, TOTAL      Social History   Social History  Substance Use Topics  . Smoking status: Never Smoker  . Smokeless tobacco: Current User    Types: Snuff     Comment: working on it  . Alcohol use No     Comment: rare    Medications   Prior to Admission medications    Medication Sig Start Date End Date Taking? Authorizing Provider  acetaminophen (TYLENOL) 650 MG CR tablet Take 650 mg by mouth every 8 (eight) hours as needed for pain.   Yes [provider]  Cholecalciferol (VITAMIN D PO) Take 1 capsule by mouth daily.   Yes [provider]  Cyanocobalamin (VITAMIN B-12 CR PO) Take 1 tablet by mouth daily.   Yes [provider]  sertraline (ZOLOFT) 50 MG tablet Take 50 mg by mouth daily.   Yes [provider]  ondansetron (ZOFRAN) 4 MG tablet Take 1 tablet (4 mg total) by mouth every 8 (eight) hours as needed for nausea or vomiting. Patient not taking: Reported on 03/06/2017 03/04/17   Mesner, Barbara Cower, MD    Allergies   Allergies  Allergen Reactions  . Contrast Media [Iodinated Diagnostic Agents] Hives  . Iohexol Hives    Needs pre meds    Review of Systems  ROS  Neurologic Exam  Awake, alert, oriented Memory and concentration grossly intact Speech fluent, appropriate CN grossly intact Motor exam: Upper Extremities Deltoid Bicep Tricep Grip  Right 5/5 5/5 5/5 5/5  Left 5/5 5/5 5/5 5/5   Lower Extremities IP Quad PF DF EHL  Right 5/5 5/5 5/5 5/5 5/5  Left 5/5 5/5 5/5 5/5 5/5   Sensation grossly intact to LT  Imaging  Dynamic lumbar spine x-rays done in the office today were reviewed. These demonstrate mobile spondylolisthesis, grade 1, at L5-S1.   Impression  41 year old man with primarily low back pain related to mobile spondylolisthesis at L5-S1. He has failed reasonable conservative management including medical therapy, physical therapy, and injection therapy.  MRI demonstrates bilateral pars defects with facet arthrosis and moderate foraminal stenosis bilaterally   Plan  we will plan on proceeding with lumbar decompression and stabilization/fusion at L5-S1   In the office, I reviewed with the patient treatment options at this point including continued conservative treatment versus surgical  decompression and fusion. I explained to the patient the details of the procedure, as well as the risks which include but are not limited to nerve root injury leading to leg or foot weakness/numbness and/or bowel and bladder dysfunction, CSF leak, bleeding, and infection. Possible outcomes of surgery were also discussed including the possibility of persistence or worsening of pain symptoms and the possibility of accelerated adjacent level degeneration. The general risks of anesthesia were also reviewed including heart attack, stroke, and DVT/PE.   The patient understood our discussion and is willing to proceed with surgical decompression and fusion. All questions were answered. Consent was obtained

## 2017-03-13 NOTE — Anesthesia Procedure Notes (Signed)
Procedure Name: Intubation Date/Time: 03/13/2017 9:45 AM Performed by: Jenne Campus Pre-anesthesia Checklist: Patient identified, Emergency Drugs available, Suction available and Patient being monitored Patient Re-evaluated:Patient Re-evaluated prior to induction Oxygen Delivery Method: Circle System Utilized Preoxygenation: Pre-oxygenation with 100% oxygen Induction Type: IV induction Ventilation: Mask ventilation without difficulty, Oral airway inserted - appropriate to patient size and Two handed mask ventilation required Grade View: Grade I Tube type: Oral Tube size: 8.0 mm Number of attempts: 1 Airway Equipment and Method: Stylet,  Oral airway and Video-laryngoscopy Placement Confirmation: ETT inserted through vocal cords under direct vision,  positive ETCO2 and breath sounds checked- equal and bilateral Secured at: 23 cm Tube secured with: Tape Dental Injury: Teeth and Oropharynx as per pre-operative assessment  Comments: Smooth IV induction. DL x 1 MAC 4. Large amount of redundant tissue. Unable to locate cords. DLx1 with video-glide 4. Grade 1 view. Atraumatic intubation. BBSE +ETCO2.

## 2017-03-13 NOTE — Transfer of Care (Signed)
Immediate Anesthesia Transfer of Care Note  Patient: Sean Murillo  Procedure(s) Performed: Procedure(s) with comments: POSTERIOR LUMBAR INTERBODY FUSION LUMBAR 5- SACRAL1 (N/A) - POSTERIOR LUMBAR INTERBODY FUSION LUMBAR 5- SACRAL1  Patient Location: PACU  Anesthesia Type:General  Level of Consciousness: awake, alert  and oriented  Airway & Oxygen Therapy: Patient Spontanous Breathing and Patient connected to face mask oxygen  Post-op Assessment: Report given to RN and Post -op Vital signs reviewed and stable  Post vital signs: Reviewed and stable  Last Vitals:  Vitals:   03/13/17 0606  BP: (!) 145/96  Pulse: 78  Resp: 18  Temp: 36.9 C  SpO2: 96%    Last Pain:  Vitals:   03/13/17 0708  TempSrc:   PainSc: 3       Patients Stated Pain Goal: 2 (03/13/17 0708)  Complications: No apparent anesthesia complications

## 2017-03-14 LAB — BASIC METABOLIC PANEL
Anion gap: 6 (ref 5–15)
BUN: 9 mg/dL (ref 6–20)
CHLORIDE: 102 mmol/L (ref 101–111)
CO2: 27 mmol/L (ref 22–32)
CREATININE: 1.01 mg/dL (ref 0.61–1.24)
Calcium: 8.6 mg/dL — ABNORMAL LOW (ref 8.9–10.3)
GFR calc Af Amer: >60 mL/min (ref >60)
GFR calc non Af Amer: >60 mL/min (ref >60)
GLUCOSE: 143 mg/dL — AB (ref 65–99)
Potassium: 4.3 mmol/L (ref 3.5–5.1)
SODIUM: 135 mmol/L (ref 135–145)

## 2017-03-14 LAB — CBC
HCT: 34.2 % — ABNORMAL LOW (ref 39.0–52.0)
Hemoglobin: 11.7 g/dL — ABNORMAL LOW (ref 13.0–17.0)
MCH: 28.1 pg (ref 26.0–34.0)
MCHC: 34.2 g/dL (ref 30.0–36.0)
MCV: 82.2 fL (ref 78.0–100.0)
PLATELETS: 319 10*3/uL (ref 150–400)
RBC: 4.16 MIL/uL — ABNORMAL LOW (ref 4.22–5.81)
RDW: 14.3 % (ref 11.5–15.5)
WBC: 20.6 10*3/uL — ABNORMAL HIGH (ref 4.0–10.5)

## 2017-03-14 MED ORDER — METHOCARBAMOL 500 MG PO TABS: 500.0000 mg | ORAL_TABLET | Freq: Four times a day (QID) | ORAL | 3 refills | Status: DC | PRN

## 2017-03-14 MED ORDER — OXYCODONE-ACETAMINOPHEN 5-325 MG PO TABS: 1.0000 | ORAL_TABLET | ORAL | 0 refills | Status: DC | PRN

## 2017-03-14 NOTE — Progress Notes (Signed)
Patient alert and oriented, mae's well, voiding adequate amount of urine, swallowing without difficulty,  c/o mild pain at time of discharge. Patient discharged home with family. Script and discharged instructions given to patient. Patient and family stated understanding of instructions given. Patient has an appointment with Dr. Nundkumar.    

## 2017-03-14 NOTE — Evaluation (Signed)
Physical Therapy Evaluation and Discharge Patient Details Name: Sean Murillo MRN: [id number] DOB: 02/12/1976 Today's Date: 03/14/2017   History of Present Illness  Pt is a 41 y.o. male s/p L5-S1 PLIF. PMHx: Arthritis, Depression, PTSD, Sleep apnea.  Clinical Impression  Patient evaluated by Physical Therapy with no further acute PT needs identified. All education has been completed and the patient has no further questions. At the time of PT eval pt was able to perform transfers and ambulation with gross supervision to min guard assist for balance support and safety. Pt was educated on precautions, walking program, activity progression, car transfer, and brace application/wearing schedule. See below for any follow-up Physical Therapy or equipment needs. PT is signing off. Thank you for this referral.     Follow Up Recommendations  No PT follow-up; Intermittent supervision    Equipment Recommendations   Bariatric Rolling Walker   Recommendations for Other Services       Precautions / Restrictions Precautions Precautions: Fall;Back Precaution Booklet Issued: No Precaution Comments: Educated pt and wife on back precautions. He was able to recall 3/3 without cues. Required Braces or Orthoses: Spinal Brace Spinal Brace: Lumbar corset;Applied in sitting position Restrictions Weight Bearing Restrictions: No      Mobility  Bed Mobility Overal bed mobility: Needs Assistance Bed Mobility: Rolling;Sidelying to Sit Rolling: Supervision Sidelying to sit: Supervision       General bed mobility comments: Pt received in hall with OT  Transfers Overall transfer level: Needs assistance Equipment used: Rolling walker (2 wheeled) Transfers: Sit to/from Stand Sit to Stand: Min guard         General transfer comment: VC's for hand placement on seated surface for safety. Close guard for safety however pt did not demonstrate any LOB.   Ambulation/Gait Ambulation/Gait assistance:  Supervision Ambulation Distance (Feet): 400 Feet Assistive device: Rolling walker (2 wheeled) Gait Pattern/deviations: Step-through pattern;Decreased stride length;Trunk flexed Gait velocity: Decreased Gait velocity interpretation: Below normal speed for age/gender General Gait Details: VC's for upright posture and increased proximity of walker. No unsteadiness or LOB noted.   Stairs Stairs: Yes Stairs assistance: Min guard Stair Management: Two rails;Step to pattern;Forwards;Sideways Number of Stairs: 4 General stair comments: Pt ascended stairs forward facing with step-to pattern. He descended stairs sideways with step-to pattern as well. VC's for general safety and sequencing required.   Wheelchair Mobility    Modified Rankin (Stroke Patients Only)       Balance Overall balance assessment: Needs assistance Sitting-balance support: Feet supported;No upper extremity supported Sitting balance-Leahy Scale: Good     Standing balance support: No upper extremity supported;During functional activity Standing balance-Leahy Scale: Fair Standing balance comment: Pt was able to stand unsupported in preparation of stand>sit transfer                             Pertinent Vitals/Pain Pain Assessment: Faces Faces Pain Scale: Hurts a little bit Pain Location: back Pain Descriptors / Indicators: Sore Pain Intervention(s): Monitored during session    Home Living Family/patient expects to be discharged to:: Private residence Living Arrangements: Spouse/significant other Available Help at Discharge: Family;Available 24 hours/day Type of Home: House Home Access: Stairs to enter Entrance Stairs-Rails: Doctor, general practice of Steps: 4 Home Layout: One level Home Equipment: Shower seat      Prior Function Level of Independence: Independent               Hand Dominance   Dominant Hand: Right  Extremity/Trunk Assessment   Upper Extremity  Assessment Upper Extremity Assessment: Overall WFL for tasks assessed    Lower Extremity Assessment Lower Extremity Assessment: Generalized weakness (Consistent with pre-op diagnosis)    Cervical / Trunk Assessment Cervical / Trunk Assessment: Other exceptions Cervical / Trunk Exceptions: s/p spinal sx  Communication   Communication: No difficulties  Cognition Arousal/Alertness: Awake/alert Behavior During Therapy: WFL for tasks assessed/performed Overall Cognitive Status: Within Functional Limits for tasks assessed                                        General Comments      Exercises     Assessment/Plan    PT Assessment Patent does not need any further PT services  PT Problem List Decreased strength;Decreased range of motion;Decreased activity tolerance;Decreased balance;Decreased mobility;Decreased knowledge of use of DME;Decreased safety awareness;Decreased knowledge of precautions;Pain;Obesity       PT Treatment Interventions      PT Goals (Current goals can be found in the Care Plan section)  Acute Rehab PT Goals Patient Stated Goal: return home PT Goal Formulation: All assessment and education complete, DC therapy    Frequency     Barriers to discharge        Co-evaluation               AM-PAC PT "6 Clicks" Daily Activity  Outcome Measure Difficulty turning over in bed (including adjusting bedclothes, sheets and blankets)?: None Difficulty moving from lying on back to sitting on the side of the bed? : None Difficulty sitting down on and standing up from a chair with arms (e.g., wheelchair, bedside commode, etc,.)?: A Little Help needed moving to and from a bed to chair (including a wheelchair)?: A Little Help needed walking in hospital room?: A Little Help needed climbing 3-5 steps with a railing? : A Little 6 Click Score: 20    End of Session Equipment Utilized During Treatment: Back brace Activity Tolerance: Patient tolerated  treatment well Patient left: with call bell/phone within reach;with family/visitor present (Sitting EOB to eat breakfast) Nurse Communication: Mobility status PT Visit Diagnosis: Other abnormalities of gait and mobility (R26.89);Other symptoms and signs involving the nervous system (R29.898)    Time: 2956-2130 PT Time Calculation (min) (ACUTE ONLY): 21 min   Charges:   PT Evaluation $PT Eval Low Complexity: 1 Low     PT G Codes:        Conni Slipper, PT, DPT Acute Rehabilitation Services Pager: [phone]   Marylynn Pearson 03/14/2017, 9:01 AM

## 2017-03-14 NOTE — Discharge Instructions (Addendum)
Wound Care Leave incision open to air. You may shower. Do not scrub directly on incision.  Do not put any creams, lotions, or ointments on incision. Activity Walk each and every day, increasing distance each day. No lifting greater than 5 lbs.  Avoid bending, arching, and twisting. No driving for 2 weeks; may ride as a passenger locally. If provided with back brace, wear when out of bed.  It is not necessary to wear in bed. Diet Resume your normal diet.  Return to Work Will be discussed at you follow up appointment. Call Your Doctor If Any of These Occur Redness, drainage, or swelling at the wound.  Temperature greater than 101 degrees. Severe pain not relieved by pain medication. Incision starts to come apart. Follow Up Appt Call today for appointment in 3-4 weeks ([phone]) or for problems.  If you have any hardware placed in your spine, you will need an x-ray before your appointment.   Spinal Fusion, Adult, Care After This sheet gives you information about how to care for yourself after your procedure. Your health care provider may also give you more specific instructions. If you have problems or questions, contact your health care provider. What can I expect after the procedure? After the procedure, it is common to have:  Back pain and stiffness.  Pain in the incision area.  Follow these instructions at home: Medicines  Take over-the-counter and prescription medicines only as told by your health care provider. These include any pain medicines or blood thinning medicines (anticoagulants).  If you were prescribed an antibiotic medicine, take it as told by your health care provider. Do not stop taking the antibiotic even if you start to feel better.  Do not drive for 24 hours if you received a medicine to help you relax (sedative).  Do not drive or use heavy machinery while taking prescription pain medicine. If you have a brace:  Wear the brace as told by your health care  provider. Remove it only as told by your health care provider.  Keep the brace clean. Managing pain, stiffness, and swelling  If directed, put ice on the injured area: ? If you have a removable brace, remove it as told by your health care provider. ? Put ice in a plastic bag. ? Place a towel between your skin and the bag. ? Leave the ice on for 20 minutes, 2-3 times a day. Incision care  Follow instructions from your health care provider about how to take care of your incision. Make sure you: ? Wash your hands with soap and water before you change your bandage (dressing). If soap and water are not available, use hand sanitizer. ? Change your dressing as told by your health care provider. ? Leave stitches (sutures), skin glue, or adhesive strips in place. These skin closures may need to be in place for 2 weeks or longer. If adhesive strip edges start to loosen and curl up, you may trim the loose edges. Do not remove adhesive strips completely unless your health care provider tells you to do that.  Keep your incision clean and dry. Do not take baths, swim, or use a hot tub until your health care provider approves.  Check your incision area every day for signs of infection. Check for: ? More redness, swelling, or pain. ? Fluid or blood. ? Warmth. ? Pus or a bad smell. Physical activity  Rest and protect your back as much as possible.  Follow instructions from your health care provider about  how to move and use good posture to help your spine heal.  Do not lift anything that is heavier than 8 lb (3.6 kg) or as told by your health care provider.  Do not twist or bend at the waist until your health care provider approves.  Avoid: ? Pushing and pulling motions. ? Lifting anything over your head. ? Sitting or lying down in the same position for long periods of time.  Do not exercise until your health care provider approves. Once your health care provider has approved exercise, ask him  or her what kinds of exercises you can do to make your back stronger (physical therapy). General instructions  Wear compression stockings and walk at least every few hours as told by your health care provider. Doing this will help to prevent blood clots and reduce swelling in your legs.  Do not use any products that contain nicotine or tobacco, such as cigarettes and e-cigarettes. These can delay bone healing. If you need help quitting, ask your health care provider.  To prevent or treat constipation while you are taking prescription pain medicine, your health care provider may recommend that you: ? Drink enough fluid to keep your urine clear or pale yellow. ? Take over-the-counter or prescription medicines. ? Eat foods that are high in fiber, such as fresh fruits and vegetables, whole grains, and beans. ? Limit foods that are high in fat and processed sugars, such as fried and sweet foods.  Keep all follow-up visits as told by your health care provider. This is important. Contact a health care provider if:  You have pain that gets worse or does not get better with medicine.  Your legs or feet become painful or swollen.  You have more redness, swelling, or pain around your incision.  You have fluid or blood coming from your incision.  Your incision feels warm to the touch.  You have pus or a bad smell coming from your incision.  You have a fever.  You vomit or feel nausea.  You have weakness or numbness in your legs that is new or getting worse.  You have trouble controlling urination or bowel movements. Get help right away if:  You have severe pain.  You have chest pain.  You have trouble breathing.  You develop a cough. These symptoms may represent a serious problem that is an emergency. Do not wait to see if the symptoms will go away. Get medical help right away. Call your local emergency services (911 in the [place] Do not drive yourself to the hospital. Summary  It  is common to have pain at the back and incision area.  Icing and pain medicines may help to control the pain. Follow directions from your health care provider.  Rest and protect your back as much as possible. Do not twist or bend at the waist. Get up and walk at least every few hours as told by your health care provider. This information is not intended to replace advice given to you by your health care provider. Make sure you discuss any questions you have with your health care provider.

## 2017-03-14 NOTE — Evaluation (Signed)
Occupational Therapy Evaluation Patient Details Name: Sean Murillo MRN: [id number] DOB: 21-Aug-1975 Today's Date: 03/14/2017    History of Present Illness Pt is a 41 y.o. male s/p L5-S1 PLIF. PMHx: Arthritis, Depression, PTSD, Sleep apnea.   Clinical Impression   Pt reports he was independent with ADL PTA. Currently pt overall min guard assist for functional mobility and ADL with the exception of mod assist for LB ADL. All back, safety, and ADL education completed with pt and wife. Pt planning to d/c home with 24/7 supervision from family. No further acute OT needs identified; signing off at this time. Please re-consult if needs change. Thank you for this referral.    Follow Up Recommendations  No OT follow up;Supervision/Assistance - 24 hour (initially)    Equipment Recommendations  None recommended by OT    Recommendations for Other Services PT consult     Precautions / Restrictions Precautions Precautions: Fall;Back Precaution Booklet Issued: No Precaution Comments: Educated pt and wife on back precautions Required Braces or Orthoses: Spinal Brace Spinal Brace: Lumbar corset;Applied in sitting position Restrictions Weight Bearing Restrictions: No      Mobility Bed Mobility Overal bed mobility: Needs Assistance Bed Mobility: Rolling;Sidelying to Sit Rolling: Supervision Sidelying to sit: Supervision       General bed mobility comments: HOB flat with use of bed rail. Cues for technique  Transfers Overall transfer level: Needs assistance Equipment used: Rolling walker (2 wheeled) Transfers: Sit to/from Stand Sit to Stand: Min guard         General transfer comment: Cues for hand placement and technique. Min guard for safety    Balance Overall balance assessment: Needs assistance Sitting-balance support: Feet supported;No upper extremity supported Sitting balance-Leahy Scale: Good     Standing balance support: Single extremity supported;During functional  activity Standing balance-Leahy Scale: Fair                             ADL either performed or assessed with clinical judgement   ADL Overall ADL's : Needs assistance/impaired Eating/Feeding: Set up;Sitting   Grooming: Min guard;Standing Grooming Details (indicate cue type and reason): Educated on use of 2 cups for oral care Upper Body Bathing: Set up;Supervision/ safety;Sitting   Lower Body Bathing: Moderate assistance;Sit to/from stand   Upper Body Dressing : Set up;Supervision/safety;Sitting;Standing Upper Body Dressing Details (indicate cue type and reason): for shirt and brace Lower Body Dressing: Moderate assistance;Sit to/from stand Lower Body Dressing Details (indicate cue type and reason): Pt unable to cross foot over opposite knee. Wife to assist as needed. Pt required assist to start underwear and pants over feet and to pull up in standing Toilet Transfer: Min guard;Ambulation;RW Toilet Transfer Details (indicate cue type and reason): Simulated by sit to stand from EOB with functional mobility   Toileting - Clothing Manipulation Details (indicate cue type and reason): Educated pt on proper technique for peri care without twisting, use of wet wipes, and toilet aide if needed Tub/ Shower Transfer: Min guard;Tub transfer;Ambulation;Shower Field seismologist Details (indicate cue type and reason): Simulated tub transfer technqiue in room with min guard assist. Wife to supervise upon return home. Pt plans to use shower chair for energy conservation and safety Functional mobility during ADLs: Min guard;Rolling walker General ADL Comments: Educated pt on maintaining back precautions during functional activities, keeping frequently used items at [employer] top height, log roll technique for bed mobility     Vision  Perception     Praxis      Pertinent Vitals/Pain Pain Assessment: Faces Faces Pain Scale: Hurts little more Pain Location: back Pain  Descriptors / Indicators: Sore Pain Intervention(s): Monitored during session;Premedicated before session;Repositioned     Hand Dominance     Extremity/Trunk Assessment Upper Extremity Assessment Upper Extremity Assessment: Overall WFL for tasks assessed   Lower Extremity Assessment Lower Extremity Assessment: Defer to PT evaluation   Cervical / Trunk Assessment Cervical / Trunk Assessment: Other exceptions Cervical / Trunk Exceptions: s/p spinal sx   Communication Communication Communication: No difficulties   Cognition Arousal/Alertness: Awake/alert Behavior During Therapy: WFL for tasks assessed/performed Overall Cognitive Status: Within Functional Limits for tasks assessed                                     General Comments       Exercises     Shoulder Instructions      Home Living Family/patient expects to be discharged to:: Private residence Living Arrangements: Spouse/significant other Available Help at Discharge: Family;Available 24 hours/day Type of Home: House Home Access: Stairs to enter [employer] of Steps: 4 Entrance Stairs-Rails: Right;Left Home Layout: One level     Bathroom Shower/Tub: IT trainer: Standard     Home Equipment: Shower seat          Prior Functioning/Environment Level of Independence: Independent                 OT Problem List:        OT Treatment/Interventions:      OT Goals(Current goals can be found in the care plan section) Acute Rehab OT Goals Patient Stated Goal: return home OT Goal Formulation: All assessment and education complete, DC therapy  OT Frequency:     Barriers to D/C:            Co-evaluation              AM-PAC PT "6 Clicks" Daily Activity     Outcome Measure Help from another person eating meals?: None Help from another person taking care of personal grooming?: A Little Help from another person toileting, which includes  using toliet, bedpan, or urinal?: A Little Help from another person bathing (including washing, rinsing, drying)?: A Lot Help from another person to put on and taking off regular upper body clothing?: A Little Help from another person to put on and taking off regular lower body clothing?: A Lot 6 Click Score: 17   End of Session Equipment Utilized During Treatment: Rolling walker;Back brace Nurse Communication: Mobility status;Other (comment) (no equipment or f/u needs)  Activity Tolerance: Patient tolerated treatment well Patient left:  (standing in hallway with PT)  OT Visit Diagnosis: Unsteadiness on feet (R26.81);Pain Pain - part of body:  (back)                Time: 0981-1914 OT Time Calculation (min): 16 min Charges:  OT General Charges $OT Visit: 1 Visit OT Evaluation $OT Eval Moderate Complexity: 1 Mod G-Codes:     Jessina Marse A. Brett Albino, M.S., OTR/L Pager: [phone]  Gaye Alken 03/14/2017, 8:33 AM

## 2017-03-14 NOTE — Discharge Summary (Signed)
Date of admission: 03/13/2017 Date of discharge: 03/14/2017 Admitting diagnosis: Spondylolisthesis L5-S1 with lumbar radiculopathy Discharge and final diagnosis: Spondylolisthesis L5-S1 with lumbar radiculopathy. BMI 40. Condition on discharge: Improved Hospital course: Patient was admitted to undergo surgical decompression at L5-S1 which he tolerated well. His incision is clean and dry. He is ambulatory. He is discharged home.  Discharge medication: Percocet 10/323 #60 without refills. Robaxin 500 mg #40 with 3 refills.

## 2017-03-16 LAB — POCT I-STAT 4, (NA,K, GLUC, HGB,HCT)
GLUCOSE: 132 mg/dL — AB (ref 65–99)
HCT: 39 % (ref 39.0–52.0)
HEMOGLOBIN: 13.3 g/dL (ref 13.0–17.0)
Potassium: 4.2 mmol/L (ref 3.5–5.1)
Sodium: 140 mmol/L (ref 135–145)

## 2017-03-16 NOTE — Op Note (Signed)
PREOP DIAGNOSIS:  1. Spondylolisthesis, L5-S1 2. Lumbago with radiculopathy  POSTOP DIAGNOSIS: Same  PROCEDURE: 1. L5 laminectomy with complete facetectomy [name] for decompression of exiting nerve roots, more than would be required for placement of interbody graft 2. Placement of anterior interbody device - [employer] expandable cage x2 3. Posterior non-segmental  instrumentation using pedicle screws at L5 - S1 4. Interbody arthrodesis, L5-S1 5. Posterolateral arthrodesis, L5-S1 6. Use of locally harvested bone autograft 7. Use of non-structural bone allograft - BMP  SURGEON: Dr. Lisbeth Renshaw, MD  ASSISTANT: Dr. Temple Pacini, MD  ANESTHESIA: General Endotracheal  EBL: 1000cc  SPECIMENS: None  DRAINS: None  COMPLICATIONS: None immediate  CONDITION: Hemodynamically stable to PACU  HISTORY: Sean Murillo is a 41 y.o. male who has been followed in the outpatient clinic with back and leg pain related to spondylolysis at L5-S1. He attempted multiple conservative treatments and ultimately elected to proceed with surgical decompression and fusion. Risks and benefits were reviewed and consent was obtained.  PROCEDURE IN DETAIL: After informed consent was obtained and witnessed, the patient was brought to the operating room. After induction of general anesthesia, the patient was positioned on the operative table in the prone position. All pressure points were meticulously padded. Incision was then marked out and prepped and draped in the usual sterile fashion.  After timeout was conducted, skin was infiltrated with local anesthetic. Skin incision was then made sharply and Bovie electrocautery was used to dissect the subcutaneous tissue until the lumbodorsal fascia was identified and incised. The muscle was then elevated in the subperiosteal plane and the L5 and S1 lamina, and L4-5, L5-S1 facet complexes were identified. Transverse processes were identified. Self-retaining  retractors were then placed.  At this point attention was turned to decompression. Complete L5 laminectomy with facetectomy was completed using a combination of Kerrison rongeurs and a high-speed drill. The pars defect was identified and there was significant instability at L5-S1. Good decompression of the exiting and traversing roots was confirmed.  Disc space was then identified, incised, and using a combination of shavers, curettes and rongeurs, complete discectomy was completed. Endplates were prepared, and bilateral 10mm expandable cages was tapped into place.  Bone harvested during decompression was mixed with BMP and packed into the interspace prior to cage insertion. Good position was confirmed with fluoroscopy.  At this point, the pedicle screws were drilled and tapped and 6.5 x 45mm screws placed in L5 and 7.5 x 40mm screws placed in S1. Prebent lordotic rod was then placed, set screws placed and final tightened. Final AP and lateral fluoroscopic images confirmed good position.    At this point, the facet complexes and transverse processes were decorticated with a high-speed drill.  The remainder of the bone autograft which had been morselized was placed into the lateral gutters with Bmp for posterolateral arthrodesis.  The wound was then irrigated with copious amounts of antibiotic saline, then closed in standard fashion using a combination of interrupted 0 and 3-0 Vicryl stitches in the muscular, fascial, and subcutaneous layers. Skin was then closed using standard Dermabond. Sterile dressing was then applied. The patient was then transferred to the stretcher, extubated, and taken to the postanesthesia care unit in stable hemodynamic condition.  At the end of the case all sponge, needle, cottonoid, and instrument counts were correct.

## 2017-03-18 ENCOUNTER — Emergency Department (HOSPITAL_COMMUNITY)
Admission: EM | Admit: 2017-03-18 | Discharge: 2017-03-18 | Disposition: A | Discharge status: Home / Self Care | Payer: 59 | Attending: Emergency Medicine | Admitting: Emergency Medicine

## 2017-03-18 ENCOUNTER — Emergency Department (HOSPITAL_BASED_OUTPATIENT_CLINIC_OR_DEPARTMENT_OTHER): Payer: 59

## 2017-03-18 ENCOUNTER — Encounter (HOSPITAL_COMMUNITY): Payer: Self-pay

## 2017-03-18 DIAGNOSIS — M7989 Other specified soft tissue disorders: Secondary | ICD-10-CM

## 2017-03-18 DIAGNOSIS — R200 Anesthesia of skin: Secondary | ICD-10-CM | POA: Diagnosis not present

## 2017-03-18 DIAGNOSIS — Z9889 Other specified postprocedural states: Secondary | ICD-10-CM | POA: Insufficient documentation

## 2017-03-18 DIAGNOSIS — R2241 Localized swelling, mass and lump, right lower limb: Secondary | ICD-10-CM | POA: Insufficient documentation

## 2017-03-18 DIAGNOSIS — Z79899 Other long term (current) drug therapy: Secondary | ICD-10-CM | POA: Diagnosis not present

## 2017-03-18 LAB — CBC WITH DIFFERENTIAL/PLATELET
BASOS PCT: 1 %
Basophils Absolute: 0.2 10*3/uL — ABNORMAL HIGH (ref 0.0–0.1)
EOS PCT: 4 %
Eosinophils Absolute: 0.6 10*3/uL (ref 0.0–0.7)
HCT: 32.8 % — ABNORMAL LOW (ref 39.0–52.0)
Hemoglobin: 11.3 g/dL — ABNORMAL LOW (ref 13.0–17.0)
LYMPHS ABS: 5.3 10*3/uL — AB (ref 0.7–4.0)
Lymphocytes Relative: 33 %
MCH: 28.4 pg (ref 26.0–34.0)
MCHC: 34.5 g/dL (ref 30.0–36.0)
MCV: 82.4 fL (ref 78.0–100.0)
MONO ABS: 1.6 10*3/uL — AB (ref 0.1–1.0)
Monocytes Relative: 10 %
NEUTROS ABS: 8.4 10*3/uL — AB (ref 1.7–7.7)
Neutrophils Relative %: 52 %
Platelets: 417 10*3/uL — ABNORMAL HIGH (ref 150–400)
RBC: 3.98 MIL/uL — ABNORMAL LOW (ref 4.22–5.81)
RDW: 14.4 % (ref 11.5–15.5)
WBC: 16.1 10*3/uL — AB (ref 4.0–10.5)

## 2017-03-18 LAB — PROTIME-INR
INR: 1.03
Prothrombin Time: 13.4 seconds (ref 11.4–15.2)

## 2017-03-18 LAB — COMPREHENSIVE METABOLIC PANEL
ALBUMIN: 3 g/dL — AB (ref 3.5–5.0)
ALT: 19 U/L (ref 17–63)
AST: 19 U/L (ref 15–41)
Alkaline Phosphatase: 46 U/L (ref 38–126)
Anion gap: 10 (ref 5–15)
BILIRUBIN TOTAL: 0.3 mg/dL (ref 0.3–1.2)
BUN: 5 mg/dL — AB (ref 6–20)
CHLORIDE: 100 mmol/L — AB (ref 101–111)
CO2: 26 mmol/L (ref 22–32)
Calcium: 8.9 mg/dL (ref 8.9–10.3)
Creatinine, Ser: 0.81 mg/dL (ref 0.61–1.24)
GFR calc Af Amer: >60 mL/min (ref >60)
GLUCOSE: 104 mg/dL — AB (ref 65–99)
POTASSIUM: 4 mmol/L (ref 3.5–5.1)
Sodium: 136 mmol/L (ref 135–145)
TOTAL PROTEIN: 6.8 g/dL (ref 6.5–8.1)

## 2017-03-18 MED ORDER — FUROSEMIDE 20 MG PO TABS: 20.0000 mg | ORAL_TABLET | Freq: Every day | ORAL | 0 refills | Status: DC

## 2017-03-18 NOTE — Progress Notes (Signed)
*  PRELIMINARY RESULTS* Vascular Ultrasound Right lower extremity venous duplex has been completed.  Preliminary findings: No evidence of deep vein thrombosis or baker's cyst in the right lower extremity.   Chauncey Fischer 03/18/2017, 8:35 AM

## 2017-03-18 NOTE — Discharge Instructions (Signed)
He will need to have an ultrasound of your leg repeated in 2 weeks  You will need to follow-up with your neurosurgeon this week. I have discussed this with them at the office and they state that they will try to make a follow-up appointment for you. However if you should develop increasing pain, swelling, numbness or any weakness please return to the emergency department immediately.  Lasix 20 mg by mouth once a day for 5 days. This will make you urinate more often to get some of the fluid off

## 2017-03-18 NOTE — ED Triage Notes (Signed)
Pt states that he had surgery on Friday on his back, today began to have numbness and swelling in R lower leg. No redness, denies SOB.

## 2017-03-18 NOTE — ED Provider Notes (Addendum)
[facility]-EMERGENCY DEPT Provider Note   CSN: [id number] Arrival date & time: 03/18/17  0126     History   Chief Complaint Chief Complaint  Patient presents with  . Post-op Problem    HPI Sean Murillo is a 41 y.o. male.  HPI  The patient is a 41 year old male, history of arthritis, recently had a L5-S1 fusion approximately 5 days ago, has done well after the surgery with minimal back pain, in fact his back pain has improved significantly and the pain was going down his right leg has also stopped. He has noticed over the last couple of days some increased swelling of his right lower extremity below the knee. He has a stocking glove distribution feeling of numbness below the knee. He has not had any difficulty walking with his walker outside of his postoperative pain. This swelling has been persistent, gradually worsening, it is not associated with coughing shortness of breath chest pain or fevers. He has never had a blood clot.  Past Medical History:  Diagnosis Date  . Arthritis    lumbar spine & above- per pt.   . Depression    PTSD  . History of kidney stones    passed spontaneously x1  . PTSD (post-traumatic stress disorder)   . [facility] spotted fever   . Sleep apnea    severe- on Cpap- q night   . Spondylolisthesis of lumbar region     Patient Active Problem List   Diagnosis Date Noted  . Spondylolisthesis at L5-S1 level 03/13/2017  . Morbid obesity (HCC) 04/05/2014  . Partial small bowel obstruction (HCC) 04/04/2014  . Leukocytosis 04/04/2014    Past Surgical History:  Procedure Laterality Date  . cyst removed from right shoulder    . HERNIA REPAIR  2014, 2007   [facility] x2, also in 2001- inguinal - repair- R side   . MANDIBLE SURGERY    . SPLENECTOMY, TOTAL         Home Medications    Prior to Admission medications   Medication Sig Start Date End Date Taking? Authorizing Provider  acetaminophen (TYLENOL) 650 MG CR tablet Take 650 mg by mouth every  8 (eight) hours as needed for pain.    [provider]  Cholecalciferol (VITAMIN D PO) Take 1 capsule by mouth daily.    [provider]  Cyanocobalamin (VITAMIN B-12 CR PO) Take 1 tablet by mouth daily.    [provider]  furosemide (LASIX) 20 MG tablet Take 1 tablet (20 mg total) by mouth daily. 03/18/17 03/23/17  Eber Hong, MD  methocarbamol (ROBAXIN) 500 MG tablet Take 1 tablet (500 mg total) by mouth every 6 (six) hours as needed for muscle spasms. 03/14/17   Barnett Abu, MD  ondansetron (ZOFRAN) 4 MG tablet Take 1 tablet (4 mg total) by mouth every 8 (eight) hours as needed for nausea or vomiting. Patient not taking: Reported on 03/06/2017 03/04/17   Mesner, Barbara Cower, MD  oxyCODONE-acetaminophen (ROXICET) 5-325 MG tablet Take 1 tablet by mouth every 4 (four) hours as needed for moderate pain or severe pain. 03/14/17 03/14/18  Barnett Abu, MD  sertraline (ZOLOFT) 50 MG tablet Take 50 mg by mouth daily.    [provider]    Family History Family History  Problem Relation Age of Onset  . Stroke Mother     Social History Social History  Substance Use Topics  . Smoking status: Never Smoker  . Smokeless tobacco: Current User    Types: Snuff  Comment: working on it  . Alcohol use No     Comment: rare     Allergies   Contrast media [iodinated diagnostic agents] and Iohexol   Review of Systems Review of Systems  All other systems reviewed and are negative.    Physical Exam Updated Vital Signs BP (!) 141/103 (BP Location: Left Wrist)   Pulse 90   Temp 99.1 F (37.3 C) (Oral)   Resp 16   Ht  (1.753 m)   Wt (!) 154.2 kg (340 lb)   SpO2 98%   BMI 50.21 kg/m   Physical Exam  Constitutional: He appears well-developed and well-nourished. No distress.  HENT:  Head: Normocephalic and atraumatic.  Mouth/Throat: Oropharynx is clear and moist. No oropharyngeal exudate.  Eyes: Pupils are equal, round, and reactive to light.  Conjunctivae and EOM are normal. Right eye exhibits no discharge. Left eye exhibits no discharge. No scleral icterus.  Neck: Normal range of motion. Neck supple. No JVD present. No thyromegaly present.  Cardiovascular: Normal rate, regular rhythm, normal heart sounds and intact distal pulses.  Exam reveals no gallop and no friction rub.   No murmur heard. Pulmonary/Chest: Effort normal and breath sounds normal. No respiratory distress. He has no wheezes. He has no rales.  Abdominal: Soft. Bowel sounds are normal. He exhibits no distension and no mass. There is no tenderness.  Very obese  Musculoskeletal: Normal range of motion. He exhibits edema. He exhibits no tenderness.  The patient has normal strength in his bilateral lower extremities, he is able to straight leg raise bilaterally, the joints are supple however there is right lower extremity swelling below the knee. Mild pitting edema bilaterally  Lymphadenopathy:    He has no cervical adenopathy.  Neurological: He is alert. Coordination normal.  Normal strength to the bilateral lower extremities at L4-L5 and S1. Sensation is decreased on the right side which appears to be stocking glove proximal calf through the foot.  Skin: Skin is warm and dry. No rash noted. No erythema.  Psychiatric: He has a normal mood and affect. His behavior is normal.  Nursing note and vitals reviewed.    ED Treatments / Results  Labs (all labs ordered are listed, but only abnormal results are displayed) Labs Reviewed  CBC WITH DIFFERENTIAL/PLATELET - Abnormal; Notable for the following:       Result Value   WBC 16.1 (*)    RBC 3.98 (*)    Hemoglobin 11.3 (*)    HCT 32.8 (*)    Platelets 417 (*)    Neutro Abs 8.4 (*)    Lymphs Abs 5.3 (*)    Monocytes Absolute 1.6 (*)    Basophils Absolute 0.2 (*)    All other components within normal limits  COMPREHENSIVE METABOLIC PANEL - Abnormal; Notable for the following:    Chloride 100 (*)    Glucose, Bld 104  (*)    BUN 5 (*)    Albumin 3.0 (*)    All other components within normal limits  PROTIME-INR     Radiology No results found.  Procedures Procedures (including critical care time)  Medications Ordered in ED Medications - No data to display   Initial Impression / Assessment and Plan / ED Course  I have reviewed the triage vital signs and the nursing notes.  Pertinent labs & imaging results that were available during my care of the patient were reviewed by me and considered in my medical decision making (see chart for details).  The patient has unilateral swelling of the right lower extremity postoperatively raising concern for blood clot especially given his limited mobility. We'll obtain an ultrasound of the leg as well as labs in case he needs to be anticoagulated. He has neurologic symptoms but they seem to be stocking glove and not radicular raising suspicion that this is more likely to be a DVT then a focal neurologic deficit especially given his lack of pain in his back or going down the legs or any lack of weakness.  Discussed case with Vinnie - PA for Dr. Conchita Paris - they will schedule f/u for pt in the office - states doesn't think is related to surgery  No DVT on [place] - needs repeat in 2 weeks if still swollen Lasix for 5 days Pt informed of plan and in agreement  The patient does have no tenderness on exam, his leukocytosis is nonspecific and 16,000 as it does not correlate with an infectious process. Again he is not having back pain to suggest that he has a postoperative infection and he has no fever.  Vitals:   03/18/17 0137 03/18/17 0142 03/18/17 0632  BP: (!) 136/92  (!) 141/103  Pulse: 94  90  Resp: 20  16  Temp: 98.8 F (37.1 C)  99.1 F (37.3 C)  TempSrc: Oral  Oral  SpO2: 98%  98%  Weight:  (!) 154.2 kg (340 lb)   Height:   (1.753 m)      Final Clinical Impressions(s) / ED Diagnoses   Final diagnoses:  Localized swelling of lower extremity    Numbness of right foot    New Prescriptions New Prescriptions   FUROSEMIDE (LASIX) 20 MG TABLET    Take 1 tablet (20 mg total) by mouth daily.     Eber Hong, MD 03/18/17 1035    Eber Hong, MD 03/18/17 [phone]

## 2017-03-23 MED FILL — Thrombin For Soln 5000 Unit: CUTANEOUS | Qty: 5000 | Status: AC

## 2017-03-23 MED FILL — Gelatin Absorbable MT Powder: OROMUCOSAL | Qty: 1 | Status: AC

## 2017-05-29 ENCOUNTER — Ambulatory Visit (HOSPITAL_COMMUNITY): Payer: 59 | Attending: Neurosurgery

## 2017-05-29 DIAGNOSIS — M6281 Muscle weakness (generalized): Secondary | ICD-10-CM

## 2017-05-29 DIAGNOSIS — M5441 Lumbago with sciatica, right side: Secondary | ICD-10-CM | POA: Insufficient documentation

## 2017-05-29 DIAGNOSIS — R2689 Other abnormalities of gait and mobility: Secondary | ICD-10-CM | POA: Diagnosis present

## 2017-05-29 NOTE — Therapy (Signed)
[facility] [address] [place], [place], [address] Phone: [phone]   Fax:  [phone]  Physical Therapy Evaluation  Patient Details  Name: Sean Murillo MRN: [id number] Date of Birth: 11/17/1975 Referring Provider: Dr. Conchita Paris   Encounter Date: 05/29/2017  PT End of Session - 05/29/17 1725    Visit Number  1    Number of Visits  20    Date for PT Re-Evaluation  06/28/17    Authorization Type  [employer]     Authorization Time Period  05/29/17-07/29/17    Authorization - Visit Number  1    PT Start Time  1431    PT Stop Time  1515    PT Time Calculation (min)  44 min    Activity Tolerance  Patient tolerated treatment well;Patient limited by pain;Patient limited by fatigue       Past Medical History:  Diagnosis Date  . Arthritis    lumbar spine & above- per pt.   . Depression    PTSD  . History of kidney stones    passed spontaneously x1  . PTSD (post-traumatic stress disorder)   . [facility] spotted fever   . Sleep apnea    severe- on Cpap- q night   . Spondylolisthesis of lumbar region     Past Surgical History:  Procedure Laterality Date  . cyst removed from right shoulder    . HERNIA REPAIR  2014, 2007   [facility] x2, also in 2001- inguinal - repair- R side   . MANDIBLE SURGERY    . SPLENECTOMY, TOTAL      There were no vitals filed for this visit.   Subjective Assessment - 05/29/17 1436    Subjective  Sean Murillo is a 41yo white male who underwent fusion L5/S1 September 2018. Prior to surgery, Pt had insidious inset of low back pain with Right sided sciatica into the foot. Since surgery, his radicular symptoms have resolved, but he now has numbness in the plantar surface of the foot and the lateral part of the calf. Pt has also had significan tback pain since surgery.  Pt has been using a walker/WC for most mobility since surgery d/t progressive back pain.     Patient is accompained by:  Family member    Pertinent History  No prior mobility deficits prior to surgery; pt has been less mobile and has gained about 20 lbs    How long can you sit comfortably?  10 minutes    How long can you stand comfortably?  10 minutes    How long can you walk comfortably?  5-10 minutes    Currently in Pain?  Yes    Pain Score  8     Pain Location  Back near surgical incision    Pain Orientation  Mid    Pain Descriptors / Indicators  Sharp    Pain Type  Surgical pain    Pain Onset  More than a month ago    Pain Frequency  Intermittent    Aggravating Factors   being mobile     Pain Relieving Factors  resting          [facility] PT Assessment - 05/29/17 0001      Assessment   Medical Diagnosis  s/p Right L5/S1    Referring Provider  Dr. Conchita Paris    Onset Date/Surgical Date  -- March 13, 2017     Hand Dominance  Right    Next MD Visit  Monday 06/01/17    Prior Therapy  None      Precautions   Precautions  None;Back Bending, Lifting, Twisting    Required Braces or Orthoses  -- done with LSO brace      Restrictions   Weight Bearing Restrictions  No      Balance Screen   Has the patient fallen in the past 6 months  Yes    How many times?  1    Has the patient had a decrease in activity level because of a fear of falling?   No    Is the patient reluctant to leave their home because of a fear of falling?   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, walking, standing, bending twisting, llifting      Observation/Other Assessments   Focus on Therapeutic Outcomes (FOTO)   10 (90% impairment)       Sensation   Light Touch  Impaired Detail    Light Touch Impaired Details  Impaired RLE >75% loss of L5/S1 dermatome      ROM / Strength   AROM / PROM /  Strength  Strength      Strength   Strength Assessment Site  Hip;Knee;Ankle    Right/Left Hip  Right;Left    Right Hip Flexion  4+/5    Right Hip External Rotation   3+/5 limited by knee pain    Right Hip Internal Rotation  4+/5 limited by knee pain     Right Hip ABduction  -- horizontal abduction: 5/5    Right Hip ADduction  5/5    Left Hip Flexion  5/5    Left Hip External Rotation  5/5    Left Hip Internal Rotation  5/5    Left Hip ABduction  -- horizontal abduction: 5/5    Left Hip ADduction  5/5    Right/Left Knee  Right;Left    Right Knee Flexion  4/5    Right Knee Extension  4+/5    Left Knee Flexion  5/5    Left Knee Extension  5/5    Right Ankle Dorsiflexion  5/5    Right Ankle Plantar Flexion  5/5 seated soleus     Left Ankle Dorsiflexion  5/5    Left Ankle Plantar Flexion  5/5 seated soleus       Transfers   Five time sit to stand comments   4x tolerated, but limited by back pain  25sec              Objective measurements completed on examination: See above findings.      [facility] Adult PT Treatment/Exercise - 05/29/17 0001      Ambulation/Gait   Ambulation Distance (Feet)  225 Feet    Assistive device  Rollator    Gait velocity  0.3860m/s     Gait Comments  slow antalgic adn symmetrical       Exercises   Exercises  Lumbar      Lumbar Exercises: Stretches   Single Knee to Chest Stretch  2 reps;30 seconds HEP education      Lumbar Exercises: Seated   Other Seated Lumbar  Exercises  Marching seated: 1x10  HEP education      Lumbar Exercises: Supine   Heel Slides  15 reps bilat    Heel Slides Limitations  HEP education    Other Supine Lumbar Exercises  Belly Breathing Transverse Abd activation:  10x (abdominal draw in maneuver) HEP education             PT Education - 05/29/17 1724    Education provided  Yes    Education Details  Importance of avoiding addition weight gain at this time; how deep abdominal layers help activate lumbar  stabilizers/multifidus    Person(s) Educated  Patient;Spouse    Methods  Explanation;Demonstration;Tactile cues    Comprehension  Returned demonstration;Verbalized understanding;Need further instruction;Verbal cues required;Tactile cues required       PT Short Term Goals - 05/29/17 1734      PT SHORT TERM GOAL #1   Title  After 4 weeks patien twill demonstrate ability to tolerate 500+feet AMB at >0.62m/s without increase in back pain.     Status  New      PT SHORT TERM GOAL #2   Title  After 4 weeks patient will demonstrate 5xSTS hands free <20 seconds.     Status  New      PT SHORT TERM GOAL #3   Title  After 2 weeks patient will demonstrate independence in basic HEP to manage pain and improve strength.     Status  New        PT Long Term Goals - 05/29/17 1736      PT LONG TERM GOAL #1   Title  After 8 weeks patient will demonstrate strength 5/5 in all tested muscle groups in order to reduce pain and assist in return to work.      PT LONG TERM GOAL #2   Title  After 8 weeks patient will demonstrate ability to perform 1500+feet at 1.43m/s or faster without exacerbation of pain.       PT LONG TERM GOAL #3   Title  After 8 weeks patient will demonstrate proper technique for lifting loads of 25lbs 20x without exacerbation of pain to prepare for return to work.     Status  New      PT LONG TERM GOAL #4   Title  After 8 weeks patient will demonstrate 5x STS hands free in less than 14seconds to demonstrate improvement in functional strength.     Status  New             Plan - 05/29/17 1726    Clinical Impression Statement  Pt presenting s/p lumbar fusion with residual loss of sensation in the Rt L5/S1 dermatome. Pt continues to be limited by low back pain both at rest and with mobility in adn out of home. He is unable to perform independent self care at this time due to surgical precautions, pain, and large habitus. Pt demonstrates weakness in the RLE. AMB islimited to  speeds less than appriopriate for accessing the community.     History and Personal Factors relevant to plan of care:  [employer]. 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     Clinical Presentation  Stable    Clinical Presentation due to:  consistent pain and limitations to activity. Seem to improve with rest.     Clinical Decision Making  Moderate    Rehab Potential  Good    Clinical Impairments Affecting Rehab Potential  recent weight gain    PT Frequency  3x / week    PT Duration  4 weeks then 2x weekly for 4 weeks    PT Treatment/Interventions  ADLs/Self Care Home Management;Moist Heat;Electrical Stimulation;DME Instruction;Gait training;Stair training;Functional mobility training;Therapeutic exercise;Balance training;Neuromuscular re-education;Patient/family education;Dry needling;Passive range of motion;Scar mobilization    PT Next Visit Plan  review exam, goals, HEP, soft tissue assessment of back and scar, education on scar mobility     PT Home Exercise Plan  SKTC, Heel slides, seated marching, ADIM     Recommended Other Services  OT consult for ADL performance?     Consulted and Agree with Plan of Care  Patient       Patient will benefit from skilled therapeutic intervention in order to improve the following deficits and impairments:  Hypomobility, Decreased activity tolerance, Decreased mobility, Difficulty walking, Postural dysfunction, Improper body mechanics, Decreased strength, Obesity, Pain  Visit Diagnosis: Acute midline low back pain with right-sided sciatica - Plan: PT plan of care cert/re-cert  Muscle weakness (generalized) - Plan: PT plan of care cert/re-cert  Other abnormalities of gait and mobility - Plan: PT plan of care cert/re-cert     Problem List Patient Active Problem List   Diagnosis Date Noted  . Spondylolisthesis at L5-S1 level 03/13/2017  . Morbid obesity (HCC) 04/05/2014  . Partial small bowel  obstruction (HCC) 04/04/2014  . Leukocytosis 04/04/2014   5:44 PM, 05/29/17 Rosamaria LintsAllan C Ferrel Simington, PT, DPT Physical Therapist at [facility] [phone] (office)      Rosamaria LintsBuccola,Emanual Lamountain C 05/29/2017, 5:44 PM  [facility] [address] [place], [place], [address] Phone: [phone]   Fax:  [phone]  Name: Gwenyth BenderMichael Parrales MRN: [id number] Date of Birth: 02/08/1976

## 2017-06-01 ENCOUNTER — Telehealth (HOSPITAL_COMMUNITY): Payer: Self-pay | Admitting: Family Medicine

## 2017-06-01 NOTE — Telephone Encounter (Signed)
06/01/17  9:13 left a message to offer him a 1:00 appt - he's on the wait list   MM

## 2017-06-03 ENCOUNTER — Encounter (HOSPITAL_COMMUNITY): Payer: Self-pay

## 2017-06-03 ENCOUNTER — Ambulatory Visit (HOSPITAL_COMMUNITY): Payer: 59 | Attending: Neurosurgery

## 2017-06-03 DIAGNOSIS — R29898 Other symptoms and signs involving the musculoskeletal system: Secondary | ICD-10-CM | POA: Diagnosis present

## 2017-06-03 DIAGNOSIS — M6281 Muscle weakness (generalized): Secondary | ICD-10-CM | POA: Diagnosis present

## 2017-06-03 DIAGNOSIS — M25561 Pain in right knee: Secondary | ICD-10-CM | POA: Insufficient documentation

## 2017-06-03 DIAGNOSIS — R2689 Other abnormalities of gait and mobility: Secondary | ICD-10-CM | POA: Diagnosis present

## 2017-06-03 DIAGNOSIS — M5441 Lumbago with sciatica, right side: Secondary | ICD-10-CM | POA: Insufficient documentation

## 2017-06-03 DIAGNOSIS — G8929 Other chronic pain: Secondary | ICD-10-CM | POA: Diagnosis present

## 2017-06-03 DIAGNOSIS — R2681 Unsteadiness on feet: Secondary | ICD-10-CM | POA: Diagnosis present

## 2017-06-03 NOTE — Therapy (Signed)
[facility] [address] [place], [place], [address] Phone: [phone]   Fax:  [phone]  Physical Therapy Treatment  Patient Details  Name: Sean BenderMichael Murillo MRN: [id number] Date of Birth: 12/09/1975 Referring Provider: Dr. Conchita ParisNundkumar   Encounter Date: 06/03/2017  PT End of Session - 06/03/17 1344    Visit Number  2    Number of Visits  20    Date for PT Re-Evaluation  06/28/17    Authorization Type  UHC     Authorization Time Period  05/29/17-07/29/17    Authorization - Visit Number  2    PT Start Time  1305    PT Stop Time  1344    PT Time Calculation (min)  39 min    Activity Tolerance  Patient tolerated treatment well;Patient limited by pain;Patient limited by fatigue pain scale range from 7-8/10       Past Medical History:  Diagnosis Date  . Arthritis    lumbar spine & above- per pt.   . Depression    PTSD  . History of kidney stones    passed spontaneously x1  . PTSD (post-traumatic stress disorder)   [facility]   . Sleep apnea    severe- on Cpap- q night   . Spondylolisthesis of lumbar region     Past Surgical History:  Procedure Laterality Date  . cyst removed from right shoulder    . [facility]  [facility]- inguinal - repair- R side   . MANDIBLE SURGERY    . SPLENECTOMY, TOTAL      There were no vitals filed for this visit.  Subjective Assessment - 06/03/17 1304    Subjective  Pt reports Lt sided lower back pain scale 7/10 sharp constant, especially with bending.  Reports complaince with HEP and walking program.    Pertinent History  No prior mobility deficits prior to surgery; pt has been less mobile and has gained about 20 lbs    Patient Stated Goals  Return to normal functional abilities    Currently in Pain?  Yes    Pain Score  7     Pain Location  Back    Pain Orientation  Lower;Lateral    Pain Descriptors / Indicators  Sharp    Pain Type  Surgical  pain    Pain Onset  More than a month ago    Pain Frequency  Constant    Aggravating Factors   being mobile    Pain Relieving Factors  resting         [facility] PT Assessment - 06/03/17 0001      Assessment   Medical Diagnosis  s/p Right L5/S1    Referring Provider  Dr. Conchita ParisNundkumar    Onset Date/Surgical Date  -- March 13, 2017    Hand Dominance  Right    Next MD Visit  Monday 07/02/2016    Prior Therapy  None      Precautions   Precautions  Back Bending, Lifting, Twisting                  [facility] Adult PT Treatment/Exercise - 06/03/17 0001      Lumbar Exercises: Stretches   Single Knee to Chest Stretch  2 reps;30 seconds      Lumbar Exercises: Supine   Ab Set  10 reps;5 seconds    Heel Slides  15 reps    Heel Slides Limitations  Cueing for ab set with activity and breathing    Bent Knee Raise  10 reps;3 seconds    Other Supine Lumbar Exercises  Belly Breathing Transverse Abd activation: 10x5"      Lumbar Exercises: Sidelying   Clam  10 reps RTB      Manual Therapy   Manual Therapy  Other (comment);Myofascial release    Manual therapy comments  Assessed fot tight soft tissue of back and scar.  Instructed scar tissue massage    Myofascial Release  scar tissue massage, instructed wife cross friction massage to address scar adhesions (minimal)    Other Manual Therapy  assessed for tightness in lower back, no spasms/tightness noted with QL, paraspinals, noted adipore tissue             PT Education - 06/03/17 1327    Education provided  Yes    Education Details  Reviewed goals, assured compliance and proper form/technique with HEP and copy of eval given to pt./wife.  Tactile/verbal cueing to improve lumbar stabilization with therex.    Person(s) Educated  Patient;Spouse    Methods  Demonstration;Explanation;Handout    Comprehension  Verbalized understanding;Returned demonstration;Verbal cues required;Tactile cues required       PT Short Term Goals -  05/29/17 1734      PT SHORT TERM GOAL #1   Title  After 4 weeks patien twill demonstrate ability to tolerate 500+feet AMB at >0.76532m/s without increase in back pain.     Status  New      PT SHORT TERM GOAL #2   Title  After 4 weeks patient will demonstrate 5xSTS hands free <20 seconds.     Status  New      PT SHORT TERM GOAL #3   Title  After 2 weeks patient will demonstrate independence in basic HEP to manage pain and improve strength.     Status  New        PT Long Term Goals - 05/29/17 1736      PT LONG TERM GOAL #1   Title  After 8 weeks patient will demonstrate strength 5/5 in all tested muscle groups in order to reduce pain and assist in return to work.      PT LONG TERM GOAL #2   Title  After 8 weeks patient will demonstrate ability to perform 1500+feet at 1.6632m/s or faster without exacerbation of pain.       PT LONG TERM GOAL #3   Title  After 8 weeks patient will demonstrate proper technique for lifting loads of 25lbs 20x without exacerbation of pain to prepare for return to work.     Status  New      PT LONG TERM GOAL #4   Title  After 8 weeks patient will demonstrate 5x STS hands free in less than 14seconds to demonstrate improvement in functional strength.     Status  New            Plan - 06/03/17 1402    Clinical Impression Statement  Reviewed goals, assured compliance and proper form with HEP, copy of eval given to pt.  Reviewed HEP with verbal and tactile cueing to improve lumbar stability.  Pt able to verbalize and demonstrate appropriate bed mechanics as well as verbalize precautions (no BLT).  Assessed soft tissue and scar mobility.  No musculature restrictions noted wiht palpations, did complete scar tissue cross friction massage and instructed to wife with verbal understanding,     Rehab Potential  Good  Clinical Impairments Affecting Rehab Potential  recent weight gain    PT Frequency  3x / week    PT Duration  4 weeks then 2x/week for 4 weeks    PT  Treatment/Interventions  ADLs/Self Care Home Management;Moist Heat;Electrical Stimulation;DME Instruction;Gait training;Stair training;Functional mobility training;Therapeutic exercise;Balance training;Neuromuscular re-education;Patient/family education;Dry needling;Passive range of motion;Scar mobilization    PT Next Visit Plan  Continue with core and proximal strengthening exercises (noted flexion based for pain control this session).  F/U with scar mobility techniques at home.    PT Home Exercise Plan  SKTC, Heel slides, seated marching, ADIM        Patient will benefit from skilled therapeutic intervention in order to improve the following deficits and impairments:  Hypomobility, Decreased activity tolerance, Decreased mobility, Difficulty walking, Postural dysfunction, Improper body mechanics, Decreased strength, Obesity, Pain  Visit Diagnosis: Acute midline low back pain with right-sided sciatica  Muscle weakness (generalized)  Other abnormalities of gait and mobility     Problem List Patient Active Problem List   Diagnosis Date Noted  . Spondylolisthesis at L5-S1 level 03/13/2017  . Morbid obesity (HCC) 04/05/2014  . Partial small bowel obstruction (HCC) 04/04/2014  . Leukocytosis 04/04/2014   Becky Sax, LPTA; CBIS [phone]  Juel Burrow 06/03/2017, 3:04 PM  [facility] [address] [place], [place], [address] Phone: [phone]   Fax:  [phone]  Name: Tamarion Haymond MRN: [id number] Date of Birth: January 23, 1976

## 2017-06-05 ENCOUNTER — Ambulatory Visit (HOSPITAL_COMMUNITY): Payer: 59

## 2017-06-05 ENCOUNTER — Telehealth (HOSPITAL_COMMUNITY): Payer: Self-pay | Admitting: Family Medicine

## 2017-06-05 NOTE — Telephone Encounter (Signed)
06/05/17  Wife called to cx said that he was sick

## 2017-06-08 ENCOUNTER — Ambulatory Visit (HOSPITAL_COMMUNITY): Payer: 59 | Admitting: Physical Therapy

## 2017-06-10 ENCOUNTER — Other Ambulatory Visit: Payer: Self-pay

## 2017-06-10 ENCOUNTER — Encounter (HOSPITAL_COMMUNITY): Payer: Self-pay

## 2017-06-10 ENCOUNTER — Ambulatory Visit (HOSPITAL_COMMUNITY): Payer: 59

## 2017-06-10 DIAGNOSIS — M6281 Muscle weakness (generalized): Secondary | ICD-10-CM

## 2017-06-10 DIAGNOSIS — R2689 Other abnormalities of gait and mobility: Secondary | ICD-10-CM

## 2017-06-10 DIAGNOSIS — M5441 Lumbago with sciatica, right side: Secondary | ICD-10-CM | POA: Diagnosis not present

## 2017-06-10 NOTE — Therapy (Signed)
[facility] [address] , [place], [address] Phone: [phone]   Fax:  [phone]  Physical Therapy Treatment  Patient Details  Name: Sean BenderMichael Tuohy MRN: [id number] Date of Birth: 01/23/1976 Referring Provider: Dr. Conchita ParisNundkumar   Encounter Date: 06/10/2017  PT End of Session - 06/10/17 1259    Visit Number  3    Number of Visits  20    Date for PT Re-Evaluation  06/28/17    Authorization Type  UHC     Authorization Time Period  05/29/17-07/29/17    Authorization - Visit Number  3    PT Start Time  1300    PT Stop Time  1340    PT Time Calculation (min)  40 min    Activity Tolerance  Patient tolerated treatment well;Patient limited by pain;Patient limited by fatigue pain scale range from 7-8/10       Past Medical History:  Diagnosis Date  . Arthritis    lumbar spine & above- per pt.   . Depression    PTSD  . History of kidney stones    passed spontaneously x1  . PTSD (post-traumatic stress disorder)   . [facility]   . Sleep apnea    severe- on Cpap- q night   . Spondylolisthesis of lumbar region     Past Surgical History:  Procedure Laterality Date  . cyst removed from right shoulder    . HERNIA REPAIR  2014, 2007   [facility]- inguinal - repair- R side   . MANDIBLE SURGERY    . SPLENECTOMY, TOTAL      There were no vitals filed for this visit.  Subjective Assessment - 06/10/17 1259    Subjective  Pt states that his pain pill is starting to kick in now. He still has his normal pain and as long as he is sitting in his recliner he doesn't have pain, but as soon as he goes to get out of it, that's when his pain starts.     Pertinent History  No prior mobility deficits prior to surgery; pt has been less mobile and has gained about 20 lbs    Patient Stated Goals  Return to normal functional abilities    Currently in Pain?  Yes    Pain Score  7     Pain Location  Back    Pain  Orientation  Lower    Pain Descriptors / Indicators  Sharp    Pain Type  Surgical pain    Pain Onset  More than a month ago    Pain Frequency  Constant    Aggravating Factors   being mobile    Pain Relieving Factors  resting    Effect of Pain on Daily Activities  increases          [facility] Adult PT Treatment/Exercise - 06/10/17 0001      Lumbar Exercises: Stretches   Single Knee to Chest Stretch  2 reps;30 seconds    Single Knee to Chest Stretch Limitations  BLE, with sheet      Lumbar Exercises: Standing   Other Standing Lumbar Exercises  hip abd with RTB 2x10 reps each with ab set    Other Standing Lumbar Exercises  sidestepping in // bars 398ft x3RT with RTB; hip flexion in standing with BUE support x5reps each; x1 lap around gym with rollator at EOS      Lumbar Exercises: Seated   Sit to Stand  10 reps    Sit to Stand Limitations  2 sets, from mat (20")      Lumbar Exercises: Supine   Ab Set  10 reps;5 seconds    Bent Knee Raise  15 reps;3 seconds    Bent Knee Raise Limitations  with ab set    Dead Bug  15 reps    Dead Bug Limitations  with ab set    Bridge  15 reps    Bridge Limitations  glute max bridge + ankle DF to improve posterior chain engagement      Lumbar Exercises: Sidelying   Clam  15 reps    Clam Limitations  RTB          PT Education - 06/10/17 1259    Education provided  Yes    Education Details  exercise technique, ab set    Person(s) Educated  Patient    Methods  Explanation;Demonstration    Comprehension  Verbalized understanding;Returned demonstration       PT Short Term Goals - 05/29/17 1734      PT SHORT TERM GOAL #1   Title  After 4 weeks patien twill demonstrate ability to tolerate 500+feet AMB at >0.65m/s without increase in back pain.     Status  New      PT SHORT TERM GOAL #2   Title  After 4 weeks patient will demonstrate 5xSTS hands free <20 seconds.     Status  New      PT SHORT TERM GOAL #3   Title  After 2 weeks patient  will demonstrate independence in basic HEP to manage pain and improve strength.     Status  New        PT Long Term Goals - 05/29/17 1736      PT LONG TERM GOAL #1   Title  After 8 weeks patient will demonstrate strength 5/5 in all tested muscle groups in order to reduce pain and assist in return to work.      PT LONG TERM GOAL #2   Title  After 8 weeks patient will demonstrate ability to perform 1500+feet at 1.60m/s or faster without exacerbation of pain.       PT LONG TERM GOAL #3   Title  After 8 weeks patient will demonstrate proper technique for lifting loads of 25lbs 20x without exacerbation of pain to prepare for return to work.     Status  New      PT LONG TERM GOAL #4   Title  After 8 weeks patient will demonstrate 5x STS hands free in less than 14seconds to demonstrate improvement in functional strength.     Status  New            Plan - 06/10/17 1340    Clinical Impression Statement  Continued with established POC, increasing pt's reps and introducing pt to proximal hip and functional strengthening as tolerated. Pt initially had LBP with bridging but modified his positioning and he reported improved symptoms. Pt mainly had increased LBP with standing marches so did not continue after 5 reps. Encouraged daily walking at home and educated pt that he would likely have BLE soreness after today's session and he verbalized understanding.    Rehab Potential  Good    Clinical Impairments Affecting Rehab Potential  recent weight gain    PT Frequency  3x / week    PT Duration  4 weeks then 2x/week for 4 weeks    PT Treatment/Interventions  ADLs/Self  Care Home Management;Moist Heat;Electrical Stimulation;DME Instruction;Gait training;Stair training;Functional mobility training;Therapeutic exercise;Balance training;Neuromuscular re-education;Patient/family education;Dry needling;Passive range of motion;Scar mobilization    PT Next Visit Plan  Continue with core and proximal  strengthening exercises (noted flexion based for pain control this session) and progress as tolerated within pain    PT Home Exercise Plan  SKTC, Heel slides, seated marching, ADIM     Consulted and Agree with Plan of Care  Patient;Family member/caregiver    Family Member Consulted  wife       Patient will benefit from skilled therapeutic intervention in order to improve the following deficits and impairments:  Hypomobility, Decreased activity tolerance, Decreased mobility, Difficulty walking, Postural dysfunction, Improper body mechanics, Decreased strength, Obesity, Pain  Visit Diagnosis: Acute midline low back pain with right-sided sciatica  Muscle weakness (generalized)  Other abnormalities of gait and mobility     Problem List Patient Active Problem List   Diagnosis Date Noted  . Spondylolisthesis at L5-S1 level 03/13/2017  . Morbid obesity (HCC) 04/05/2014  . Partial small bowel obstruction (HCC) 04/04/2014  . Leukocytosis 04/04/2014      Jac CanavanBrooke Powell PT, DPT  [facility] [address] [place], [place], [address] Phone: [phone]   Fax:  [phone]  Name: Sean BenderMichael Dillehay MRN: [id number] Date of Birth: 11/13/1975

## 2017-06-12 ENCOUNTER — Ambulatory Visit (HOSPITAL_COMMUNITY): Payer: 59 | Admitting: Physical Therapy

## 2017-06-12 ENCOUNTER — Encounter (HOSPITAL_COMMUNITY): Payer: Self-pay | Admitting: Physical Therapy

## 2017-06-12 DIAGNOSIS — R29898 Other symptoms and signs involving the musculoskeletal system: Secondary | ICD-10-CM

## 2017-06-12 DIAGNOSIS — M6281 Muscle weakness (generalized): Secondary | ICD-10-CM

## 2017-06-12 DIAGNOSIS — M25561 Pain in right knee: Secondary | ICD-10-CM

## 2017-06-12 DIAGNOSIS — R2689 Other abnormalities of gait and mobility: Secondary | ICD-10-CM

## 2017-06-12 DIAGNOSIS — G8929 Other chronic pain: Secondary | ICD-10-CM

## 2017-06-12 DIAGNOSIS — M5441 Lumbago with sciatica, right side: Secondary | ICD-10-CM | POA: Diagnosis not present

## 2017-06-12 DIAGNOSIS — R2681 Unsteadiness on feet: Secondary | ICD-10-CM

## 2017-06-12 NOTE — Therapy (Signed)
[facility] [address] [place], [place], [address] Phone: [phone]   Fax:  [phone]  Physical Therapy Treatment  Patient Details  Name: Sean BenderMichael Hase MRN: [id number] Date of Birth: 01/18/1976 Referring Provider: Dr. Conchita ParisNundkumar   Encounter Date: 06/12/2017  PT End of Session - 06/12/17 1328    Visit Number  4    Number of Visits  20    Date for PT Re-Evaluation  06/28/17    Authorization Type  UHC     Authorization Time Period  05/29/17-07/29/17    Authorization - Visit Number  4    PT Start Time  1305    PT Stop Time  1345    PT Time Calculation (min)  40 min    Activity Tolerance  Patient tolerated treatment well;Patient limited by pain;Patient limited by fatigue pain scale range from 7-8/10       Past Medical History:  Diagnosis Date  . Arthritis    lumbar spine & above- per pt.   . Depression    PTSD  . History of kidney stones    passed spontaneously x1  . PTSD (post-traumatic stress disorder)   . [facility]   . Sleep apnea    severe- on Cpap- q night   . Spondylolisthesis of lumbar region     Past Surgical History:  Procedure Laterality Date  . cyst removed from right shoulder    . HERNIA REPAIR  2014, 2007   [facility]- inguinal - repair- R side   . MANDIBLE SURGERY    . SPLENECTOMY, TOTAL      There were no vitals filed for this visit.  Subjective Assessment - 06/12/17 1306    Subjective  Pt states that he has been doing his exercises;  his right knee is sore     Pertinent History  No prior mobility deficits prior to surgery; pt has been less mobile and has gained about 20 lbs    Patient Stated Goals  Return to normal functional abilities    Currently in Pain?  Yes    Pain Score  9     Pain Location  Knee    Pain Orientation  Right    Pain Descriptors / Indicators  Sore    Pain Type  Acute pain    Pain Onset  More than a month ago                       [facility]/Exercise - 06/12/17 0001      Exercises   Exercises  Lumbar      Lumbar Exercises: Stretches   Passive Hamstring Stretch  2 reps;30 seconds    Single Knee to Chest Stretch  --    Single Knee to Chest Stretch Limitations  --      Lumbar Exercises: Standing   Heel Raises  10 reps    Functional Squats  10 reps    Forward Lunge  10 reps    Scapular Retraction  Strengthening;Both;10 reps;Theraband    Theraband Level (Scapular Retraction)  Level 4 (Blue)    Row  Strengthening;Both;10 reps;Theraband    Theraband Level (Row)  Level 4 (Blue)    Shoulder Extension  Strengthening;Both;10 reps    Theraband Level (Shoulder Extension)  Level 4 (Blue)    Other Standing Lumbar Exercises  hip abduction/extension with blue t-band x 10 @     Other Standing Lumbar Exercises  sidestepping blue t-band at mat x 2 RT ; 1 lap around gym with cane.      Lumbar Exercises: Seated   Sit to Stand  10 reps    Sit to Stand Limitations  2 sets, from mat (20")      Lumbar Exercises: Supine   Ab Set  --    Bent Knee Raise  --    Bent Knee Raise Limitations  --    Dead Bug  --    Dead Bug Limitations  --    Bridge  --    Bridge Limitations  --      Lumbar Exercises: Sidelying   Clam  --    Clam Limitations  --               PT Short Term Goals - 05/29/17 1734      PT SHORT TERM GOAL #1   Title  After 4 weeks patien twill demonstrate ability to tolerate 500+feet AMB at >0.76m/s without increase in back pain.     Status  New      PT SHORT TERM GOAL #2   Title  After 4 weeks patient will demonstrate 5xSTS hands free <20 seconds.     Status  New      PT SHORT TERM GOAL #3   Title  After 2 weeks patient will demonstrate independence in basic HEP to manage pain and improve strength.     Status  New        PT Long Term Goals - 05/29/17 1736      PT LONG TERM GOAL #1   Title  After 8 weeks patient will demonstrate strength 5/5 in  all tested muscle groups in order to reduce pain and assist in return to work.      PT LONG TERM GOAL #2   Title  After 8 weeks patient will demonstrate ability to perform 1500+feet at 1.52m/s or faster without exacerbation of pain.       PT LONG TERM GOAL #3   Title  After 8 weeks patient will demonstrate proper technique for lifting loads of 25lbs 20x without exacerbation of pain to prepare for return to work.     Status  New      PT LONG TERM GOAL #4   Title  After 8 weeks patient will demonstrate 5x STS hands free in less than 14seconds to demonstrate improvement in functional strength.     Status  New            Plan - 06/12/17 1329    Clinical Impression Statement  Pt needed multiple  rest breaks throughout treatment.  Added standing strengthening to pt program as well as gait with cane.    Rehab Potential  Good    Clinical Impairments Affecting Rehab Potential  recent weight gain    PT Frequency  3x / week    PT Duration  4 weeks then 2x/week for 4 weeks    PT Treatment/Interventions  ADLs/Self Care Home Management;Moist Heat;Electrical Stimulation;DME Instruction;Gait training;Stair training;Functional mobility training;Therapeutic exercise;Balance training;Neuromuscular re-education;Patient/family education;Dry needling;Passive range of motion;Scar mobilization    PT Next Visit Plan  Continue with core and proximal strengthening exercises (noted flexion based for pain control this session) and progress as tolerated within pain    PT Home Exercise Plan  SKTC, Heel slides, seated marching, ADIM     Consulted and Agree with Plan of Care  Patient;Family member/caregiver    Family Member Consulted  wife  Patient will benefit from skilled therapeutic intervention in order to improve the following deficits and impairments:  Hypomobility, Decreased activity tolerance, Decreased mobility, Difficulty walking, Postural dysfunction, Improper body mechanics, Decreased strength,  Obesity, Pain  Visit Diagnosis: Acute midline low back pain with right-sided sciatica  Muscle weakness (generalized)  Other abnormalities of gait and mobility  Chronic pain of right knee  Other symptoms and signs involving the musculoskeletal system  Unsteadiness on feet     Problem List Patient Active Problem List   Diagnosis Date Noted  . Spondylolisthesis at L5-S1 level 03/13/2017  . Morbid obesity (HCC) 04/05/2014  . Partial small bowel obstruction (HCC) 04/04/2014  . Leukocytosis 04/04/2014    Virgina Organynthia Jaislyn Blinn, PT CLT [phone] 06/12/2017, 1:45 PM  [facility] [address] , [place], [address] Phone: [phone]   Fax:  [phone]  Name: Sean BenderMichael Blankenship MRN: [id number] Date of Birth: 08/17/1975

## 2017-06-15 ENCOUNTER — Telehealth (HOSPITAL_COMMUNITY): Payer: Self-pay | Admitting: Family Medicine

## 2017-06-15 ENCOUNTER — Ambulatory Visit (HOSPITAL_COMMUNITY): Payer: 59 | Admitting: Physical Therapy

## 2017-06-15 NOTE — Telephone Encounter (Signed)
06/15/17  pt cx said "just getting out of the hospital"... rescheduled to 12/18

## 2017-06-16 ENCOUNTER — Ambulatory Visit (HOSPITAL_COMMUNITY): Payer: 59

## 2017-06-16 ENCOUNTER — Encounter (HOSPITAL_COMMUNITY): Payer: Self-pay

## 2017-06-16 DIAGNOSIS — R2689 Other abnormalities of gait and mobility: Secondary | ICD-10-CM

## 2017-06-16 DIAGNOSIS — M5441 Lumbago with sciatica, right side: Secondary | ICD-10-CM | POA: Diagnosis not present

## 2017-06-16 DIAGNOSIS — M6281 Muscle weakness (generalized): Secondary | ICD-10-CM

## 2017-06-16 NOTE — Therapy (Signed)
[facility] [address] [place], [place], [address] Phone: [phone]   Fax:  [phone]  Physical Therapy Treatment  Patient Details  Name: Sean Murillo MRN: [id number] Date of Birth: 06/07/1976 Referring Provider: Dr. Conchita ParisNundkumar   Encounter Date: 06/16/2017  PT End of Session - 06/16/17 1403    Visit Number  5    Number of Visits  20    Date for PT Re-Evaluation  06/28/17    Authorization Type  UHC     Authorization Time Period  05/29/17-07/29/17    Authorization - Visit Number  5    PT Start Time  1557 pt late for apt    PT Stop Time  1629    PT Time Calculation (min)  32 min    Activity Tolerance  Patient tolerated treatment well;Patient limited by pain;Patient limited by fatigue Pain scale range 7-9/10       Past Medical History:  Diagnosis Date  . Arthritis    lumbar spine & above- per pt.   . Depression    PTSD  . History of kidney stones    passed spontaneously x1  . PTSD (post-traumatic stress disorder)   . [facility]   . Sleep apnea    severe- on Cpap- q night   . Spondylolisthesis of lumbar region     Past Surgical History:  Procedure Laterality Date  . cyst removed from right shoulder    . [facility]IA REPA[facility]  2014, 2007   [facility]- inguinal - repair- R side   . MANDIBLE SURGERY    . SPLENECTOMY, TOTAL      There were no vitals filed for this visit.  Subjective Assessment - 06/16/17 1401    Subjective  Pt arrived ambulating with SPC.  reports high pain scale on lt side of lower back, pain scale 7-8/10.    Pertinent History  No prior mobility deficits prior to surgery; pt has been less mobile and has gained about 20 lbs    Patient Stated Goals  Return to normal functional abilities    Currently in Pain?  Yes    Pain Score  8     Pain Location  Back    Pain Orientation  Lower;Left    Pain Descriptors / Indicators  Sharp;Sore    Pain Type  Acute pain    Pain Onset  More  than a month ago    Pain Frequency  Intermittent    Aggravating Factors   being mobile    Pain Relieving Factors  resting    Effect of Pain on Daily Activities  increases                      [facility] Adult PT Treatment/Exercise - 06/16/17 0001      Lumbar Exercises: Standing   Heel Raises  10 reps    Functional Squats  10 reps    Forward Lunge  15 reps    Wall Slides  10 reps;3 seconds reports less pain then squats    Scapular Retraction  Strengthening;Both;Theraband;15 reps    Theraband Level (Scapular Retraction)  Level 4 (Blue)    Row  Strengthening;Both;Theraband;15 reps    Theraband Level (Row)  Level 4 (Blue)    Shoulder Extension  Strengthening;Both;15 reps;Theraband    Theraband Level (Shoulder Extension)  Level 4 (Blue)    Other Standing Lumbar Exercises  standing ab set 5x 5"    Other Standing  Lumbar Exercises  SLS 3 attempts 16" max BLE      Lumbar Exercises: Seated   Sit to Stand  10 reps    Sit to Stand Limitations  from mat (20")               PT Short Term Goals - 05/29/17 1734      PT SHORT TERM GOAL #1   Title  After 4 weeks patien twill demonstrate ability to tolerate 500+feet AMB at >0.4012m/s without increase in back pain.     Status  New      PT SHORT TERM GOAL #2   Title  After 4 weeks patient will demonstrate 5xSTS hands free <20 seconds.     Status  New      PT SHORT TERM GOAL #3   Title  After 2 weeks patient will demonstrate independence in basic HEP to manage pain and improve strength.     Status  New        PT Long Term Goals - 05/29/17 1736      PT LONG TERM GOAL #1   Title  After 8 weeks patient will demonstrate strength 5/5 in all tested muscle groups in order to reduce pain and assist in return to work.      PT LONG TERM GOAL #2   Title  After 8 weeks patient will demonstrate ability to perform 1500+feet at 1.5766m/s or faster without exacerbation of pain.       PT LONG TERM GOAL #3   Title  After 8 weeks patient  will demonstrate proper technique for lifting loads of 25lbs 20x without exacerbation of pain to prepare for return to work.     Status  New      PT LONG TERM GOAL #4   Title  After 8 weeks patient will demonstrate 5x STS hands free in less than 14seconds to demonstrate improvement in functional strength.     Status  New            Plan - 06/16/17 1432    Clinical Impression Statement  Progressed core and proximal strengthneing with CKC activities.  Pt required cueing to impropve abdominal activation with functional strengthening exercises for core strengthening and pain control.  Added theraband postural exercises to HEP, pt able to demonstrate appropriate form with all motions.      Rehab Potential  Good    Clinical Impairments Affecting Rehab Potential  recent weight gain    PT Frequency  3x / week    PT Duration  4 weeks then 2/week for 4 weeks    PT Treatment/Interventions  ADLs/Self Care Home Management;Moist Heat;Electrical Stimulation;DME Instruction;Gait training;Stair training;Functional mobility training;Therapeutic exercise;Balance training;Neuromuscular re-education;Patient/family education;Dry needling;Passive range of motion;Scar mobilization    PT Next Visit Plan  Continue with core and proximal strengthening exercises (noted flexion based for pain control this session) and progress as tolerated within pain.  Add vector stance next session.      PT Home Exercise Plan  SKTC, Heel slides, seated marching, ADIM; GTB posture strengthening    Consulted and Agree with Plan of Care  Patient;Family member/caregiver    Family Member Consulted  wife       Patient will benefit from skilled therapeutic intervention in order to improve the following deficits and impairments:  Hypomobility, Decreased activity tolerance, Decreased mobility, Difficulty walking, Postural dysfunction, Improper body mechanics, Decreased strength, Obesity, Pain  Visit Diagnosis: Acute midline low back pain  with right-sided sciatica  Muscle weakness (  generalized)  Other abnormalities of gait and mobility     Problem List Patient Active Problem List   Diagnosis Date Noted  . Spondylolisthesis at L5-S1 level 03/13/2017  . Morbid obesity (HCC) 04/05/2014  . Partial small bowel obstruction (HCC) 04/04/2014  . Leukocytosis 04/04/2014   Becky Sax, LPTA; CBIS [phone]  Juel Burrow 06/16/2017, 3:44 PM  [facility] [address] [place], [place], [address] Phone: [phone]   Fax:  [phone]  Name: Sean Murillo MRN: [id number] Date of Birth: 10/21/1975

## 2017-06-17 ENCOUNTER — Ambulatory Visit (HOSPITAL_COMMUNITY): Payer: 59

## 2017-06-17 ENCOUNTER — Telehealth (HOSPITAL_COMMUNITY): Payer: Self-pay | Admitting: Family Medicine

## 2017-06-17 NOTE — Telephone Encounter (Signed)
06/17/17  wife called to cx said that he has been up all night sick

## 2017-06-19 ENCOUNTER — Encounter (HOSPITAL_COMMUNITY): Payer: Self-pay

## 2017-06-19 ENCOUNTER — Ambulatory Visit (HOSPITAL_COMMUNITY): Payer: 59

## 2017-06-19 DIAGNOSIS — M5441 Lumbago with sciatica, right side: Secondary | ICD-10-CM | POA: Diagnosis not present

## 2017-06-19 DIAGNOSIS — M6281 Muscle weakness (generalized): Secondary | ICD-10-CM

## 2017-06-19 DIAGNOSIS — R2689 Other abnormalities of gait and mobility: Secondary | ICD-10-CM

## 2017-06-19 NOTE — Therapy (Signed)
[facility] [address] [place], [place], [address] Phone: [phone]   Fax:  [phone]  Physical Therapy Treatment  Patient Details  Name: Sean Murillo MRN: [id number] Date of Birth: 07-May-1976 Referring Provider: Dr. Conchita Paris   Encounter Date: 06/19/2017  PT End of Session - 06/19/17 1307    Visit Number  6    Number of Visits  20    Date for PT Re-Evaluation  06/28/17    Authorization Type  UHC     Authorization Time Period  05/29/17-07/29/17    PT Start Time  1302    PT Stop Time  1348    PT Time Calculation (min)  46 min    Activity Tolerance  Patient tolerated treatment well;Patient limited by pain;Patient limited by fatigue       Past Medical History:  Diagnosis Date  . Arthritis    lumbar spine & above- per pt.   . Depression    PTSD  . History of kidney stones    passed spontaneously x1  . PTSD (post-traumatic stress disorder)   . [place] spotted fever   . Sleep apnea    severe- on Cpap- q night   . Spondylolisthesis of lumbar region     Past Surgical History:  Procedure Laterality Date  . cyst removed from right shoulder    . HERNIA REPAIR  2014, 2007   [facility] x2, also in 2001- inguinal - repair- R side   . MANDIBLE SURGERY    . SPLENECTOMY, TOTAL      There were no vitals filed for this visit.  Subjective Assessment - 06/19/17 1301    Subjective  Pt stated he has been sick last 2 days, continues to have sinus issues.  Reports high pain scale lower back 8/10.    Pertinent History  No prior mobility deficits prior to surgery; pt has been less mobile and has gained about 20 lbs    Patient Stated Goals  Return to normal functional abilities    Currently in Pain?  Yes    Pain Score  8     Pain Location  Back    Pain Orientation  Lower    Pain Descriptors / Indicators  Sharp    Pain Type  Acute pain    Pain Onset  More than a month ago    Pain Frequency  Intermittent    Aggravating Factors    being mobile    Pain Relieving Factors  resting    Effect of Pain on Daily Activities  increases                      [facility] Adult PT Treatment/Exercise - 06/19/17 0001      Lumbar Exercises: Standing   Functional Squats  10 reps    Forward Lunge  15 reps with ab set    Scapular Retraction  Strengthening;Both;Theraband;15 reps Reviewed form to assure proper form/technqiue with HEP    Theraband Level (Scapular Retraction)  Level 4 (Blue)    Row  Strengthening;Both;Theraband;15 reps Reviewed form to assure proper form/technqiue with HEP    Theraband Level (Row)  Level 4 (Blue)    Shoulder Extension  Strengthening;Both;15 reps;Theraband Reviewed form to assure proper form/technqiue with HEP    Theraband Level (Shoulder Extension)  Level 4 (Blue)      Lumbar Exercises: Seated   Sit to Stand  10 reps eccenctric control STS no HHA      Lumbar  Exercises: Supine   Clam  10 reps;5 seconds RTB and cueing for stability with task    Bent Knee Raise  15 reps;3 seconds    Bent Knee Raise Limitations  with ab set      Modalities   Modalities  Moist Heat      Moist Heat Therapy   Number Minutes Moist Heat  8 Minutes    Moist Heat Location  Other (comment) Lower back for pain control               PT Short Term Goals - 05/29/17 1734      PT SHORT TERM GOAL #1   Title  After 4 weeks patien twill demonstrate ability to tolerate 500+feet AMB at >0.455m/s without increase in back pain.     Status  New      PT SHORT TERM GOAL #2   Title  After 4 weeks patient will demonstrate 5xSTS hands free <20 seconds.     Status  New      PT SHORT TERM GOAL #3   Title  After 2 weeks patient will demonstrate independence in basic HEP to manage pain and improve strength.     Status  New        PT Long Term Goals - 05/29/17 1736      PT LONG TERM GOAL #1   Title  After 8 weeks patient will demonstrate strength 5/5 in all tested muscle groups in order to reduce pain and assist  in return to work.      PT LONG TERM GOAL #2   Title  After 8 weeks patient will demonstrate ability to perform 1500+feet at 1.6780m/s or faster without exacerbation of pain.       PT LONG TERM GOAL #3   Title  After 8 weeks patient will demonstrate proper technique for lifting loads of 25lbs 20x without exacerbation of pain to prepare for return to work.     Status  New      PT LONG TERM GOAL #4   Title  After 8 weeks patient will demonstrate 5x STS hands free in less than 14seconds to demonstrate improvement in functional strength.     Status  New            Plan - 06/19/17 1322    Clinical Impression Statement  Pt limited by pain and reports of sickness the last 2 days so decreased activity tolerance.  Session focus on core and proximal strengthening to assist wiht lumbar support.  Began session with MHP during seated abdominal sets.  Pt with good form though does require cueing for abdominal contraction prior activity.  Held new balance activities due to pain.  Reviewed goals and encouraged pt. to begin walking program to assist with activity tolerance.  EOS pt reports Rt side of lower back pain resolved, does continue to have some pain on Lt.      Rehab Potential  Good    Clinical Impairments Affecting Rehab Potential  recent weight gain    PT Frequency  3x / week    PT Duration  4 weeks    PT Treatment/Interventions  ADLs/Self Care Home Management;Moist Heat;Electrical Stimulation;DME Instruction;Gait training;Stair training;Functional mobility training;Therapeutic exercise;Balance training;Neuromuscular re-education;Patient/family education;Dry needling;Passive range of motion;Scar mobilization    PT Next Visit Plan  Continue with core and proximal strengthening exercises (noted flexion based for pain control this session) and progress as tolerated within pain.  Add vector stance next session.  PT Home Exercise Plan  SKTC, Heel slides, seated marching, ADIM; GTB posture  strengthening; begin walking program       Patient will benefit from skilled therapeutic intervention in order to improve the following deficits and impairments:  Hypomobility, Decreased activity tolerance, Decreased mobility, Difficulty walking, Postural dysfunction, Improper body mechanics, Decreased strength, Obesity, Pain  Visit Diagnosis: Acute midline low back pain with right-sided sciatica  Muscle weakness (generalized)  Other abnormalities of gait and mobility     Problem List Patient Active Problem List   Diagnosis Date Noted  . Spondylolisthesis at L5-S1 level 03/13/2017  . Morbid obesity (HCC) 04/05/2014  . Partial small bowel obstruction (HCC) 04/04/2014  . Leukocytosis 04/04/2014   Becky Saxasey Jb Dulworth, LPTA; CBIS [phone]  Juel BurrowCockerham, Ivin Rosenbloom Jo 06/19/2017, 1:51 PM  [facility] [address] [place], [place], [address] Phone: [phone]   Fax:  [phone]  Name: Sean Murillo MRN: [id number] Date of Birth: 01/05/1976

## 2017-06-22 ENCOUNTER — Telehealth (HOSPITAL_COMMUNITY): Payer: Self-pay | Admitting: Family Medicine

## 2017-06-22 ENCOUNTER — Telehealth (HOSPITAL_COMMUNITY): Payer: Self-pay | Admitting: Physical Therapy

## 2017-06-22 ENCOUNTER — Ambulatory Visit (HOSPITAL_COMMUNITY): Payer: 59 | Admitting: Physical Therapy

## 2017-06-22 NOTE — Telephone Encounter (Signed)
Called pt re: missed appointment this morning.  Pt informed of his next appointment on 12/26 at 1300.  Virgina Organynthia Russell, PT CLT [phone]

## 2017-06-22 NOTE — Telephone Encounter (Signed)
06/22/17  Pt called at 9:06 and said he overslept and that is why he wasn't here for his 8:15 appt.

## 2017-06-24 ENCOUNTER — Ambulatory Visit (HOSPITAL_COMMUNITY): Payer: 59

## 2017-06-24 ENCOUNTER — Encounter (HOSPITAL_COMMUNITY): Payer: Self-pay

## 2017-06-24 DIAGNOSIS — M5441 Lumbago with sciatica, right side: Secondary | ICD-10-CM

## 2017-06-24 DIAGNOSIS — M6281 Muscle weakness (generalized): Secondary | ICD-10-CM

## 2017-06-24 DIAGNOSIS — R2689 Other abnormalities of gait and mobility: Secondary | ICD-10-CM

## 2017-06-24 NOTE — Therapy (Signed)
[facility] [address] [place], [place], [address] Phone: [phone]   Fax:  [phone]  Physical Therapy Treatment  Patient Details  Name: Sean Murillo MRN: [id number] Date of Birth: 01/17/1976 Referring Provider: Dr. Conchita ParisNundkumar   Encounter Date: 06/24/2017  PT End of Session - 06/24/17 1307    Visit Number  7    Number of Visits  20    Date for PT Re-Evaluation  06/28/17    Authorization Type  UHC     Authorization Time Period  05/29/17-07/29/17    Authorization - Visit Number  7    PT Start Time  1300    PT Stop Time  1346    PT Time Calculation (min)  46 min    Activity Tolerance  Patient tolerated treatment well;No increased pain;Patient limited by fatigue    Behavior During Therapy  [facility] for tasks assessed/performed       Past Medical History:  Diagnosis Date  . Arthritis    lumbar spine & above- per pt.   . Depression    PTSD  . History of kidney stones    passed spontaneously x1  . PTSD (post-traumatic stress disorder)   [facility]   . Sleep apnea    severe- on Cpap- q night   . Spondylolisthesis of lumbar region     Past Surgical History:  Procedure Laterality Date  . cyst removed from right shoulder    . HERNIA REPAIR  2014, 2007   [facility]- inguinal - repair- R side   . MANDIBLE SURGERY    . SPLENECTOMY, TOTAL      There were no vitals filed for this visit.  Subjective Assessment - 06/24/17 1257    Subjective  Pt stated he did too much yesterday, pain scale LBP 6/10.  Has increased to 5 minute walking program prior fatigue, around 3x a day.    Pertinent History  No prior mobility deficits prior to surgery; pt has been less mobile and has gained about 20 lbs    Patient Stated Goals  Return to normal functional abilities    Currently in Pain?  Yes    Pain Score  6     Pain Location  Back    Pain Orientation  Lower;Left    Pain Descriptors / Indicators   Aching;Sore    Pain Type  Acute pain    Pain Onset  More than a month ago    Pain Frequency  Intermittent    Aggravating Factors   being mobile    Pain Relieving Factors  resting    Effect of Pain on Daily Activities  increases         [facility] PT Assessment - 06/24/17 0001      Assessment   Medical Diagnosis  s/p Right L5/S1    Referring Provider  Dr. Conchita ParisNundkumar    Onset Date/Surgical Date  -- March 13, 2017    Hand Dominance  Right    Next MD Visit  Monday 07/02/2016    Prior Therapy  None                  [facility] Adult PT Treatment/Exercise - 06/24/17 0001      Lumbar Exercises: Standing   Forward Lunge  15 reps floor    Wall Slides  15 reps;5 seconds    Other Standing Lumbar Exercises  SLS Rt 16", Lt 26"; vector stance (for extenion LE neutral  with glut set)    Other Standing Lumbar Exercises  Pavlo tandem stance on foam 10x each side      Lumbar Exercises: Seated   Sit to Stand  10 reps no HHA 5 STS in 19"    Sit to Stand Limitations  from mat (20")      Lumbar Exercises: Supine   Bent Knee Raise  15 reps;3 seconds    Bent Knee Raise Limitations  with ab set with MHP      Lumbar Exercises: Sidelying   Clam  15 reps    Clam Limitations  RTB      Moist Heat Therapy   Number Minutes Moist Heat  8 Minutes    Moist Heat Location  -- During supine therex               PT Short Term Goals - 05/29/17 1734      PT SHORT TERM GOAL #1   Title  After 4 weeks patien twill demonstrate ability to tolerate 500+feet AMB at >0.64m/s without increase in back pain.     Status  New      PT SHORT TERM GOAL #2   Title  After 4 weeks patient will demonstrate 5xSTS hands free <20 seconds.     Status  New      PT SHORT TERM GOAL #3   Title  After 2 weeks patient will demonstrate independence in basic HEP to manage pain and improve strength.     Status  New        PT Long Term Goals - 05/29/17 1736      PT LONG TERM GOAL #1   Title  After 8 weeks patient  will demonstrate strength 5/5 in all tested muscle groups in order to reduce pain and assist in return to work.      PT LONG TERM GOAL #2   Title  After 8 weeks patient will demonstrate ability to perform 1500+feet at 1.29m/s or faster without exacerbation of pain.       PT LONG TERM GOAL #3   Title  After 8 weeks patient will demonstrate proper technique for lifting loads of 25lbs 20x without exacerbation of pain to prepare for return to work.     Status  New      PT LONG TERM GOAL #4   Title  After 8 weeks patient will demonstrate 5x STS hands free in less than 14seconds to demonstrate improvement in functional strength.     Status  New            Plan - 06/24/17 1349    Clinical Impression Statement  Continued session focus with core/proximal strengthening and hip stability.  Progressed therex with vector stance/SLS activities and dynamic surfaces during core stability exercises.  Therapist facilitation for abdominal sets during therex for lumbar support.  Used MHP during supine therex for pain control.  Additional exercises given for hip strengthening with ability to demonstrate and verbalize.      Rehab Potential  Good    Clinical Impairments Affecting Rehab Potential  recent weight gain    PT Frequency  3x / week    PT Duration  4 weeks    PT Treatment/Interventions  ADLs/Self Care Home Management;Moist Heat;Electrical Stimulation;DME Instruction;Gait training;Stair training;Functional mobility training;Therapeutic exercise;Balance training;Neuromuscular re-education;Patient/family education;Dry needling;Passive range of motion;Scar mobilization    PT Next Visit Plan  Continue with core and proximal strengthening exercises (noted flexion based for pain control this session) and progress as  tolerated within pain.      PT Home Exercise Plan  SKTC, Heel slides, seated marching, ADIM; GTB posture strengthening; begin walking program; side step and clam with RTB       Patient will  benefit from skilled therapeutic intervention in order to improve the following deficits and impairments:  Hypomobility, Decreased activity tolerance, Decreased mobility, Difficulty walking, Postural dysfunction, Improper body mechanics, Decreased strength, Obesity, Pain  Visit Diagnosis: Acute midline low back pain with right-sided sciatica  Muscle weakness (generalized)  Other abnormalities of gait and mobility     Problem List Patient Active Problem List   Diagnosis Date Noted  . Spondylolisthesis at L5-S1 level 03/13/2017  . Morbid obesity (HCC) 04/05/2014  . Partial small bowel obstruction (HCC) 04/04/2014  . Leukocytosis 04/04/2014   Becky Saxasey Araeya Lamb, LPTA; CBIS [phone]  Juel BurrowCockerham, Nazli Penn Jo 06/24/2017, 2:04 PM   [facility] [address] [place] [place], [place], [address] Phone: [phone]   Fax:  [phone]  Name: Sean Murillo MRN: [id number] Date of Birth: 07/23/1975

## 2017-06-24 NOTE — Patient Instructions (Addendum)
Clam: Supine and sidelying  Band Walk: Side Stepping    Tie band around legs, just above knees. Step 10-15 feet to one side, then step back to start. Repeat 10-15  feet per session. Note: Small towel between band and skin eases rubbing.  http://plyo.exer.us/76   Copyright  VHI. All rights reserved.

## 2017-06-26 ENCOUNTER — Encounter (HOSPITAL_COMMUNITY): Payer: Self-pay

## 2017-06-26 ENCOUNTER — Ambulatory Visit (HOSPITAL_COMMUNITY): Payer: 59

## 2017-06-26 DIAGNOSIS — M5441 Lumbago with sciatica, right side: Secondary | ICD-10-CM

## 2017-06-26 DIAGNOSIS — M6281 Muscle weakness (generalized): Secondary | ICD-10-CM

## 2017-06-26 DIAGNOSIS — R2689 Other abnormalities of gait and mobility: Secondary | ICD-10-CM

## 2017-06-26 NOTE — Therapy (Signed)
[facility] [address] [place], [place], [address] Phone: [phone]   Fax:  [phone]  Physical Therapy Treatment  Patient Details  Name: Sean Murillo MRN: [id number] Date of Birth: 04/14/1976 Referring Provider: Dr. Conchita ParisNundkumar   Encounter Date: 06/26/2017  PT End of Session - 06/26/17 1312    Visit Number  8    Number of Visits  20    Date for PT Re-Evaluation  06/28/17    Authorization Type  UHC     Authorization Time Period  05/29/17-07/29/17    Authorization - Visit Number  8    PT Start Time  1301    PT Stop Time  1347    PT Time Calculation (min)  46 min    Activity Tolerance  Patient limited by pain;Patient tolerated treatment well;Patient limited by fatigue    Behavior During Therapy  [facility] for tasks assessed/performed       Past Medical History:  Diagnosis Date  . Arthritis    lumbar spine & above- per pt.   . Depression    PTSD  . History of kidney stones    passed spontaneously x1  . PTSD (post-traumatic stress disorder)   . [facility]   . Sleep apnea    severe- on Cpap- q night   . Spondylolisthesis of lumbar region     Past Surgical History:  Procedure Laterality Date  . cyst removed from right shoulder    . HERNIA REPA[facility]  2014, 2007   [facility]- inguinal - repair- R side   . MANDIBLE SURGERY    . SPLENECTOMY, TOTAL      There were no vitals filed for this visit.  Subjective Assessment - 06/26/17 1310    Subjective  Pt stated he LBP 5/10.  Reports compliance with HEP and walking program daily    Pertinent History  No prior mobility deficits prior to surgery; pt has been less mobile and has gained about 20 lbs    Patient Stated Goals  Return to normal functional abilities    Currently in Pain?  Yes    Pain Score  5     Pain Location  Back    Pain Orientation  Lower;Left    Pain Descriptors / Indicators  Aching;Sore    Pain Type  Acute pain    Pain Onset  More  than a month ago    Pain Frequency  Intermittent    Aggravating Factors   being mobile    Pain Relieving Factors  resting    Effect of Pain on Daily Activities  increases                      [facility] Adult PT Treatment/Exercise - 06/26/17 0001      Lumbar Exercises: Standing   Functional Squats  10 reps    Forward Lunge  15 reps floor with ab set    Other Standing Lumbar Exercises  Marching 10x 5" no HHA; SLS Rt 15", Lt 26"; vector stance with glut squeeze for extension    Other Standing Lumbar Exercises  Pavlo tandem stance with RTB on foam 15x each side; sidestep with RTB 2 RT; Step up BLE 6in 10 each      Lumbar Exercises: Seated   Sit to Stand  10 reps    Sit to Stand Limitations  from mat (21.06")      Lumbar Exercises: Supine   Bent Knee  Raise  15 reps;3 seconds    Bent Knee Raise Limitations  with theraband UE extension and MHP      Lumbar Exercises: Sidelying   Clam  15 reps    Clam Limitations  RTB               PT Short Term Goals - 05/29/17 1734      PT SHORT TERM GOAL #1   Title  After 4 weeks patien twill demonstrate ability to tolerate 500+feet AMB at >0.36m/s without increase in back pain.     Status  New      PT SHORT TERM GOAL #2   Title  After 4 weeks patient will demonstrate 5xSTS hands free <20 seconds.     Status  New      PT SHORT TERM GOAL #3   Title  After 2 weeks patient will demonstrate independence in basic HEP to manage pain and improve strength.     Status  New        PT Long Term Goals - 05/29/17 1736      PT LONG TERM GOAL #1   Title  After 8 weeks patient will demonstrate strength 5/5 in all tested muscle groups in order to reduce pain and assist in return to work.      PT LONG TERM GOAL #2   Title  After 8 weeks patient will demonstrate ability to perform 1500+feet at 1.92m/s or faster without exacerbation of pain.       PT LONG TERM GOAL #3   Title  After 8 weeks patient will demonstrate proper technique for  lifting loads of 25lbs 20x without exacerbation of pain to prepare for return to work.     Status  New      PT LONG TERM GOAL #4   Title  After 8 weeks patient will demonstrate 5x STS hands free in less than 14seconds to demonstrate improvement in functional strength.     Status  New            Plan - 06/26/17 1642    Clinical Impression Statement  Continued session focus with core/proximal strengthening for lumbar support and functional strengthening.  Began step up training for functional strengthening, pt able to complete with 1 HR assistance for stability, did reports increased pain with Rt LE step ups and tandem stance with increased weight bearing Rt LE.      Rehab Potential  Good    Clinical Impairments Affecting Rehab Potential  recent weight gain    PT Frequency  3x / week    PT Duration  4 weeks    PT Treatment/Interventions  ADLs/Self Care Home Management;Moist Heat;Electrical Stimulation;DME Instruction;Gait training;Stair training;Functional mobility training;Therapeutic exercise;Balance training;Neuromuscular re-education;Patient/family education;Dry needling;Passive range of motion;Scar mobilization    PT Next Visit Plan  Continue with core and proximal strengthening exercises (noted flexion based for pain control this session) and progress as tolerated within pain.      PT Home Exercise Plan  SKTC, Heel slides, seated marching, ADIM; GTB posture strengthening; begin walking program; side step and clam with RTB       Patient will benefit from skilled therapeutic intervention in order to improve the following deficits and impairments:  Hypomobility, Decreased activity tolerance, Decreased mobility, Difficulty walking, Postural dysfunction, Improper body mechanics, Decreased strength, Obesity, Pain  Visit Diagnosis: Acute midline low back pain with right-sided sciatica  Muscle weakness (generalized)  Other abnormalities of gait and mobility     Problem List  Patient  Active Problem List   Diagnosis Date Noted  . Spondylolisthesis at L5-S1 level 03/13/2017  . Morbid obesity (HCC) 04/05/2014  . Partial small bowel obstruction (HCC) 04/04/2014  . Leukocytosis 04/04/2014   Becky Saxasey Mckaylie Vasey, LPTA; CBIS [phone]  Juel BurrowCockerham, Damoni Causby Jo 06/26/2017, 4:45 PM  [facility] [address] [place], [place], [address] Phone: [phone]   Fax:  [phone]  Name: Sean Murillo MRN: [id number] Date of Birth: 06/18/1976

## 2017-06-29 ENCOUNTER — Ambulatory Visit (HOSPITAL_COMMUNITY): Payer: 59 | Admitting: Physical Therapy

## 2017-07-01 ENCOUNTER — Ambulatory Visit (HOSPITAL_COMMUNITY): Payer: 59 | Attending: Neurosurgery | Admitting: Physical Therapy

## 2017-07-01 ENCOUNTER — Encounter (HOSPITAL_COMMUNITY): Payer: Self-pay | Admitting: Physical Therapy

## 2017-07-01 DIAGNOSIS — M6281 Muscle weakness (generalized): Secondary | ICD-10-CM | POA: Insufficient documentation

## 2017-07-01 DIAGNOSIS — R2689 Other abnormalities of gait and mobility: Secondary | ICD-10-CM

## 2017-07-01 DIAGNOSIS — M5441 Lumbago with sciatica, right side: Secondary | ICD-10-CM | POA: Diagnosis not present

## 2017-07-01 NOTE — Therapy (Signed)
[facility] [address] [place], [place], [address] Phone: [phone]   Fax:  [phone]  Physical Therapy Treatment  Patient Details  Name: Sean Murillo MRN: [id number] Date of Birth: 02/16/1976 Referring Provider: Dr. Dimas Millin    Encounter Date: 07/01/2017  PT End of Session - 07/01/17 1301    Visit Number  9    Number of Visits  19    Date for PT Re-Evaluation  06/28/17    Authorization Type  [employer]     Authorization Time Period  05/29/17-07/29/17    Authorization - Visit Number  9    Authorization - Number of Visits  19    PT Start Time  1305    PT Stop Time  1345    PT Time Calculation (min)  40 min    Activity Tolerance  Patient limited by pain;Patient tolerated treatment well;Patient limited by fatigue    Behavior During Therapy  [facility] for tasks assessed/performed       Past Medical History:  Diagnosis Date  . Arthritis    lumbar spine & above- per pt.   . Depression    PTSD  . History of kidney stones    passed spontaneously x1  . PTSD (post-traumatic stress disorder)   . [facility] spotted fever   . Sleep apnea    severe- on Cpap- q night   . Spondylolisthesis of lumbar region     Past Surgical History:  Procedure Laterality Date  . cyst removed from right shoulder    . HERNIA REPAIR  2014, 2007   [facility] x2, also in 2001- inguinal - repair- R side   . MANDIBLE SURGERY    . SPLENECTOMY, TOTAL      There were no vitals filed for this visit.      [facility] PT Assessment - 07/01/17 0001      Assessment   Medical Diagnosis  s/p Right L5/S1    Referring Provider  Dr. 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, walking, standing, bending twisting, llifting      Observation/Other Assessments   Focus on Therapeutic Outcomes (FOTO)   38 was 10      Sensation   Light Touch  Impaired Detail    Light Touch Impaired Details  Impaired RLE >75% loss of L5/S1 dermatome      Strength   Right Hip Flexion  4+/5 was 4+/5 on 11/30    Right Hip External Rotation   4/5 was 3+    Right Hip Internal Rotation  4+/5 was 3+/5    Right Hip ABduction  -- horizontal abduction: 5/5    Right Hip ADduction  5/5  Left Hip Flexion  5/5    Left Hip External Rotation  5/5    Left Hip Internal Rotation  5/5    Left Hip ABduction  -- horizontal abduction: 5/5    Left Hip ADduction  5/5    Right Knee Flexion  4/5    Right Knee Extension  4+/5 was 4+/5    Left Knee Flexion  5/5    Left Knee Extension  5/5    Right Ankle Dorsiflexion  5/5    Right Ankle Plantar Flexion  5/5 seated soleus     Left Ankle Dorsiflexion  5/5    Left Ankle Plantar Flexion  5/5 seated soleus       Transfers   Five time sit to stand comments   5 x 22.26 was 4 in 25 seconds  was 4x in 25 seconds      Ambulation/Gait   Ambulation Distance (Feet)  452 Feet    Assistive device  Straight cane was 225 with a rollator.                   [facility] Adult PT Treatment/Exercise - 07/01/17 0001      Exercises   Exercises  Lumbar      Lumbar Exercises: Standing   Heel Raises  15 reps    Functional Squats  15 reps    Forward Lunge  15 reps    Other Standing Lumbar Exercises  SLS x 2 B max 56 on Lt ; 35 on RT     Other Standing Lumbar Exercises  side step with red tband down back hall; forward and lateral step ups 4" x 10 B       Lumbar Exercises: Seated   Sit  to Stand  5 reps               PT Short Term Goals - 07/01/17 1322      PT SHORT TERM GOAL #1   Title  After 4 weeks patien twill demonstrate ability to tolerate 500+feet AMB at >0.15m/s without increase in back pain.     Status  On-going      PT SHORT TERM GOAL #2   Title  After 4 weeks patient will demonstrate 5xSTS hands free <20 seconds.     Status  Achieved      PT SHORT TERM GOAL #3   Title  After 2 weeks patient will demonstrate independence in basic HEP to manage pain and improve strength.     Status  Achieved        PT Long Term Goals - 07/01/17 1326      PT LONG TERM GOAL #1   Title  After 8 weeks patient will demonstrate strength 5/5 in all tested muscle groups in order to reduce pain and assist in return to work.    Status  On-going      PT LONG TERM GOAL #2   Title  After 8 weeks patient will demonstrate ability to perform 1500+feet at 1.109m/s or faster without exacerbation of pain.     Status  On-going      PT LONG TERM GOAL #3   Title  After 8 weeks patient will demonstrate proper technique for lifting loads of 25lbs 20x without exacerbation of pain to prepare for return to work.     Status  On-going      PT LONG TERM GOAL #4   Title  After 8 weeks patient will demonstrate 5x STS hands free in  less than 14seconds to demonstrate improvement in functional strength.     Status  On-going            Plan - 07/01/17 1351    Clinical Impression Statement  Pt reassessed with noted improvement globally.  PT continues to have his wife assist his dressing although he should be able to complete this task himself.  Therapist and pt spoke about the need to be dressing I ; pt agrees.  Pt states that he is still unable to wipe himself after a bowel movement and has not tried to walk even short distances without his cane.  Therapist and pt spoke about walking short distances inside the house without his cane.  Pt will continue to benefit from skilled physical  therapy to address functional limitation.s     Rehab Potential  Good    Clinical Impairments Affecting Rehab Potential  recent weight gain    PT Frequency  3x / week    PT Duration  4 weeks    PT Treatment/Interventions  ADLs/Self Care Home Management;Moist Heat;Electrical Stimulation;DME Instruction;Gait training;Stair training;Functional mobility training;Therapeutic exercise;Balance training;Neuromuscular re-education;Patient/family education;Dry needling;Passive range of motion;Scar mobilization    PT Next Visit Plan  Continue with functional strengthening; begin ER hip stretches sitting to address putting own shoes and socks on.  Begin walking without an assistive device.  Sitting with forward hip flexion (shoulders over knees) with Rt UE to simulate wiping self after a bowel motion.      PT Home Exercise Plan  SKTC, Heel slides, seated marching, ADIM; GTB posture strengthening; begin walking program; side step and clam with RTB       Patient will benefit from skilled therapeutic intervention in order to improve the following deficits and impairments:  Hypomobility, Decreased activity tolerance, Decreased mobility, Difficulty walking, Postural dysfunction, Improper body mechanics, Decreased strength, Obesity, Pain  Visit Diagnosis: Acute midline low back pain with right-sided sciatica  Muscle weakness (generalized)  Other abnormalities of gait and mobility     Problem List Patient Active Problem List   Diagnosis Date Noted  . Spondylolisthesis at L5-S1 level 03/13/2017  . Morbid obesity (HCC) 04/05/2014  . Partial small bowel obstruction (HCC) 04/04/2014  . Leukocytosis 04/04/2014    Virgina Organynthia Sasan Wilkie, PT CLT [phone] 07/01/2017, 2:05 PM  [facility] [address] [place], [place], [address] Phone: [phone]   Fax:  [phone]  Name: Gwenyth BenderMichael Murillo MRN: [id number] Date of Birth: 02/15/1976

## 2017-07-02 ENCOUNTER — Telehealth (HOSPITAL_COMMUNITY): Payer: Self-pay | Admitting: Family Medicine

## 2017-07-02 NOTE — Telephone Encounter (Signed)
07/02/17  has another appt same day and time... RS for 1/10

## 2017-07-06 ENCOUNTER — Encounter (HOSPITAL_COMMUNITY): Payer: 59

## 2017-07-08 ENCOUNTER — Telehealth (HOSPITAL_COMMUNITY): Payer: Self-pay | Admitting: Family Medicine

## 2017-07-08 ENCOUNTER — Ambulatory Visit (HOSPITAL_COMMUNITY): Payer: 59

## 2017-07-08 NOTE — Telephone Encounter (Signed)
07/08/17  pt said to go ahead and cx this week's appts and add to end of his schedule- had a death in the family

## 2017-07-09 ENCOUNTER — Ambulatory Visit (HOSPITAL_COMMUNITY): Payer: 59

## 2017-07-12 DIAGNOSIS — M79675 Pain in left toe(s): Secondary | ICD-10-CM | POA: Diagnosis present

## 2017-07-12 DIAGNOSIS — Z79899 Other long term (current) drug therapy: Secondary | ICD-10-CM | POA: Insufficient documentation

## 2017-07-12 DIAGNOSIS — M19072 Primary osteoarthritis, left ankle and foot: Secondary | ICD-10-CM | POA: Insufficient documentation

## 2017-07-12 DIAGNOSIS — F17220 Nicotine dependence, chewing tobacco, uncomplicated: Secondary | ICD-10-CM | POA: Insufficient documentation

## 2017-07-13 ENCOUNTER — Encounter (HOSPITAL_COMMUNITY): Payer: Self-pay | Admitting: *Deleted

## 2017-07-13 ENCOUNTER — Ambulatory Visit (HOSPITAL_COMMUNITY): Payer: 59 | Admitting: Physical Therapy

## 2017-07-13 ENCOUNTER — Other Ambulatory Visit: Payer: Self-pay

## 2017-07-13 ENCOUNTER — Emergency Department (HOSPITAL_COMMUNITY)
Admission: EM | Admit: 2017-07-13 | Discharge: 2017-07-13 | Disposition: A | Discharge status: Home / Self Care | Payer: 59 | Attending: Emergency Medicine | Admitting: Emergency Medicine

## 2017-07-13 DIAGNOSIS — M19072 Primary osteoarthritis, left ankle and foot: Secondary | ICD-10-CM

## 2017-07-13 MED ORDER — MORPHINE SULFATE (PF) 10 MG/ML IV SOLN
10.0000 mg | Freq: Once | INTRAVENOUS | Status: AC
  Administered 2017-07-13: 10 mg via INTRAMUSCULAR
  Filled 2017-07-13: qty 1

## 2017-07-13 MED ORDER — DEXAMETHASONE SODIUM PHOSPHATE 10 MG/ML IJ SOLN
10.0000 mg | Freq: Once | INTRAMUSCULAR | Status: AC
  Administered 2017-07-13: 10 mg via INTRAMUSCULAR
  Filled 2017-07-13: qty 1

## 2017-07-13 MED ORDER — ONDANSETRON HCL 4 MG PO TABS
4.0000 mg | ORAL_TABLET | Freq: Once | ORAL | Status: AC
  Administered 2017-07-13: 4 mg via ORAL
  Filled 2017-07-13: qty 1

## 2017-07-13 MED ORDER — INDOMETHACIN 25 MG PO CAPS
25.0000 mg | ORAL_CAPSULE | Freq: Once | ORAL | Status: AC
  Administered 2017-07-13: 25 mg via ORAL
  Filled 2017-07-13: qty 1

## 2017-07-13 MED ORDER — HYDROCODONE-ACETAMINOPHEN 7.5-325 MG PO TABS: 1.0000 | ORAL_TABLET | ORAL | 0 refills | Status: DC | PRN

## 2017-07-13 MED ORDER — INDOMETHACIN 25 MG PO CAPS: 25.0000 mg | ORAL_CAPSULE | Freq: Three times a day (TID) | ORAL | 0 refills | Status: DC | PRN

## 2017-07-13 MED ORDER — COLCHICINE 0.6 MG PO TABS: 0.6000 mg | ORAL_TABLET | Freq: Every day | ORAL | 0 refills | Status: DC

## 2017-07-13 MED ORDER — HYDROCODONE-ACETAMINOPHEN 5-325 MG PO TABS: 1.0000 | ORAL_TABLET | ORAL | 0 refills | Status: DC | PRN

## 2017-07-13 MED ORDER — COLCHICINE 0.6 MG PO TABS
0.6000 mg | ORAL_TABLET | Freq: Once | ORAL | Status: AC
  Administered 2017-07-13: 0.6 mg via ORAL
  Filled 2017-07-13: qty 1

## 2017-07-13 NOTE — ED Triage Notes (Signed)
Pt c/o severe left great toe pain that started x 1 ago;

## 2017-07-13 NOTE — ED Provider Notes (Signed)
[facility] EMERGENCY DEPARTMENT Provider Note   CSN: [id number] Arrival date & time: 07/12/17  2349     History   Chief Complaint Chief Complaint  Patient presents with  . Toe Pain    HPI Sean Murillo is a 42 y.o. male.  Patient is a 42 year old male who presents to the emergency department with a complaint of left toe and foot pain.  Patient reports that he was driving in his vehicle when he had a gradual onset of increasing pain of the left great toe.  He states eventually it felt as though the toe was going to explode and that his foot was also in severe pain.  Patient states he had a similar episode involving the right lower extremity approximately 2 years ago and at that time it was related to gout.  He is not had any injury to the area.  He is not been doing any excessive walking or marching.  No recent operations or procedures involving the left lower extremity.  It is of note that the patient has been outside and inclement weather most of the day working on a generator.  He is not taking any medication for this pain so far.      Past Medical History:  Diagnosis Date  . Arthritis    lumbar spine & above- per pt.   . Depression    PTSD  . History of kidney stones    passed spontaneously x1  . PTSD (post-traumatic stress disorder)   . [facility] spotted fever   . Sleep apnea    severe- on Cpap- q night   . Spondylolisthesis of lumbar region     Patient Active Problem List   Diagnosis Date Noted  . Spondylolisthesis at L5-S1 level 03/13/2017  . Morbid obesity (HCC) 04/05/2014  . Partial small bowel obstruction (HCC) 04/04/2014  . Leukocytosis 04/04/2014    Past Surgical History:  Procedure Laterality Date  . cyst removed from right shoulder    . HERNIA REPAIR  2014, 2007   [facility] x2, also in 2001- inguinal - repair- R side   . MANDIBLE SURGERY    . SPLENECTOMY, TOTAL         Home Medications    Prior to Admission medications   Medication  Sig Start Date End Date Taking? Authorizing Provider  lisinopril (PRINIVIL,ZESTRIL) 20 MG tablet Take 20 mg by mouth daily.   Yes [provider]  acetaminophen (TYLENOL) 650 MG CR tablet Take 650 mg by mouth every 8 (eight) hours as needed for pain.    [provider]  Cholecalciferol (VITAMIN D PO) Take 1 capsule by mouth daily.    [provider]  Cyanocobalamin (VITAMIN B-12 CR PO) Take 1 tablet by mouth daily.    [provider]  furosemide (LASIX) 20 MG tablet Take 1 tablet (20 mg total) by mouth daily. 03/18/17 03/23/17  Eber Hong, MD  methocarbamol (ROBAXIN) 500 MG tablet Take 1 tablet (500 mg total) by mouth every 6 (six) hours as needed for muscle spasms. 03/14/17   Barnett Abu, MD  ondansetron (ZOFRAN) 4 MG tablet Take 1 tablet (4 mg total) by mouth every 8 (eight) hours as needed for nausea or vomiting. Patient not taking: Reported on 03/06/2017 03/04/17   Mesner, Barbara Cower, MD  oxyCODONE-acetaminophen (ROXICET) 5-325 MG tablet Take 1 tablet by mouth every 4 (four) hours as needed for moderate pain or severe pain. 03/14/17 03/14/18  Barnett Abu, MD  sertraline (ZOLOFT) 50 MG tablet Take  50 mg by mouth daily.    [provider]    Family History Family History  Problem Relation Age of Onset  . Stroke Mother     Social History Social History   Tobacco Use  . Smoking status: Never Smoker  . Smokeless tobacco: Current User    Types: Snuff  . Tobacco comment: working on it  Substance Use Topics  . Alcohol use: No    Comment: rare  . Drug use: No     Allergies   Contrast media [iodinated diagnostic agents] and Iohexol   Review of Systems Review of Systems  Constitutional: Negative for activity change.       All ROS Neg except as noted in HPI  HENT: Negative for nosebleeds.   Eyes: Negative for photophobia and discharge.  Respiratory: Negative for cough, shortness of breath and wheezing.   Cardiovascular: Negative for chest  pain and palpitations.  Gastrointestinal: Negative for abdominal pain and blood in stool.  Genitourinary: Negative for dysuria, frequency and hematuria.  Musculoskeletal: Positive for arthralgias. Negative for back pain and neck pain.       Left foot and toe pain  Skin: Negative.   Neurological: Negative for dizziness, seizures and speech difficulty.  Psychiatric/Behavioral: Negative for confusion and hallucinations.     Physical Exam Updated Vital Signs BP (!) 160/95 (BP Location: Left Arm)   Pulse 86   Temp 97.8 F (36.6 C) (Oral)   Resp 20   Ht 5\' 9"  (1.753 m)   Wt (!) 163.3 kg (360 lb)   SpO2 98%   BMI 53.16 kg/m   Physical Exam  Constitutional: He is oriented to person, place, and time. He appears well-developed and well-nourished.  Non-toxic appearance.  HENT:  Head: Normocephalic.  Right Ear: Tympanic membrane and external ear normal.  Left Ear: Tympanic membrane and external ear normal.  Eyes: EOM and lids are normal. Pupils are equal, round, and reactive to light.  Neck: Normal range of motion. Neck supple. Carotid bruit is not present.  Cardiovascular: Normal rate, regular rhythm, normal heart sounds, intact distal pulses and normal pulses.  Pulmonary/Chest: Breath sounds normal. No respiratory distress.  Abdominal: Soft. Bowel sounds are normal. There is no tenderness. There is no guarding.  Musculoskeletal: He exhibits tenderness.  There is good range of motion of the left knee, with mild crepitus present.  There is no swelling or temperature changes of the left tibial area.  There is slight increase in warmth when the left foot is compared to the right.  The dorsalis pedis pulses 2+ bilaterally.  There is pain to even light touch of the MP joint of the left great toe.  There are no lesions noted between the toes or under the foot.  And there are no puncture wounds of the plantar surface.  Capillary refill of the left foot is less than 2 seconds.  Lymphadenopathy:        Head (right side): No submandibular adenopathy present.       Head (left side): No submandibular adenopathy present.    He has no cervical adenopathy.  Neurological: He is alert and oriented to person, place, and time. He has normal strength. No cranial nerve deficit or sensory deficit.  Skin: Skin is warm and dry.  Psychiatric: He has a normal mood and affect. His speech is normal.  Nursing note and vitals reviewed.    ED Treatments / Results  Labs (all labs ordered are listed, but only abnormal results are  displayed) Labs Reviewed - No data to display  EKG  EKG Interpretation None       Radiology No results found.  Procedures Procedures (including critical care time)  Medications Ordered in ED Medications - No data to display   Initial Impression / Assessment and Plan / ED Course  I have reviewed the triage vital signs and the nursing notes.  Pertinent labs & imaging results that were available during my care of the patient were reviewed by me and considered in my medical decision making (see chart for details).       Final Clinical Impressions(s) / ED Diagnoses MDM Patient has been outside and inclement weather most of the day working on a generator.  This evening while riding in his vehicle he had gradual onset of pain in the left first toe that eventually got to severe stage.  He states it felt similar to his previous episode of gout.  There are no neurovascular deficits appreciated.  No history of any trauma or injury.  I suspect that this is an exacerbation of degenerative joint disease versus gout.  Patient will be treated with Indocin, colchicine, and hydrocodone.  Patient is to follow-up with Dr. Jean RosenthalJackson in the office.  Patient will return to the emergency department sooner if any changes, problems, or concerns.     Final diagnoses:  Osteoarthritis of joint of toe of left foot    ED Discharge Orders        Ordered    HYDROcodone-acetaminophen  (NORCO/VICODIN) 5-325 MG tablet  Every 4 hours PRN     07/13/17 0025    indomethacin (INDOCIN) 25 MG capsule  3 times daily PRN     07/13/17 0027    colchicine 0.6 MG tablet  Daily     07/13/17 0027    HYDROcodone-acetaminophen (NORCO) 7.5-325 MG tablet  Every 4 hours PRN     07/13/17 0027       Ivery QualeBryant, Valora Norell, PA-C 07/13/17 0052    Gilda CreasePollina, Christopher J, MD 07/13/17 [phone]

## 2017-07-13 NOTE — ED Notes (Signed)
ED Provider at bedside. 

## 2017-07-13 NOTE — Discharge Instructions (Signed)
Your blood pressure is slightly elevated at 160/95.  This may be related to pain.  Please have it rechecked soon.  Your examination reveals increasing pain at the base of your big toe.  Your examination is consistent with degenerative joint disease versus gout.  Please keep your foot is warm as possible.  Please use colchicine and Indocin daily.  Use Norco for more severe pain.  Norco may cause drowsiness.  Please do not drive, operate machinery, drink alcohol, or participate in activities requiring concentration when taking this medication.  Please see Dr. Jean RosenthalJackson for additional evaluation or return to the emergency department if not improving.

## 2017-07-13 NOTE — ED Notes (Signed)
Pt reports previous flare-up of gout in opposite foot 2 years ago and believes this is the same feeling again but in his left foot now.

## 2017-07-14 MED FILL — Hydrocodone-Acetaminophen Tab 5-325 MG: ORAL | Qty: 6 | Status: AC

## 2017-07-15 ENCOUNTER — Encounter (HOSPITAL_COMMUNITY): Payer: Self-pay

## 2017-07-15 ENCOUNTER — Other Ambulatory Visit (HOSPITAL_COMMUNITY): Payer: Self-pay | Admitting: Physician Assistant

## 2017-07-15 ENCOUNTER — Ambulatory Visit (HOSPITAL_COMMUNITY): Payer: 59

## 2017-07-15 DIAGNOSIS — M5441 Lumbago with sciatica, right side: Secondary | ICD-10-CM | POA: Diagnosis not present

## 2017-07-15 DIAGNOSIS — M4317 Spondylolisthesis, lumbosacral region: Secondary | ICD-10-CM

## 2017-07-15 DIAGNOSIS — R2689 Other abnormalities of gait and mobility: Secondary | ICD-10-CM

## 2017-07-15 DIAGNOSIS — M6281 Muscle weakness (generalized): Secondary | ICD-10-CM

## 2017-07-15 NOTE — Therapy (Addendum)
PHYSICAL THERAPY DISCHARGE SUMMARY  Visits from Start of Care: 10  Current functional level related to goals / functional outcomes: *see below   Remaining deficits: *see below   Education / Equipment: *see below  Plan: Patient agrees to discharge.  Patient goals were partially met. Patient is being discharged due to not returning since the last visit.  ?????          10:22 AM, 10/09/17 Etta Grandchild, PT, DPT Physical Therapist - [place] [phone] (Office)           [place], [place], [address] Phone: [phone]   Fax:  [phone]  Physical Therapy Treatment  Patient Details  Name: Sean Murillo MRN: [id number] Date of Birth: 09-Jun-1976 Referring Provider: Dr.  Kathyrn Sheriff   Encounter Date: 07/15/2017  PT End of Session - 07/15/17 1327    Visit Number  10    Number of Visits  19    Date for PT Re-Evaluation  07/26/17    Authorization Type  [employer]     Authorization Time Period  05/29/17-07/29/17    Authorization - Visit Number  10    Authorization - Number of Visits  19    PT Start Time  7026 pt late for apt    PT Stop Time  1345    PT Time Calculation (min)  27 min    Activity Tolerance  Patient limited by pain;Patient tolerated treatment well;Patient limited by fatigue    Behavior During Therapy  [facility] for tasks assessed/performed       Past Medical History:  Diagnosis Date  . Arthritis    lumbar spine & above- per pt.   . Depression    PTSD  . History of kidney stones    passed spontaneously x1  . PTSD (post-traumatic stress disorder)   . [facility] spotted fever   . Sleep apnea    severe- on Cpap- q night   . Spondylolisthesis of lumbar region     Past Surgical History:  Procedure Laterality Date  . cyst removed from right shoulder    . HERNIA REPAIR  2014, 2007   [facility] x2, also in 2001- inguinal - repair- R side   . MANDIBLE  SURGERY    . SPLENECTOMY, TOTAL      There were no vitals filed for this visit.  Subjective Assessment - 07/15/17 1323    Subjective  Pt stated his back is feeling good today.  Went to ER on Sunday due to Lt great toe pain, pain scale 9/10 Lt great toe gout pain.    Patient Stated Goals  Return to normal functional abilities    Currently in Pain?  Yes    Pain Score  9     Pain Location  Toe (Comment which one) Lt Great toe     Pain Orientation  Left    Pain Descriptors / Indicators  Sharp    Pain Type  Acute pain    Pain Radiating Towards  Big toe    Pain Onset  More than a month ago    Pain Frequency  Intermittent    Aggravating Factors   being mobile    Pain Relieving Factors  resting    Effect of Pain on Daily Activities  increases         [facility] PT Assessment - 07/15/17 0001      Assessment   Medical Diagnosis  s/p Right L5/S1  Referring Provider  Dr.  Kathyrn Sheriff    Onset Date/Surgical Date  -- March 13, 2017    Hand Dominance  Right    Next MD Visit  Monday 07/02/2016    Prior Therapy  None      Precautions   Precautions  Back No bending, lifting and twisting                  [facility] Adult PT Treatment/Exercise - 07/15/17 0001      Ambulation/Gait   Ambulation Distance (Feet)  226 Feet    Assistive device  None    Gait Comments  good mechanics, no LOB, noted decreased hip mobility and slow cadence      Lumbar Exercises: Standing   Functional Squats  10 reps 3D hip excursion    Other Standing Lumbar Exercises  marching 15x 5" for SLS      Lumbar Exercises: Seated   Other Seated Lumbar Exercises  Sitting with forward hip flexion 10x; opposite UE reaching opposite cheek (simulate whiping    Other Seated Lumbar Exercises  ER BLE crossing legs               PT Short Term Goals - 07/01/17 1322      PT SHORT TERM GOAL #1   Title  After 4 weeks patien twill demonstrate ability to tolerate 500+feet AMB at >0.102ms without increase in back  pain.     Status  On-going      PT SHORT TERM GOAL #2   Title  After 4 weeks patient will demonstrate 5xSTS hands free <20 seconds.     Status  Achieved      PT SHORT TERM GOAL #3   Title  After 2 weeks patient will demonstrate independence in basic HEP to manage pain and improve strength.     Status  Achieved        PT Long Term Goals - 07/01/17 1326      PT LONG TERM GOAL #1   Title  After 8 weeks patient will demonstrate strength 5/5 in all tested muscle groups in order to reduce pain and assist in return to work.    Status  On-going      PT LONG TERM GOAL #2   Title  After 8 weeks patient will demonstrate ability to perform 1500+feet at 1.045m or faster without exacerbation of pain.     Status  On-going      PT LONG TERM GOAL #3   Title  After 8 weeks patient will demonstrate proper technique for lifting loads of 25lbs 20x without exacerbation of pain to prepare for return to work.     Status  On-going      PT LONG TERM GOAL #4   Title  After 8 weeks patient will demonstrate 5x STS hands free in less than 14seconds to demonstrate improvement in functional strength.     Status  On-going            Plan - 07/15/17 1345    Clinical Impression Statement  Session focus on improving hip mobility and core strengthening.  Gait training complete without AD with no LOB, pt with slow cadence and limited hip mobility with gait.  Added therex to improve core strengtheing with hip mobility exercises to assist with daily activities including wiping himself following a bowel movement and putting on socks/shoes.  Pt reports increased independence at home with wiping more and putting on shoes with less assistance.  No reports of  pain through session, was limited by fatigue with tasks.    Rehab Potential  Good    Clinical Impairments Affecting Rehab Potential  recent weight gain    PT Frequency  3x / week    PT Duration  4 weeks    PT Treatment/Interventions  ADLs/Self Care Home  Management;Moist Heat;Electrical Stimulation;DME Instruction;Gait training;Stair training;Functional mobility training;Therapeutic exercise;Balance training;Neuromuscular re-education;Patient/family education;Dry needling;Passive range of motion;Scar mobilization    PT Next Visit Plan  Continue hip mobility exercises to increase indepdence at home (putting on sock/shoes, wiping after restroom).  Encourage more gait without AD and for activity tolerance.  Progress functional strengthening with lifting, stairs and lunges for functional strengthening.      PT Home Exercise Plan  SKTC, Heel slides, seated marching, ADIM; GTB posture strengthening; begin walking program; side step and clam with RTB       Patient will benefit from skilled therapeutic intervention in order to improve the following deficits and impairments:  Hypomobility, Decreased activity tolerance, Decreased mobility, Difficulty walking, Postural dysfunction, Improper body mechanics, Decreased strength, Obesity, Pain  Visit Diagnosis: Acute midline low back pain with right-sided sciatica  Muscle weakness (generalized)  Other abnormalities of gait and mobility     Problem List Patient Active Problem List   Diagnosis Date Noted  . Spondylolisthesis at L5-S1 level 03/13/2017  . Morbid obesity ([place]) 04/05/2014  . Partial small bowel obstruction ([place]) 04/04/2014  . Leukocytosis 04/04/2014   Ihor Austin, [place]; [place]  Aldona Lento 07/15/2017, 3:59 PM  [facility] [address] [place], [place], [address] Phone: [phone]   Fax:  [phone]  Name: Rawson Minix MRN: [id number] Date of Birth: Dec 12, 1975

## 2017-07-20 ENCOUNTER — Ambulatory Visit (HOSPITAL_COMMUNITY): Payer: 59 | Admitting: Physical Therapy

## 2017-07-20 ENCOUNTER — Telehealth (HOSPITAL_COMMUNITY): Payer: Self-pay | Admitting: Family Medicine

## 2017-07-20 NOTE — Telephone Encounter (Signed)
07/20/17  wife called to cx said that husband just received a phone call about a drs appt around the same time as his therapy appt today

## 2017-07-22 ENCOUNTER — Ambulatory Visit (HOSPITAL_COMMUNITY): Payer: 59 | Admitting: Physical Therapy

## 2017-07-23 ENCOUNTER — Telehealth (HOSPITAL_COMMUNITY): Payer: Self-pay

## 2017-07-23 ENCOUNTER — Ambulatory Visit (HOSPITAL_COMMUNITY): Payer: 59

## 2017-07-23 NOTE — Telephone Encounter (Signed)
No show, called and spoke to pt who thought apt was scheduled for different time.  Reminded next apt date and time with contact info given.    [address]Jazper Nikolai, LPTA; CBIS [phone]

## 2017-07-24 ENCOUNTER — Ambulatory Visit (HOSPITAL_COMMUNITY): Payer: 59

## 2017-07-24 ENCOUNTER — Telehealth (HOSPITAL_COMMUNITY): Payer: Self-pay | Admitting: Family Medicine

## 2017-07-24 NOTE — Telephone Encounter (Signed)
07/24/17  wife left a message to cx said that he was sick today

## 2017-07-27 ENCOUNTER — Ambulatory Visit (HOSPITAL_COMMUNITY): Payer: 59

## 2017-07-27 ENCOUNTER — Telehealth (HOSPITAL_COMMUNITY): Payer: Self-pay | Admitting: Physical Therapy

## 2017-07-27 ENCOUNTER — Ambulatory Visit (HOSPITAL_COMMUNITY): Payer: 59 | Admitting: Physical Therapy

## 2017-07-27 NOTE — Telephone Encounter (Signed)
Called pt re missed appointment.  [facility]  Therapist explained that he does not have any further scheduled appointments and if the MD wants him to continue to please call back to the clinic and schedule.  Virgina Organynthia Russell, PT CLT [phone]

## 2017-08-07 ENCOUNTER — Ambulatory Visit (HOSPITAL_COMMUNITY)
Admission: RE | Admit: 2017-08-07 | Discharge: 2017-08-07 | Disposition: A | Discharge status: Home / Self Care | Payer: 59 | Source: Ambulatory Visit | Attending: Physician Assistant | Admitting: Physician Assistant

## 2017-08-07 DIAGNOSIS — M5136 Other intervertebral disc degeneration, lumbar region: Secondary | ICD-10-CM | POA: Diagnosis not present

## 2017-08-07 DIAGNOSIS — M4317 Spondylolisthesis, lumbosacral region: Secondary | ICD-10-CM | POA: Insufficient documentation

## 2017-08-13 ENCOUNTER — Other Ambulatory Visit: Payer: Self-pay | Admitting: Neurosurgery

## 2017-08-24 NOTE — Pre-Procedure Instructions (Signed)
Sean BenderMichael Murillo  08/24/2017      [facility] [phone]#[id number] - [place], [place] - [address] AT [facility] [address] & [id number] [place] [id number] Phone: [phone] Fax: [phone]  [id number] [place], [place] - [address] AT [facility] [address] [place] [address] Phone: [phone] Fax: [phone]    Your procedure is scheduled on September 01, 2017.  [facility] at 08:15 A.M.  Call this number if you have problems the morning of surgery:  [phone]   Remember:   Do not eat food or drink liquids after midnight.   Take these medicines the morning of surgery with A SIP OF WATER :  Sertraline (Zoloft)  7 days prior to surgery STOP taking any Aspirin (unless otherwise instructed by your surgeon), Aleve, Naproxen, Ibuprofen, Motrin, Advil, Goody's, BC's, all herbal medications, fish oil, and all vitamins.   Do not wear jewelry.  Do not wear lotions, powders, or perfumes, or deodorant.  Do not shave 48 hours prior to surgery.  Men may shave face and neck.  Do not bring valuables to the hospital.   [facility] is not responsible for any belongings or valuables.  Contacts, dentures or bridgework may not be worn into surgery.  Leave your suitcase in the car.  After surgery it may be brought to your room.  For patients admitted to the hospital, discharge time will be determined by your treatment team.  Patients discharged the day of surgery will not be allowed to drive home.   Special instructions:   [facility] [facility]- Preparing For Surgery  Before surgery, you can play an important role. Because skin is not sterile, your skin needs to be as free of germs as possible. You can reduce the number of germs on your skin by washing with CHG (chlorahexidine gluconate) Soap before surgery.  CHG is an antiseptic cleaner which kills germs and bonds with the skin to continue killing germs even after  washing.  Please do not use if you have an allergy to CHG or antibacterial soaps. If your skin becomes reddened/irritated stop using the CHG.  Do not shave (including legs and underarms) for at least 48 hours prior to first CHG shower. It is OK to shave your face.  Please follow these instructions carefully.   1. Shower the NIGHT BEFORE SURGERY and the MORNING OF SURGERY with CHG.   2. If you chose to wash your hair, wash your hair first as usual with your normal shampoo.  3. After you shampoo, rinse your hair and body thoroughly to remove the shampoo.  4. Use CHG as you would any other liquid soap. You can apply CHG directly to the skin and wash gently with a scrungie or a clean washcloth.   5. Apply the CHG Soap to your body ONLY FROM THE NECK DOWN.  Do not use on open wounds or open sores. Avoid contact with your eyes, ears, mouth and genitals (private parts). Wash Face and genitals (private parts)  with your normal soap.  6. Wash thoroughly, paying special attention to the area where your surgery will be performed.  7. Thoroughly rinse your body with warm water from the neck down.  8. DO NOT shower/wash with your normal soap after using and rinsing off the CHG Soap.  9. Pat yourself dry with a CLEAN TOWEL.  10. Wear CLEAN PAJAMAS to bed the night before surgery, wear comfortable clothes the  morning of surgery  11. Place CLEAN SHEETS on your bed the night of your first shower and DO NOT SLEEP WITH PETS.    Day of Surgery: Do not apply any deodorants/lotions. Please wear clean clothes to the hospital/surgery center.      Please read over the following fact sheets that you were given.

## 2017-08-25 ENCOUNTER — Encounter (HOSPITAL_COMMUNITY)
Admission: RE | Admit: 2017-08-25 | Discharge: 2017-08-25 | Disposition: A | Discharge status: Home / Self Care | Payer: 59 | Source: Ambulatory Visit | Attending: Neurosurgery | Admitting: Neurosurgery

## 2017-08-25 ENCOUNTER — Encounter (HOSPITAL_COMMUNITY): Payer: Self-pay

## 2017-08-25 ENCOUNTER — Other Ambulatory Visit: Payer: Self-pay

## 2017-08-25 DIAGNOSIS — Z01810 Encounter for preprocedural cardiovascular examination: Secondary | ICD-10-CM | POA: Insufficient documentation

## 2017-08-25 DIAGNOSIS — Z01812 Encounter for preprocedural laboratory examination: Secondary | ICD-10-CM | POA: Diagnosis present

## 2017-08-25 DIAGNOSIS — R9431 Abnormal electrocardiogram [ECG] [EKG]: Secondary | ICD-10-CM | POA: Insufficient documentation

## 2017-08-25 DIAGNOSIS — I10 Essential (primary) hypertension: Secondary | ICD-10-CM | POA: Insufficient documentation

## 2017-08-25 HISTORY — DX: Essential (primary) hypertension: I10

## 2017-08-25 HISTORY — DX: Pneumonia, unspecified organism: J18.9

## 2017-08-25 LAB — SURGICAL PCR SCREEN
MRSA, PCR: NEGATIVE
Staphylococcus aureus: POSITIVE — AB

## 2017-08-25 LAB — CBC
HEMATOCRIT: 41.5 % (ref 39.0–52.0)
HEMOGLOBIN: 14.6 g/dL (ref 13.0–17.0)
MCH: 28.2 pg (ref 26.0–34.0)
MCHC: 35.2 g/dL (ref 30.0–36.0)
MCV: 80.1 fL (ref 78.0–100.0)
Platelets: 386 10*3/uL (ref 150–400)
RBC: 5.18 MIL/uL (ref 4.22–5.81)
RDW: 15.3 % (ref 11.5–15.5)
WBC: 12.4 10*3/uL — ABNORMAL HIGH (ref 4.0–10.5)

## 2017-08-25 LAB — BASIC METABOLIC PANEL
ANION GAP: 9 (ref 5–15)
BUN: 10 mg/dL (ref 6–20)
CHLORIDE: 105 mmol/L (ref 101–111)
CO2: 26 mmol/L (ref 22–32)
Calcium: 9.4 mg/dL (ref 8.9–10.3)
Creatinine, Ser: 0.77 mg/dL (ref 0.61–1.24)
GFR calc Af Amer: >60 mL/min (ref >60)
GFR calc non Af Amer: >60 mL/min (ref >60)
GLUCOSE: 104 mg/dL — AB (ref 65–99)
Potassium: 3.8 mmol/L (ref 3.5–5.1)
Sodium: 140 mmol/L (ref 135–145)

## 2017-08-25 NOTE — Progress Notes (Signed)
PCP: Terie PurserSamantha Jackson, PA-C, [facility] [place] [facility] [place] Cardiologist: Denies  EKG: Today CXR: Denies ECHO: Reports had "routine ECHO" to [facility], requested records Stress Test: Denies Cardiac Cath: Denies  Patient denies shortness of breath, fever, cough, and chest pain at PAT appointment.  Patient verbalized understanding of instructions provided today at the PAT appointment.  Patient asked to review instructions at home and day of surgery.

## 2017-08-26 NOTE — Progress Notes (Signed)
Mupirocin Ointment Rx called into [facility] in [place] for positive PCR of Staph. Left message on pt's voicemail of results and need to pick up Rx.

## 2017-08-26 NOTE — Progress Notes (Signed)
Anesthesia Chart Review:  Pt is a 42 year old male scheduled for revision left L5 screw on 09/01/2017 with Lisbeth RenshawNeelesh Nundkumar, MD  - PCP is Terie PurserSamantha Jackson, PA - [facility]  PMH includes:   HTN, OSA, PTSD. Never smoker. BMI 55.5. S/p PLIF 03/13/17  Medications include: Lasix, lisinopril   BP (!) 142/89   Pulse 86   Temp 36.8 C   Resp 20   Ht 5\' 9"  (1.753 m)   Wt (!) 375 lb 11.2 oz (170.4 kg)   SpO2 97%   BMI 55.48 kg/m    Preoperative labs reviewed.    CXR 08/11/16: Stable cardiomegaly. Right basilar platelike atelectasis. No focal consolidation.  EKG 08/25/17: NSR. Inferior infarct, age undetermined. Left axis deviation. Appears stable when compared to EKG 08/11/16  Echo [facility]14/17 [facility] 1.  Technically difficult study.  Definity ultrasound contrast administered. 2.  Mild RA enlargement. 3.  Mild to moderate LVH.  Normal LV size and function, EF 55-60%.  Grade II diastolic dysfunction. 4.  RV not well visualized but appears mildly dilated with preserved function based on TA PSC. 5.  No significant valvular regurgitation or stenosis 6.  No pericardial effusion. 7.  No prior study available for comparison  If no changes, I anticipate pt can proceed with surgery as scheduled.   Rica Mastngela Cheronda Erck, FNP-BC [facility]/Anesthesiology Phone: [phone] 08/26/2017 1:59 PM

## 2017-08-31 MED ORDER — DEXTROSE 5 % IV SOLN
3.0000 g | INTRAVENOUS | Status: AC
  Administered 2017-09-01: 3 g via INTRAVENOUS
  Filled 2017-08-31: qty 3

## 2017-09-01 ENCOUNTER — Encounter (HOSPITAL_COMMUNITY): Payer: Self-pay | Admitting: *Deleted

## 2017-09-01 ENCOUNTER — Observation Stay (HOSPITAL_COMMUNITY)
Admission: RE | Admit: 2017-09-01 | Discharge: 2017-09-02 | Disposition: A | Discharge status: Home / Self Care | Payer: 59 | Source: Ambulatory Visit | Attending: Neurosurgery | Admitting: Neurosurgery

## 2017-09-01 ENCOUNTER — Ambulatory Visit (HOSPITAL_COMMUNITY): Payer: 59 | Admitting: Emergency Medicine

## 2017-09-01 ENCOUNTER — Other Ambulatory Visit: Payer: Self-pay

## 2017-09-01 ENCOUNTER — Encounter (HOSPITAL_COMMUNITY)
Admission: RE | Disposition: A | Discharge status: Home / Self Care | Payer: Self-pay | Source: Ambulatory Visit | Attending: Neurosurgery

## 2017-09-01 ENCOUNTER — Ambulatory Visit (HOSPITAL_COMMUNITY): Payer: 59 | Admitting: Anesthesiology

## 2017-09-01 ENCOUNTER — Ambulatory Visit (HOSPITAL_COMMUNITY): Payer: 59

## 2017-09-01 DIAGNOSIS — I10 Essential (primary) hypertension: Secondary | ICD-10-CM | POA: Insufficient documentation

## 2017-09-01 DIAGNOSIS — F419 Anxiety disorder, unspecified: Secondary | ICD-10-CM | POA: Diagnosis not present

## 2017-09-01 DIAGNOSIS — Z79899 Other long term (current) drug therapy: Secondary | ICD-10-CM | POA: Insufficient documentation

## 2017-09-01 DIAGNOSIS — F329 Major depressive disorder, single episode, unspecified: Secondary | ICD-10-CM | POA: Diagnosis not present

## 2017-09-01 DIAGNOSIS — Z419 Encounter for procedure for purposes other than remedying health state, unspecified: Secondary | ICD-10-CM

## 2017-09-01 DIAGNOSIS — Y929 Unspecified place or not applicable: Secondary | ICD-10-CM | POA: Insufficient documentation

## 2017-09-01 DIAGNOSIS — Y798 Miscellaneous orthopedic devices associated with adverse incidents, not elsewhere classified: Secondary | ICD-10-CM | POA: Diagnosis not present

## 2017-09-01 DIAGNOSIS — Z6841 Body Mass Index (BMI) 40.0 and over, adult: Secondary | ICD-10-CM | POA: Diagnosis not present

## 2017-09-01 DIAGNOSIS — G4730 Sleep apnea, unspecified: Secondary | ICD-10-CM | POA: Insufficient documentation

## 2017-09-01 DIAGNOSIS — T8484XA Pain due to internal orthopedic prosthetic devices, implants and grafts, initial encounter: Secondary | ICD-10-CM | POA: Diagnosis not present

## 2017-09-01 DIAGNOSIS — T85848A Pain due to other internal prosthetic devices, implants and grafts, initial encounter: Secondary | ICD-10-CM | POA: Diagnosis present

## 2017-09-01 HISTORY — PX: HARDWARE REMOVAL: SHX979

## 2017-09-01 SURGERY — REMOVAL, HARDWARE
Anesthesia: General | Site: Spine Lumbar

## 2017-09-01 MED ORDER — EPHEDRINE 5 MG/ML INJ
INTRAVENOUS | Status: AC
  Filled 2017-09-01: qty 10

## 2017-09-01 MED ORDER — SODIUM CHLORIDE 0.9% FLUSH: 3.0000 mL | INTRAVENOUS | Status: DC | PRN

## 2017-09-01 MED ORDER — ONDANSETRON HCL 4 MG/2ML IJ SOLN
INTRAMUSCULAR | Status: AC
  Filled 2017-09-01: qty 2

## 2017-09-01 MED ORDER — DEXAMETHASONE SODIUM PHOSPHATE 10 MG/ML IJ SOLN
INTRAMUSCULAR | Status: AC
  Filled 2017-09-01: qty 2

## 2017-09-01 MED ORDER — PHENYLEPHRINE 40 MCG/ML (10ML) SYRINGE FOR IV PUSH (FOR BLOOD PRESSURE SUPPORT)
PREFILLED_SYRINGE | INTRAVENOUS | Status: AC
  Filled 2017-09-01: qty 20

## 2017-09-01 MED ORDER — FENTANYL CITRATE (PF) 250 MCG/5ML IJ SOLN
INTRAMUSCULAR | Status: AC
  Filled 2017-09-01: qty 5

## 2017-09-01 MED ORDER — LIDOCAINE 2% (20 MG/ML) 5 ML SYRINGE
INTRAMUSCULAR | Status: AC
  Filled 2017-09-01: qty 5

## 2017-09-01 MED ORDER — HYDROMORPHONE HCL 1 MG/ML IJ SOLN: 1.0000 mg | INTRAMUSCULAR | Status: DC | PRN

## 2017-09-01 MED ORDER — CHLORHEXIDINE GLUCONATE CLOTH 2 % EX PADS: 6.0000 | MEDICATED_PAD | Freq: Once | CUTANEOUS | Status: DC

## 2017-09-01 MED ORDER — DEXAMETHASONE SODIUM PHOSPHATE 10 MG/ML IJ SOLN
INTRAMUSCULAR | Status: AC
  Filled 2017-09-01: qty 1

## 2017-09-01 MED ORDER — SODIUM CHLORIDE 0.9% FLUSH
3.0000 mL | Freq: Two times a day (BID) | INTRAVENOUS | Status: DC
  Administered 2017-09-01: 3 mL via INTRAVENOUS

## 2017-09-01 MED ORDER — 0.9 % SODIUM CHLORIDE (POUR BTL) OPTIME
TOPICAL | Status: DC | PRN
  Administered 2017-09-01: 1000 mL

## 2017-09-01 MED ORDER — DOCUSATE SODIUM 100 MG PO CAPS
100.0000 mg | ORAL_CAPSULE | Freq: Two times a day (BID) | ORAL | Status: DC
  Administered 2017-09-01 (×2): 100 mg via ORAL
  Filled 2017-09-01 (×2): qty 1

## 2017-09-01 MED ORDER — FENTANYL CITRATE (PF) 100 MCG/2ML IJ SOLN
25.0000 ug | INTRAMUSCULAR | Status: DC | PRN
  Administered 2017-09-01 (×3): 50 ug via INTRAVENOUS

## 2017-09-01 MED ORDER — OXYCODONE HCL 5 MG PO TABS
ORAL_TABLET | ORAL | Status: AC
  Filled 2017-09-01: qty 2

## 2017-09-01 MED ORDER — PROPOFOL 10 MG/ML IV BOLUS
INTRAVENOUS | Status: AC
  Filled 2017-09-01: qty 20

## 2017-09-01 MED ORDER — MIDAZOLAM HCL 2 MG/2ML IJ SOLN
INTRAMUSCULAR | Status: AC
  Filled 2017-09-01: qty 2

## 2017-09-01 MED ORDER — FLEET ENEMA 7-19 GM/118ML RE ENEM: 1.0000 | ENEMA | Freq: Once | RECTAL | Status: DC | PRN

## 2017-09-01 MED ORDER — MIDAZOLAM HCL 5 MG/5ML IJ SOLN
INTRAMUSCULAR | Status: DC | PRN
  Administered 2017-09-01: 2 mg via INTRAVENOUS

## 2017-09-01 MED ORDER — SODIUM CHLORIDE 0.9 % IV SOLN: 250.0000 mL | INTRAVENOUS | Status: DC

## 2017-09-01 MED ORDER — ACETAMINOPHEN 500 MG PO TABS
1000.0000 mg | ORAL_TABLET | Freq: Four times a day (QID) | ORAL | Status: AC
  Administered 2017-09-01 – 2017-09-02 (×4): 1000 mg via ORAL
  Filled 2017-09-01 (×4): qty 2

## 2017-09-01 MED ORDER — SODIUM CHLORIDE 0.9 % IV SOLN: INTRAVENOUS | Status: DC

## 2017-09-01 MED ORDER — DEXAMETHASONE SODIUM PHOSPHATE 10 MG/ML IJ SOLN
INTRAMUSCULAR | Status: DC | PRN
  Administered 2017-09-01: 10 mg via INTRAVENOUS

## 2017-09-01 MED ORDER — LIDOCAINE-EPINEPHRINE 1 %-1:100000 IJ SOLN
INTRAMUSCULAR | Status: AC
  Filled 2017-09-01: qty 1

## 2017-09-01 MED ORDER — ONDANSETRON HCL 4 MG/2ML IJ SOLN
INTRAMUSCULAR | Status: AC
  Filled 2017-09-01: qty 4

## 2017-09-01 MED ORDER — FENTANYL CITRATE (PF) 100 MCG/2ML IJ SOLN
INTRAMUSCULAR | Status: AC
  Filled 2017-09-01: qty 2

## 2017-09-01 MED ORDER — LIDOCAINE HCL (CARDIAC) 20 MG/ML IV SOLN
INTRAVENOUS | Status: DC | PRN
  Administered 2017-09-01: 100 mg via INTRAVENOUS

## 2017-09-01 MED ORDER — OXYCODONE HCL 5 MG PO TABS
10.0000 mg | ORAL_TABLET | ORAL | Status: DC | PRN
  Administered 2017-09-01 – 2017-09-02 (×3): 10 mg via ORAL
  Filled 2017-09-01 (×2): qty 2

## 2017-09-01 MED ORDER — MENTHOL 3 MG MT LOZG: 1.0000 | LOZENGE | OROMUCOSAL | Status: DC | PRN

## 2017-09-01 MED ORDER — SODIUM CHLORIDE 0.9 % IR SOLN
Status: DC | PRN
  Administered 2017-09-01: 500 mL

## 2017-09-01 MED ORDER — CEFAZOLIN SODIUM-DEXTROSE 2-4 GM/100ML-% IV SOLN
2.0000 g | Freq: Three times a day (TID) | INTRAVENOUS | Status: AC
  Administered 2017-09-01 – 2017-09-02 (×2): 2 g via INTRAVENOUS
  Filled 2017-09-01 (×2): qty 100

## 2017-09-01 MED ORDER — HYDROCODONE-ACETAMINOPHEN 5-325 MG PO TABS: 1.0000 | ORAL_TABLET | ORAL | Status: DC | PRN

## 2017-09-01 MED ORDER — OXYCODONE HCL 5 MG PO TABS: 5.0000 mg | ORAL_TABLET | Freq: Once | ORAL | Status: DC | PRN

## 2017-09-01 MED ORDER — SENNOSIDES-DOCUSATE SODIUM 8.6-50 MG PO TABS: 1.0000 | ORAL_TABLET | Freq: Every evening | ORAL | Status: DC | PRN

## 2017-09-01 MED ORDER — ROCURONIUM BROMIDE 10 MG/ML (PF) SYRINGE
PREFILLED_SYRINGE | INTRAVENOUS | Status: AC
  Filled 2017-09-01: qty 15

## 2017-09-01 MED ORDER — BISACODYL 10 MG RE SUPP: 10.0000 mg | Freq: Every day | RECTAL | Status: DC | PRN

## 2017-09-01 MED ORDER — PHENYLEPHRINE HCL 10 MG/ML IJ SOLN
INTRAVENOUS | Status: DC | PRN
  Administered 2017-09-01: 50 ug/min via INTRAVENOUS

## 2017-09-01 MED ORDER — ROCURONIUM BROMIDE 50 MG/5ML IV SOLN
INTRAVENOUS | Status: AC
  Filled 2017-09-01: qty 2

## 2017-09-01 MED ORDER — FENTANYL CITRATE (PF) 100 MCG/2ML IJ SOLN
INTRAMUSCULAR | Status: DC | PRN
  Administered 2017-09-01: 50 ug via INTRAVENOUS
  Administered 2017-09-01: 100 ug via INTRAVENOUS
  Administered 2017-09-01 (×2): 25 ug via INTRAVENOUS
  Administered 2017-09-01: 100 ug via INTRAVENOUS

## 2017-09-01 MED ORDER — ZOLPIDEM TARTRATE 5 MG PO TABS: 5.0000 mg | ORAL_TABLET | Freq: Every evening | ORAL | Status: DC | PRN

## 2017-09-01 MED ORDER — BUPIVACAINE HCL (PF) 0.5 % IJ SOLN
INTRAMUSCULAR | Status: AC
  Filled 2017-09-01: qty 30

## 2017-09-01 MED ORDER — PROMETHAZINE HCL 25 MG/ML IJ SOLN: 6.2500 mg | INTRAMUSCULAR | Status: DC | PRN

## 2017-09-01 MED ORDER — ONDANSETRON HCL 4 MG/2ML IJ SOLN
INTRAMUSCULAR | Status: DC | PRN
  Administered 2017-09-01: 4 mg via INTRAVENOUS

## 2017-09-01 MED ORDER — METHOCARBAMOL 500 MG PO TABS
ORAL_TABLET | ORAL | Status: AC
  Filled 2017-09-01: qty 1

## 2017-09-01 MED ORDER — SUGAMMADEX SODIUM 500 MG/5ML IV SOLN
INTRAVENOUS | Status: AC
  Filled 2017-09-01: qty 5

## 2017-09-01 MED ORDER — PROPOFOL 10 MG/ML IV BOLUS
INTRAVENOUS | Status: DC | PRN
  Administered 2017-09-01: 190 mg via INTRAVENOUS

## 2017-09-01 MED ORDER — PHENYLEPHRINE HCL 10 MG/ML IJ SOLN
INTRAMUSCULAR | Status: DC | PRN
  Administered 2017-09-01: 120 ug via INTRAVENOUS
  Administered 2017-09-01: 240 ug via INTRAVENOUS
  Administered 2017-09-01 (×2): 120 ug via INTRAVENOUS

## 2017-09-01 MED ORDER — ACETAMINOPHEN 325 MG PO TABS: 650.0000 mg | ORAL_TABLET | ORAL | Status: DC | PRN

## 2017-09-01 MED ORDER — OXYCODONE HCL 5 MG/5ML PO SOLN: 5.0000 mg | Freq: Once | ORAL | Status: DC | PRN

## 2017-09-01 MED ORDER — ACETAMINOPHEN 650 MG RE SUPP: 650.0000 mg | RECTAL | Status: DC | PRN

## 2017-09-01 MED ORDER — LACTATED RINGERS IV SOLN
INTRAVENOUS | Status: DC
  Administered 2017-09-01 (×3): via INTRAVENOUS

## 2017-09-01 MED ORDER — METHOCARBAMOL 500 MG PO TABS
500.0000 mg | ORAL_TABLET | Freq: Four times a day (QID) | ORAL | Status: DC | PRN
  Administered 2017-09-01 (×2): 500 mg via ORAL
  Filled 2017-09-01: qty 1

## 2017-09-01 MED ORDER — PHENYLEPHRINE HCL 10 MG/ML IJ SOLN: INTRAVENOUS | Status: DC | PRN

## 2017-09-01 MED ORDER — PHENYLEPHRINE 40 MCG/ML (10ML) SYRINGE FOR IV PUSH (FOR BLOOD PRESSURE SUPPORT)
PREFILLED_SYRINGE | INTRAVENOUS | Status: AC
  Filled 2017-09-01: qty 10

## 2017-09-01 MED ORDER — SUGAMMADEX SODIUM 200 MG/2ML IV SOLN
INTRAVENOUS | Status: AC
  Filled 2017-09-01: qty 2

## 2017-09-01 MED ORDER — ONDANSETRON HCL 4 MG PO TABS: 4.0000 mg | ORAL_TABLET | Freq: Four times a day (QID) | ORAL | Status: DC | PRN

## 2017-09-01 MED ORDER — PHENOL 1.4 % MT LIQD: 1.0000 | OROMUCOSAL | Status: DC | PRN

## 2017-09-01 MED ORDER — ROCURONIUM BROMIDE 100 MG/10ML IV SOLN
INTRAVENOUS | Status: DC | PRN
  Administered 2017-09-01: 20 mg via INTRAVENOUS
  Administered 2017-09-01: 60 mg via INTRAVENOUS

## 2017-09-01 MED ORDER — GABAPENTIN 300 MG PO CAPS
300.0000 mg | ORAL_CAPSULE | Freq: Three times a day (TID) | ORAL | Status: DC
  Administered 2017-09-01 (×2): 300 mg via ORAL
  Filled 2017-09-01 (×2): qty 1

## 2017-09-01 MED ORDER — METHOCARBAMOL 1000 MG/10ML IJ SOLN: 500.0000 mg | Freq: Four times a day (QID) | INTRAVENOUS | Status: DC | PRN

## 2017-09-01 MED ORDER — THROMBIN (RECOMBINANT) 5000 UNITS EX SOLR
OROMUCOSAL | Status: DC | PRN
  Administered 2017-09-01: 5 mL via TOPICAL

## 2017-09-01 MED ORDER — SUGAMMADEX SODIUM 500 MG/5ML IV SOLN
INTRAVENOUS | Status: DC | PRN
  Administered 2017-09-01: 350 mg via INTRAVENOUS

## 2017-09-01 MED ORDER — METHYLPREDNISOLONE ACETATE 80 MG/ML IJ SUSP
INTRAMUSCULAR | Status: AC
  Filled 2017-09-01: qty 1

## 2017-09-01 MED ORDER — ONDANSETRON HCL 4 MG/2ML IJ SOLN: 4.0000 mg | Freq: Four times a day (QID) | INTRAMUSCULAR | Status: DC | PRN

## 2017-09-01 MED ORDER — HYDROCODONE-ACETAMINOPHEN 7.5-325 MG PO TABS
1.0000 | ORAL_TABLET | Freq: Four times a day (QID) | ORAL | Status: DC
  Administered 2017-09-01 – 2017-09-02 (×4): 1 via ORAL
  Filled 2017-09-01 (×4): qty 1

## 2017-09-01 MED ORDER — LIDOCAINE 2% (20 MG/ML) 5 ML SYRINGE
INTRAMUSCULAR | Status: AC
  Filled 2017-09-01: qty 15

## 2017-09-01 MED ORDER — SENNA 8.6 MG PO TABS
1.0000 | ORAL_TABLET | Freq: Two times a day (BID) | ORAL | Status: DC
  Administered 2017-09-01 (×2): 8.6 mg via ORAL
  Filled 2017-09-01 (×2): qty 1

## 2017-09-01 MED ORDER — THROMBIN 5000 UNITS EX SOLR
CUTANEOUS | Status: AC
  Filled 2017-09-01: qty 5000

## 2017-09-01 SURGICAL SUPPLY — 74 items
BAG DECANTER FOR FLEXI CONT (MISCELLANEOUS) ×2 IMPLANT
BENZOIN TINCTURE PRP APPL 2/3 (GAUZE/BANDAGES/DRESSINGS) IMPLANT
BIT DRILL 3.5 POWEREASE (BIT) ×2 IMPLANT
BLADE CLIPPER SURG (BLADE) IMPLANT
BLADE SURG 11 STRL SS (BLADE) ×2 IMPLANT
BUR MATCHSTICK NEURO 3.0 LAGG (BURR) ×2 IMPLANT
CANISTER SUCT 3000ML PPV (MISCELLANEOUS) ×2 IMPLANT
CARTRIDGE OIL MAESTRO DRILL (MISCELLANEOUS) ×1 IMPLANT
CHLORAPREP W/TINT 10.5 ML (MISCELLANEOUS) ×2 IMPLANT
DECANTER SPIKE VIAL GLASS SM (MISCELLANEOUS) ×2 IMPLANT
DERMABOND ADVANCED (GAUZE/BANDAGES/DRESSINGS) ×1
DERMABOND ADVANCED .7 DNX12 (GAUZE/BANDAGES/DRESSINGS) ×1 IMPLANT
DIFFUSER DRILL AIR PNEUMATIC (MISCELLANEOUS) ×2 IMPLANT
DRAPE C-ARM 42X72 X-RAY (DRAPES) ×2 IMPLANT
DRAPE C-ARMOR (DRAPES) ×2 IMPLANT
DRAPE LAPAROTOMY 100X72X124 (DRAPES) ×2 IMPLANT
DRAPE MICROSCOPE LEICA (MISCELLANEOUS) IMPLANT
DRAPE POUCH INSTRU U-SHP 10X18 (DRAPES) ×2 IMPLANT
DRAPE SURG 17X23 STRL (DRAPES) ×2 IMPLANT
DRSG OPSITE POSTOP 3X4 (GAUZE/BANDAGES/DRESSINGS) IMPLANT
DRSG OPSITE POSTOP 4X6 (GAUZE/BANDAGES/DRESSINGS) ×2 IMPLANT
DURAPREP 26ML APPLICATOR (WOUND CARE) IMPLANT
ELECT BLADE 4.0 EZ CLEAN MEGAD (MISCELLANEOUS) ×2
ELECT REM PT RETURN 9FT ADLT (ELECTROSURGICAL) ×2
ELECTRODE BLDE 4.0 EZ CLN MEGD (MISCELLANEOUS) ×1 IMPLANT
ELECTRODE REM PT RTRN 9FT ADLT (ELECTROSURGICAL) ×1 IMPLANT
GAUZE SPONGE 4X4 12PLY STRL (GAUZE/BANDAGES/DRESSINGS) IMPLANT
GAUZE SPONGE 4X4 16PLY XRAY LF (GAUZE/BANDAGES/DRESSINGS) IMPLANT
GLOVE BIO SURGEON STRL SZ7 (GLOVE) ×2 IMPLANT
GLOVE BIOGEL PI IND STRL 7.0 (GLOVE) ×1 IMPLANT
GLOVE BIOGEL PI IND STRL 7.5 (GLOVE) ×1 IMPLANT
GLOVE BIOGEL PI IND STRL 8 (GLOVE) ×2 IMPLANT
GLOVE BIOGEL PI INDICATOR 7.0 (GLOVE) ×1
GLOVE BIOGEL PI INDICATOR 7.5 (GLOVE) ×1
GLOVE BIOGEL PI INDICATOR 8 (GLOVE) ×2
GLOVE ECLIPSE 7.0 STRL STRAW (GLOVE) ×2 IMPLANT
GLOVE ECLIPSE 7.5 STRL STRAW (GLOVE) ×10 IMPLANT
GLOVE ECLIPSE 9.0 STRL (GLOVE) ×2 IMPLANT
GLOVE EXAM NITRILE LRG STRL (GLOVE) IMPLANT
GLOVE EXAM NITRILE XL STR (GLOVE) IMPLANT
GLOVE EXAM NITRILE XS STR PU (GLOVE) IMPLANT
GOWN STRL REUS W/ TWL LRG LVL3 (GOWN DISPOSABLE) ×2 IMPLANT
GOWN STRL REUS W/ TWL XL LVL3 (GOWN DISPOSABLE) ×1 IMPLANT
GOWN STRL REUS W/TWL 2XL LVL3 (GOWN DISPOSABLE) ×2 IMPLANT
GOWN STRL REUS W/TWL LRG LVL3 (GOWN DISPOSABLE) ×2
GOWN STRL REUS W/TWL XL LVL3 (GOWN DISPOSABLE) ×1
GRAFT BN 5X1XDBM MAGNIFUSE (Bone Implant) ×1 IMPLANT
GRAFT BONE MAGNIFUSE 1X5CM (Bone Implant) ×1 IMPLANT
HEMOSTAT POWDER KIT SURGIFOAM (HEMOSTASIS) ×4 IMPLANT
KIT BASIN OR (CUSTOM PROCEDURE TRAY) ×2 IMPLANT
KIT ROOM TURNOVER OR (KITS) ×2 IMPLANT
NEEDLE HYPO 18GX1.5 BLUNT FILL (NEEDLE) IMPLANT
NEEDLE HYPO 25X1 1.5 SAFETY (NEEDLE) IMPLANT
NEEDLE SPNL 18GX3.5 QUINCKE PK (NEEDLE) ×2 IMPLANT
NS IRRIG 1000ML POUR BTL (IV SOLUTION) ×2 IMPLANT
OIL CARTRIDGE MAESTRO DRILL (MISCELLANEOUS) ×2
PACK LAMINECTOMY NEURO (CUSTOM PROCEDURE TRAY) ×2 IMPLANT
PAD ARMBOARD 7.5X6 YLW CONV (MISCELLANEOUS) ×6 IMPLANT
RUBBERBAND STERILE (MISCELLANEOUS) IMPLANT
SCREW SET SOLERA (Screw) ×2 IMPLANT
SCREW SET SOLERA TI5.5 (Screw) ×2 IMPLANT
SCREW SOLERA 45X6.5XMA NS SPNE (Screw) ×1 IMPLANT
SCREW SOLERA 6.5X45MM (Screw) ×1 IMPLANT
SPONGE LAP 4X18 X RAY DECT (DISPOSABLE) IMPLANT
SPONGE SURGIFOAM ABS GEL SZ50 (HEMOSTASIS) IMPLANT
STRIP CLOSURE SKIN 1/2X4 (GAUZE/BANDAGES/DRESSINGS) IMPLANT
SUT VIC AB 0 CT1 18XCR BRD8 (SUTURE) ×2 IMPLANT
SUT VIC AB 0 CT1 8-18 (SUTURE) ×2
SUT VIC AB 2-0 CT1 18 (SUTURE) IMPLANT
SUT VICRYL 3-0 RB1 18 ABS (SUTURE) ×6 IMPLANT
SYR 3ML LL SCALE MARK (SYRINGE) IMPLANT
TOWEL GREEN STERILE (TOWEL DISPOSABLE) ×2 IMPLANT
TOWEL GREEN STERILE FF (TOWEL DISPOSABLE) ×2 IMPLANT
WATER STERILE IRR 1000ML POUR (IV SOLUTION) ×2 IMPLANT

## 2017-09-01 NOTE — Anesthesia Postprocedure Evaluation (Signed)
Anesthesia Post Note  Patient: Sean BenderMichael Murillo  Procedure(s) Performed: REVISION ON LEFT LUMBAR FIVE SCREW (N/A Spine Lumbar)     Patient location during evaluation: PACU Anesthesia Type: General Level of consciousness: awake and alert Pain management: pain level controlled Vital Signs Assessment: post-procedure vital signs reviewed and stable Respiratory status: spontaneous breathing, nonlabored ventilation, respiratory function stable and patient connected to nasal cannula oxygen Cardiovascular status: blood pressure returned to baseline and stable Postop Assessment: no apparent nausea or vomiting Anesthetic complications: no    Last Vitals:  Vitals:   09/01/17 1413 09/01/17 1601  BP: 132/76 (!) 145/74  Pulse: 88 100  Resp: 16 18  Temp: 36.8 C 36.6 C  SpO2: 95% 96%    Last Pain:  Vitals:   09/01/17 1601  TempSrc: Oral  PainSc: 4                  Omid Deardorff COKER
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
## 2017-09-01 NOTE — Op Note (Signed)
PREOP DIAGNOSIS:  Lumbago Malposition of left L5 screw   POSTOP DIAGNOSIS: Same  PROCEDURE: 1. Removal and revision of left L5 screw 2. Posterolateral arthrodesis 3. Use of morcellized bone allograft  SURGEON: Dr. Lisbeth RenshawNeelesh Haly Feher, MD  ASSISTANT: Dr. Temple PaciniHenry A Pool, MD  ANESTHESIA: General Endotracheal  EBL: 150cc  SPECIMENS: None  DRAINS: None  COMPLICATIONS: None immediate  CONDITION: Stable to PACU  HISTORY: Sean BenderMichael Murillo is a 42 y.o. male who previously underwent L5-S1 fusion.  Although he had significant improvement in his leg pain postoperatively, over the last 6 months he has had continued left-sided back pain.  CT scan has demonstrated malposition of the left L5 screw.  He therefore elected to proceed with removal and revision of that screw.  The risks and benefits of the surgery were explained in detail to the patient and his family.  All questions were answered.  PROCEDURE IN DETAIL: After informed consent was obtained and witnessed, the patient was brought to the operating room. After induction of general anesthesia, the patient was positioned on the operative table in the prone position. All pressure points were meticulously padded.  Previous skin incision was then marked out and prepped and draped in the usual sterile fashion.  After timeout was conducted, the previous incision was opened sharply.  Bovie electrocautery was used to dissect through subcutaneous tissue until the lumbodorsal fascia was identified and opened in the midline.  The spinous process at L4 was then identified, and musculature was elevated in the subperiosteal plane laterally on the left side.  There did appear to be thin bony overgrowth of the left-sided L5 and S1 screws.  Pressure on this fusion mass did elicit movement indicating no significant segmental fusion.  I therefore elected to proceed with removal of the screw and replacement.  Using a combination of high-speed drill and Rogers, the  thin bony overgrowth was removed to identify the left-sided L5 and S1 screws as well as the rod.  The rod was then removed, after set screws were removed.  The left L5 screws was then backed out.  A probe was used in the screw hole, and it did appear that the screw trajectory had breached the pedicle medially.  I therefore used a high-speed drill to create a new pilot hole more inferiorly and laterally, and trajectory was confirmed with AP and lateral fluoroscopy.  The new pilot hole was then tapped to 5.5 mm, and a new 5.5 x 45 mm screw was then placed.  Final position was confirmed with AP and lateral fluoroscopy.  At this point, the lateral facet complex between L5 and S1 was then decorticated with high-speed drill.  Rod and set screws were then placed across L5 and S1.  5 cm bag of Magnifuse was then placed in the lateral gutter.  At this point the wound is irrigated with copious amounts of normal saline irrigation.  The wound was then closed in multiple layers with a combination of 0 and 3-0 Vicryl stitches.  Skin was closed with Dermabond.  Sterile dressing was applied.  The patient was then transferred to the stretcher, extubated, and taken to the postanesthesia care unit in stable hemodynamic condition.  At the end of the case all sponge, needle, instrument, and cottonoid counts were correct.

## 2017-09-01 NOTE — Anesthesia Procedure Notes (Signed)
Procedure Name: Intubation Date/Time: 09/01/2017 10:26 AM Performed by: Kipp BroodJoslin, David, MD Pre-anesthesia Checklist: Patient identified, Emergency Drugs available, Suction available and Patient being monitored Patient Re-evaluated:Patient Re-evaluated prior to induction Oxygen Delivery Method: Circle system utilized Preoxygenation: Pre-oxygenation with 100% oxygen Induction Type: IV induction Ventilation: Oral airway inserted - appropriate to patient size, Two handed mask ventilation required and Mask ventilation with difficulty Laryngoscope Size: Glidescope and 4 Grade View: Grade I Tube type: Oral Tube size: 7.5 mm Number of attempts: 1 Airway Equipment and Method: Stylet and Video-laryngoscopy Placement Confirmation: ETT inserted through vocal cords under direct vision,  positive ETCO2 and breath sounds checked- equal and bilateral Secured at: 23 cm Tube secured with: Tape Dental Injury: Teeth and Oropharynx as per pre-operative assessment

## 2017-09-01 NOTE — Transfer of Care (Signed)
Immediate Anesthesia Transfer of Care Note  Patient: Sean BenderMichael Murillo  Procedure(s) Performed: REVISION ON LEFT LUMBAR FIVE SCREW (N/A Spine Lumbar)  Patient Location: PACU  Anesthesia Type:General  Level of Consciousness: awake, alert  and oriented  Airway & Oxygen Therapy: Patient Spontanous Breathing and Patient connected to face mask oxygen  Post-op Assessment: Report given to RN, Post -op Vital signs reviewed and stable and Patient moving all extremities X 4  Post vital signs: Reviewed and stable  Last Vitals:  Vitals:   09/01/17 0802  BP: 112/85  Pulse: 100  Resp: 20  Temp: 36.7 C  SpO2: 96%    Last Pain:  Vitals:   09/01/17 0802  TempSrc: Oral         Complications: No apparent anesthesia complications

## 2017-09-01 NOTE — Plan of Care (Signed)
  Progressing Safety: Ability to remain free from injury will improve 09/01/2017 2113 - Progressing by Mel AlmondAguilar, Nelva Hauk D, RN Activity: Ability to avoid complications of mobility impairment will improve 09/01/2017 2113 - Progressing by Mel AlmondAguilar, Krisandra Bueno D, RN Ability to tolerate increased activity will improve 09/01/2017 2113 - Progressing by Mel AlmondAguilar, Fredericka Bottcher D, RN Will remain free from falls 09/01/2017 2113 - Progressing by Threasa BeardsAguilar, Cleburne Savini D, RN Bowel/Gastric: Gastrointestinal status for postoperative course will improve 09/01/2017 2113 - Progressing by Mel AlmondAguilar, Elody Kleinsasser D, RN Education: Ability to verbalize activity precautions or restrictions will improve 09/01/2017 2113 - Progressing by Threasa BeardsAguilar, Mardelle Pandolfi D, RN Knowledge of the prescribed therapeutic regimen will improve 09/01/2017 2113 - Progressing by Mel AlmondAguilar, Floy Angert D, RN Understanding of discharge needs will improve 09/01/2017 2113 - Progressing by Mel AlmondAguilar, Odas Ozer D, RN Physical Regulation: Ability to maintain clinical measurements within normal limits will improve 09/01/2017 2113 - Progressing by Cephus RicherAguilar, Jolee Critcher D, RN Postoperative complications will be avoided or minimized 09/01/2017 2113 - Progressing by Threasa BeardsAguilar, Brigham Cobbins D, RN Diagnostic test results will improve 09/01/2017 2113 - Progressing by Threasa BeardsAguilar, Opal Mckellips D, RN Pain Management: Pain level will decrease 09/01/2017 2113 - Progressing by Mel AlmondAguilar, Acelyn Basham D, RN Skin Integrity: Signs of wound healing will improve 09/01/2017 2113 - Progressing by Threasa BeardsAguilar, Shauniece Kwan D, RN Health Behavior/Discharge Planning: Identification of resources available to assist in meeting health care needs will improve 09/01/2017 2113 - Progressing by Mel AlmondAguilar, Rosemae Mcquown D, RN Bladder/Genitourinary: Urinary functional status for postoperative course will improve 09/01/2017 2113 - Progressing by Mel AlmondAguilar, Izzabell Klasen D, RN
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
## 2017-09-02 ENCOUNTER — Encounter (HOSPITAL_COMMUNITY): Payer: Self-pay | Admitting: Neurosurgery

## 2017-09-02 DIAGNOSIS — T8484XA Pain due to internal orthopedic prosthetic devices, implants and grafts, initial encounter: Secondary | ICD-10-CM | POA: Diagnosis not present

## 2017-09-02 LAB — BASIC METABOLIC PANEL
Anion gap: 10 (ref 5–15)
BUN: 10 mg/dL (ref 6–20)
CO2: 24 mmol/L (ref 22–32)
Calcium: 8.6 mg/dL — ABNORMAL LOW (ref 8.9–10.3)
Chloride: 100 mmol/L — ABNORMAL LOW (ref 101–111)
Creatinine, Ser: 1.02 mg/dL (ref 0.61–1.24)
GFR calc Af Amer: >60 mL/min (ref >60)
GFR calc non Af Amer: >60 mL/min (ref >60)
Glucose, Bld: 153 mg/dL — ABNORMAL HIGH (ref 65–99)
Potassium: 4 mmol/L (ref 3.5–5.1)
Sodium: 134 mmol/L — ABNORMAL LOW (ref 135–145)

## 2017-09-02 LAB — PROTIME-INR
INR: 1.11
Prothrombin Time: 14.2 seconds (ref 11.4–15.2)

## 2017-09-02 LAB — CBC
HCT: 37.6 % — ABNORMAL LOW (ref 39.0–52.0)
Hemoglobin: 13.3 g/dL (ref 13.0–17.0)
MCH: 28.4 pg (ref 26.0–34.0)
MCHC: 35.4 g/dL (ref 30.0–36.0)
MCV: 80.2 fL (ref 78.0–100.0)
Platelets: 372 10*3/uL (ref 150–400)
RBC: 4.69 MIL/uL (ref 4.22–5.81)
RDW: 15.5 % (ref 11.5–15.5)
WBC: 18 10*3/uL — ABNORMAL HIGH (ref 4.0–10.5)

## 2017-09-02 LAB — APTT: aPTT: 31 seconds (ref 24–36)

## 2017-09-02 MED FILL — Thrombin For Soln 5000 Unit: CUTANEOUS | Qty: 5000 | Status: AC

## 2017-09-02 NOTE — Discharge Summary (Signed)
Physician Discharge Summary  Patient ID: Sean Murillo MRN: [id number] DOB/AGE: 1976/02/13 42 y.o.  Admit date: 09/01/2017 Discharge date: 09/02/2017  Admission Diagnoses:  Pain from implanted hardware  Discharge Diagnoses:  Same Active Problems:   Pain from implanted hardware  Discharged Condition: Stable  Hospital Course:  Sean Murillo is a 42 y.o. male who was admitted for the below procedure. There were no post operative complications. At time of discharge, pain was well controlled, ambulating with Pt/OT, tolerating po, voiding normal. Ready for discharge.  Treatments: Surgery 1. Removal and revision of left L5 screw 2. Posterolateral arthrodesis 3. Use of morcellized bone allograft  Discharge Exam: Blood pressure 100/70, pulse 74, temperature 97.9 F (36.6 C), resp. rate 18, height 5\' 9"  (1.753 m), weight (!) 170.1 kg (375 lb), SpO2 95 %. Awake, alert, oriented Speech fluent, appropriate CN grossly intact 5/5 BUE/BLE Wound c/d/i  Disposition: 01-Home or Self Care  Discharge Instructions    Call MD for:  difficulty breathing, headache or visual disturbances   Complete by:  As directed    Call MD for:  persistant dizziness or light-headedness   Complete by:  As directed    Call MD for:  redness, tenderness, or signs of infection (pain, swelling, redness, odor or green/yellow discharge around incision site)   Complete by:  As directed    Call MD for:  severe uncontrolled pain   Complete by:  As directed    Call MD for:  temperature >100.4   Complete by:  As directed    Diet general   Complete by:  As directed    Driving Restrictions   Complete by:  As directed    Do not drive until given clearance.   Increase activity slowly   Complete by:  As directed    Lifting restrictions   Complete by:  As directed    Do not lift anything >10lbs. Avoid bending and twisting in awkward positions. Avoid bending at the back.   May shower / Bathe   Complete by:  As  directed    In 24 hours. Okay to wash wound with warm soapy water. Avoid scrubbing the wound. Pat dry.   Remove dressing in 24 hours   Complete by:  As directed      Allergies as of 09/02/2017      Reactions   Contrast Media [iodinated Diagnostic Agents] Hives   Iohexol Hives   Needs pre meds   Shellfish Allergy Hives      Medication List    STOP taking these medications   colchicine 0.6 MG tablet   HYDROcodone-acetaminophen 5-325 MG tablet Commonly known as:  NORCO/VICODIN   HYDROcodone-acetaminophen 7.5-325 MG tablet Commonly known as:  NORCO   indomethacin 25 MG capsule Commonly known as:  INDOCIN   methocarbamol 500 MG tablet Commonly known as:  ROBAXIN   ondansetron 4 MG tablet Commonly known as:  ZOFRAN   oxyCODONE-acetaminophen 5-325 MG tablet Commonly known as:  ROXICET     TAKE these medications   furosemide 20 MG tablet Commonly known as:  LASIX Take 1 tablet (20 mg total) by mouth daily.   lisinopril 20 MG tablet Commonly known as:  PRINIVIL,ZESTRIL Take 20 mg by mouth daily.   sertraline 50 MG tablet Commonly known as:  ZOLOFT Take 50 mg by mouth daily.   VITAMIN D PO Take 1 capsule by mouth daily.      Follow-up Information    Lisbeth Renshaw, MD. Schedule an appointment as soon as  possible for a visit in 3 week(s).   Specialty:  Neurosurgery Contact information: [address] [place] [address] [phone]           Signed: Alyson Ingles 09/02/2017, 8:59 AM
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
## 2017-09-02 NOTE — Progress Notes (Signed)
Pt and wife given D/C instructions with verbal understanding. Rx's were sent to Pt's pharmacy by MD. Pt's incision is clean and dry with no sign of infection. Pt's IV was removed prior to D/C. Pt D/C'd home via wheelchair @ 1005 per MD order. Pt is stable @ D/C and has no other needs at this time. Rema FendtAshley Fleeta Kunde, RN
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
## 2017-09-12 ENCOUNTER — Emergency Department (HOSPITAL_COMMUNITY)
Admission: EM | Admit: 2017-09-12 | Discharge: 2017-09-12 | Disposition: A | Discharge status: Home / Self Care | Payer: 59 | Attending: Emergency Medicine | Admitting: Emergency Medicine

## 2017-09-12 ENCOUNTER — Other Ambulatory Visit: Payer: Self-pay

## 2017-09-12 ENCOUNTER — Encounter (HOSPITAL_COMMUNITY): Payer: Self-pay

## 2017-09-12 DIAGNOSIS — I10 Essential (primary) hypertension: Secondary | ICD-10-CM | POA: Diagnosis not present

## 2017-09-12 DIAGNOSIS — Z79899 Other long term (current) drug therapy: Secondary | ICD-10-CM | POA: Diagnosis not present

## 2017-09-12 DIAGNOSIS — Z4801 Encounter for change or removal of surgical wound dressing: Secondary | ICD-10-CM | POA: Insufficient documentation

## 2017-09-12 DIAGNOSIS — Z9889 Other specified postprocedural states: Secondary | ICD-10-CM

## 2017-09-12 NOTE — Discharge Instructions (Signed)
You were seen here today for evaluation of your postoperative incision.  This appears to be healing well.  Please continue home medications as prescribed.  Keep any follow-up appointments with Dr. Lucianne MussKumar.  He is to call his office tomorrow and let them know that you were seen here today.  Return for any fevers, trauma, drainage from the incision site, spreading redness around the incision site or other concerns.

## 2017-09-12 NOTE — ED Triage Notes (Signed)
Pt presents to the ed with complaints of having back surgery march 5, states that his family member saw that his incision looked to be slightly opened.  Pt denies any increase in pain.
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
## 2017-12-03 ENCOUNTER — Emergency Department (HOSPITAL_COMMUNITY): Payer: 59

## 2017-12-03 ENCOUNTER — Emergency Department (HOSPITAL_COMMUNITY)
Admission: EM | Admit: 2017-12-03 | Discharge: 2017-12-03 | Disposition: A | Discharge status: Home / Self Care | Payer: 59 | Attending: Emergency Medicine | Admitting: Emergency Medicine

## 2017-12-03 ENCOUNTER — Other Ambulatory Visit: Payer: Self-pay

## 2017-12-03 ENCOUNTER — Encounter (HOSPITAL_COMMUNITY): Payer: Self-pay | Admitting: Emergency Medicine

## 2017-12-03 DIAGNOSIS — S99911A Unspecified injury of right ankle, initial encounter: Secondary | ICD-10-CM | POA: Diagnosis present

## 2017-12-03 DIAGNOSIS — Z79899 Other long term (current) drug therapy: Secondary | ICD-10-CM | POA: Diagnosis not present

## 2017-12-03 DIAGNOSIS — Y33XXXA Other specified events, undetermined intent, initial encounter: Secondary | ICD-10-CM | POA: Insufficient documentation

## 2017-12-03 DIAGNOSIS — Y998 Other external cause status: Secondary | ICD-10-CM | POA: Diagnosis not present

## 2017-12-03 DIAGNOSIS — Y939 Activity, unspecified: Secondary | ICD-10-CM | POA: Diagnosis not present

## 2017-12-03 DIAGNOSIS — S93401A Sprain of unspecified ligament of right ankle, initial encounter: Secondary | ICD-10-CM | POA: Insufficient documentation

## 2017-12-03 DIAGNOSIS — Y92480 Sidewalk as the place of occurrence of the external cause: Secondary | ICD-10-CM | POA: Diagnosis not present

## 2017-12-03 DIAGNOSIS — I10 Essential (primary) hypertension: Secondary | ICD-10-CM | POA: Diagnosis not present

## 2017-12-03 MED ORDER — HYDROCODONE-ACETAMINOPHEN 5-325 MG PO TABS
1.0000 | ORAL_TABLET | Freq: Once | ORAL | Status: AC
Start: 2017-12-03 — End: 2017-12-03
  Administered 2017-12-03: 1 via ORAL
  Filled 2017-12-03: qty 1

## 2017-12-03 NOTE — ED Triage Notes (Signed)
Pt twisted right ankle pta. Pt c/o pain to lateral right ankle. Pt obese. ble swelilng noted.

## 2017-12-03 NOTE — Discharge Instructions (Signed)
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
## 2017-12-06 ENCOUNTER — Emergency Department (HOSPITAL_COMMUNITY): Payer: 59

## 2017-12-06 ENCOUNTER — Emergency Department (HOSPITAL_COMMUNITY)
Admission: EM | Admit: 2017-12-06 | Discharge: 2017-12-06 | Disposition: A | Discharge status: Home / Self Care | Payer: 59 | Attending: Emergency Medicine | Admitting: Emergency Medicine

## 2017-12-06 ENCOUNTER — Encounter (HOSPITAL_COMMUNITY): Payer: Self-pay | Admitting: Emergency Medicine

## 2017-12-06 DIAGNOSIS — Y939 Activity, unspecified: Secondary | ICD-10-CM | POA: Insufficient documentation

## 2017-12-06 DIAGNOSIS — Z79899 Other long term (current) drug therapy: Secondary | ICD-10-CM | POA: Insufficient documentation

## 2017-12-06 DIAGNOSIS — Y33XXXA Other specified events, undetermined intent, initial encounter: Secondary | ICD-10-CM | POA: Insufficient documentation

## 2017-12-06 DIAGNOSIS — S99911A Unspecified injury of right ankle, initial encounter: Secondary | ICD-10-CM | POA: Diagnosis present

## 2017-12-06 DIAGNOSIS — I10 Essential (primary) hypertension: Secondary | ICD-10-CM | POA: Diagnosis not present

## 2017-12-06 DIAGNOSIS — Y998 Other external cause status: Secondary | ICD-10-CM | POA: Diagnosis not present

## 2017-12-06 DIAGNOSIS — Y929 Unspecified place or not applicable: Secondary | ICD-10-CM | POA: Diagnosis not present

## 2017-12-06 DIAGNOSIS — S93401A Sprain of unspecified ligament of right ankle, initial encounter: Secondary | ICD-10-CM | POA: Diagnosis not present

## 2017-12-06 NOTE — ED Notes (Signed)
Ortho paged. 

## 2017-12-06 NOTE — ED Notes (Signed)
Patient transported to X-ray 

## 2017-12-06 NOTE — ED Triage Notes (Signed)
Pt reports rt ankle injury and swelling after stepping on dog toy at home this morning. Pt reports no relief from ice therapy or Tylenol. Pain 10/10.

## 2017-12-06 NOTE — Progress Notes (Signed)
Orthopedic Tech Progress Note Patient Details:  Sean BenderMichael Murillo 12/02/1975 [id number]  Ortho Devices Type of Ortho Device: CAM walker Ortho Device/Splint Location: RLE Ortho Device/Splint Interventions: Ordered, Application   Post Interventions Patient Tolerated: Well Instructions Provided: Care of device   [name], Chrisette Man Craig 12/06/2017, 6:09 PM
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
## 2017-12-06 NOTE — ED Notes (Signed)
Ortho bedside

## 2018-03-11 IMAGING — CT CT ABD-PELV W/O CM
2 of 4 series · 16 of 46 positions shown, 18 images · non-contrast
Comparison: CT abdomen and pelvis February 09, 2017

CLINICAL DATA: Abdominal pain for 1 hour. History of hernia repair.

EXAM:
CT ABDOMEN AND PELVIS WITHOUT CONTRAST
TECHNIQUE: Multidetector CT imaging of the abdomen and pelvis was performed
following the standard protocol without IV contrast.

[Series 2: axial st · axial · 0.98mm/px · z∈[+699,+1224]mm · 13 of 115 slices shown, 15 images]
[im 5/115  soft-tissue]
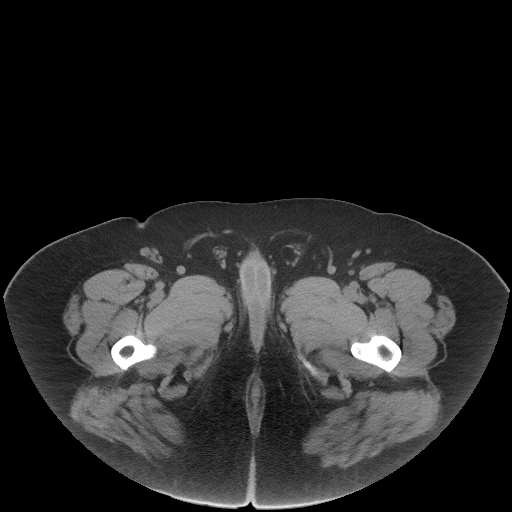
[im 5/115  bone]
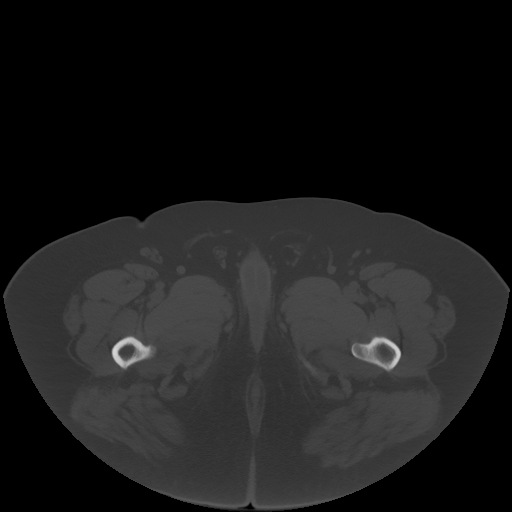
[im 15/115  soft-tissue]
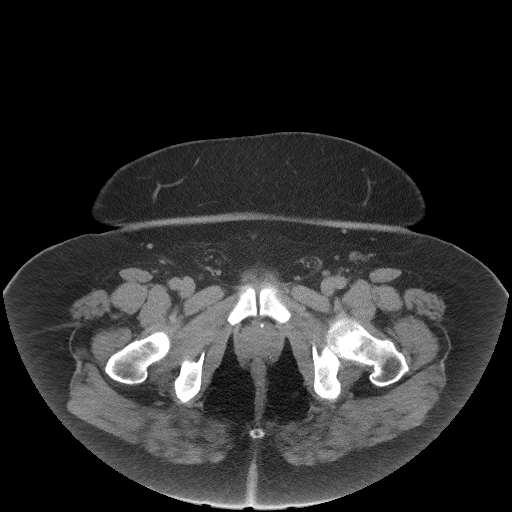
[im 25/115  soft-tissue]
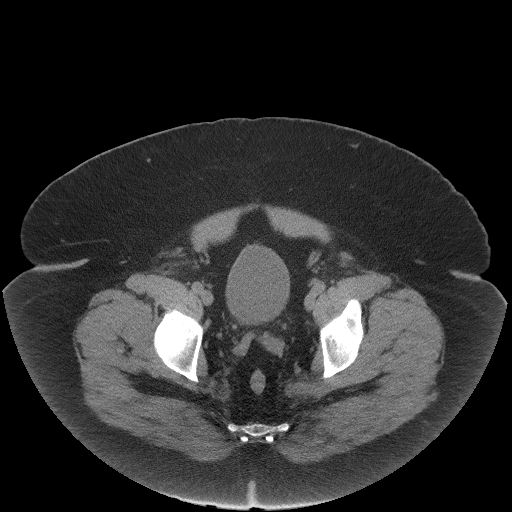
[im 30/115  soft-tissue]
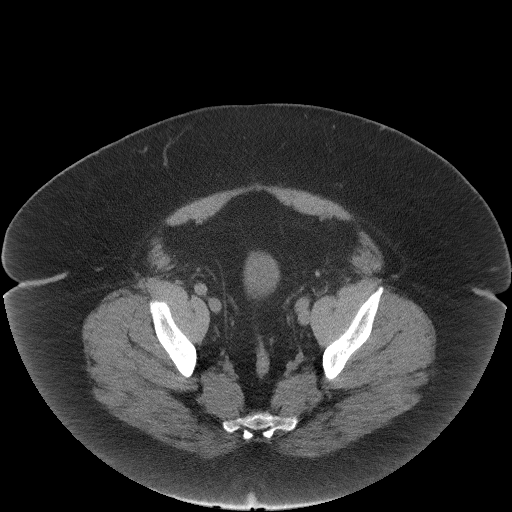
[im 40/115  soft-tissue]
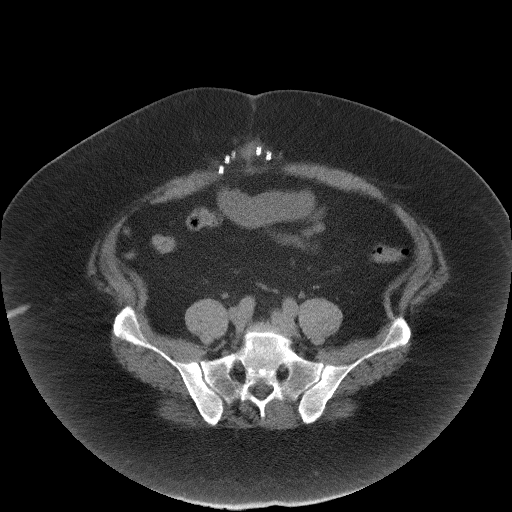
[im 50/115  soft-tissue]
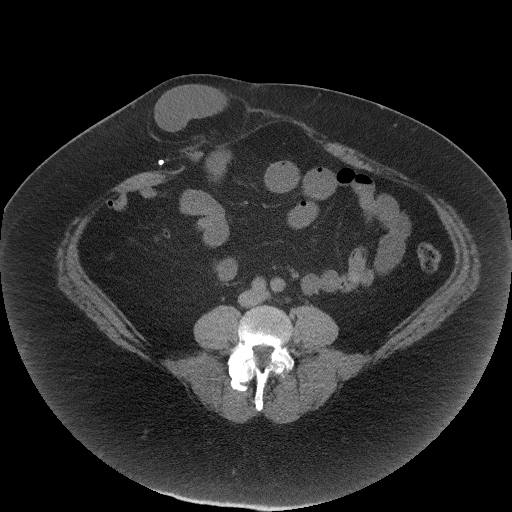
[im 60/115  soft-tissue]
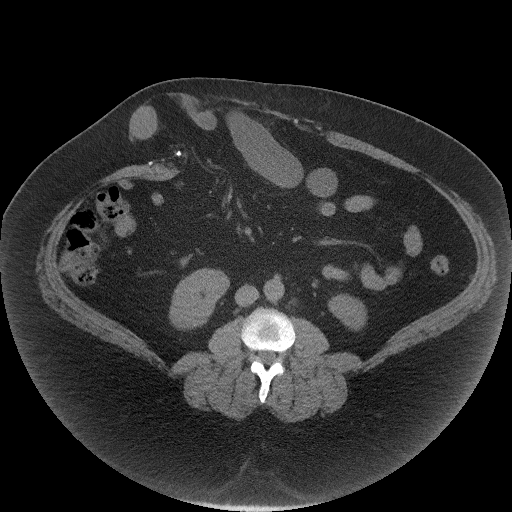
[im 65/115  soft-tissue]
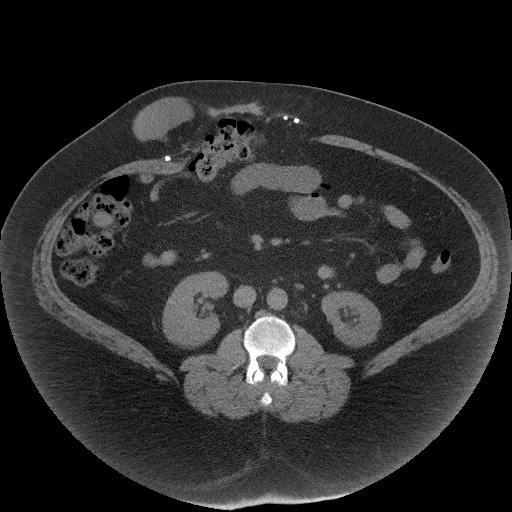
[im 75/115  soft-tissue]
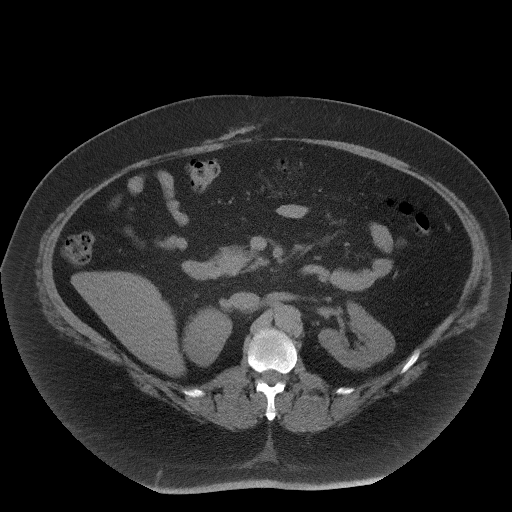
[im 75/115  bone]
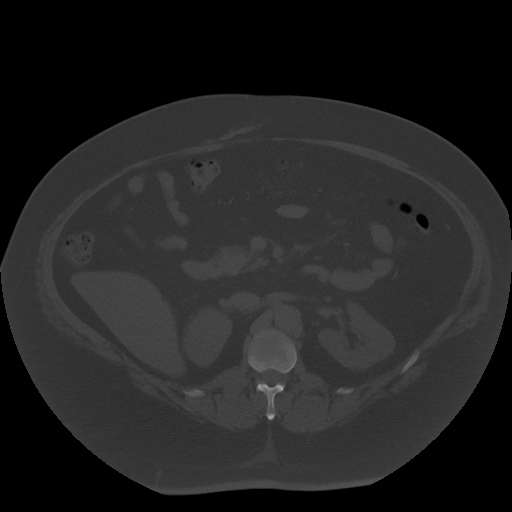
[im 85/115  soft-tissue]
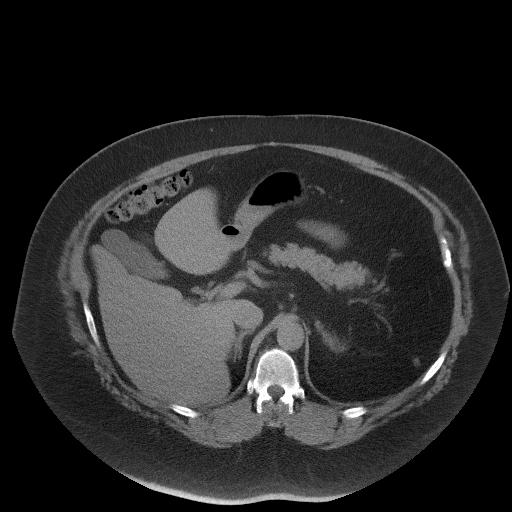
[im 90/115  soft-tissue]
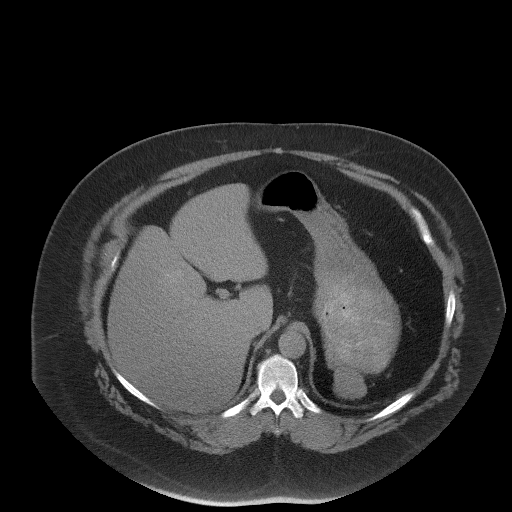
[im 100/115  soft-tissue]
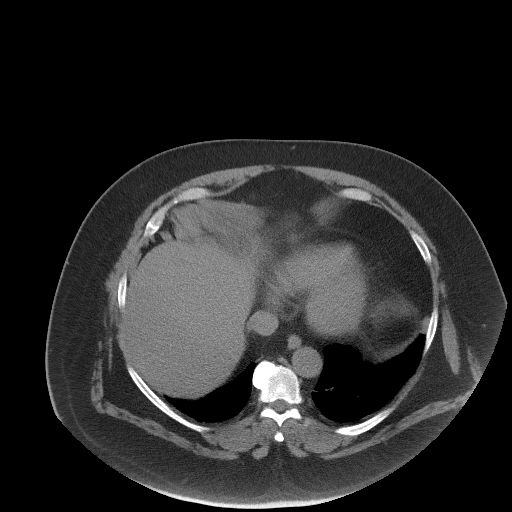
[im 110/115  soft-tissue]
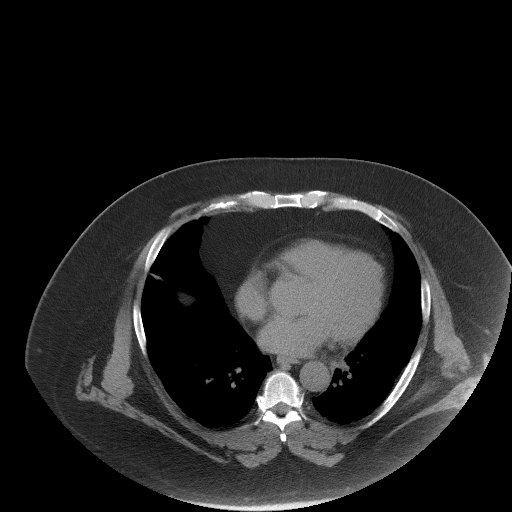

[Series 5: coronal st · coronal · 1.01mm/px · 3 of 132 slices shown]
[im 44/132  soft-tissue]
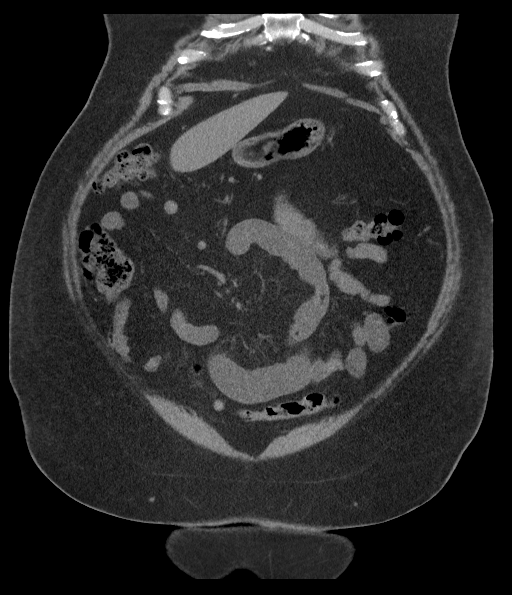
[im 59/132  soft-tissue]
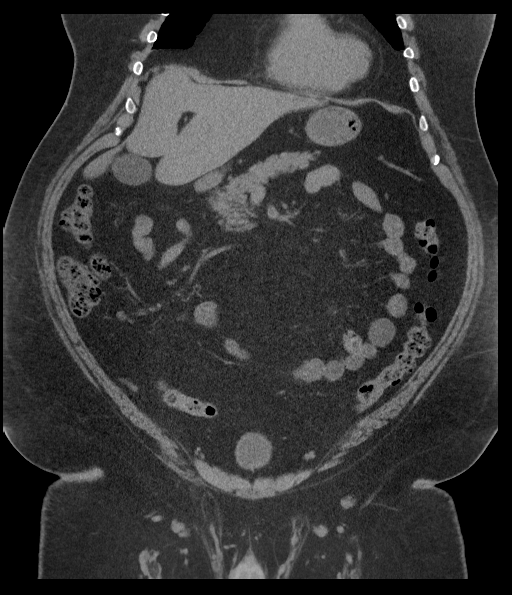
[im 73/132  soft-tissue]
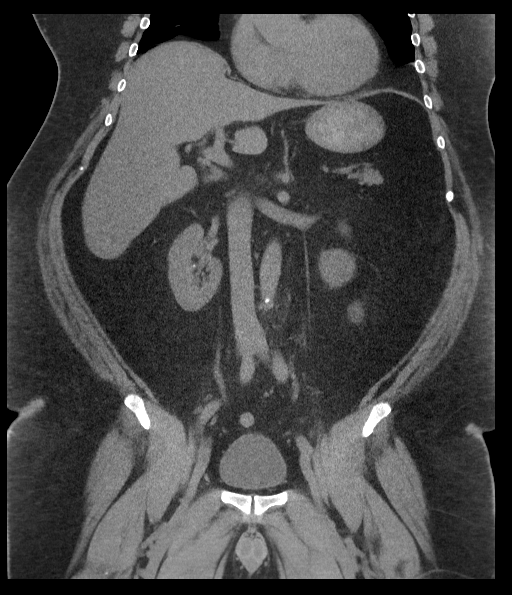

[16 of 46 positions shown; findings below may reference images not displayed]

FINDINGS: Large body habitus results in overall noisy image quality.

LOWER CHEST: Bibasilar atelectasis. The visualized heart size is
normal. Prominent epicardial fat pad. No pericardial effusion.

HEPATOBILIARY: Normal.

PANCREAS: Normal.

SPLEEN: Status post splenectomy.  Splenosis.

ADRENALS/URINARY TRACT: Kidneys are orthotopic, demonstrating normal
size and morphology. No nephrolithiasis, hydronephrosis; limited
assessment for renal masses on this nonenhanced examination. The
unopacified ureters are normal in course and caliber. Urinary
bladder is partially distended and unremarkable. Normal adrenal
glands.

STOMACH/BOWEL: In anterior abdominal wall ligamentous laxity. Two
adjacent wide necked ventral hernias, smaller neck is 4.2 cm. Small
bowel dilated to 3 cm proximal to the hernia sac. Small bowel
courses into the hernia sac with multiple transition points as the
small bowel courses in and out of the sacs. Additional fat
containing ventral hernia without small bowel. Mild colonic
diverticulosis. Anti mesenteric transverse colon within hernia sac.

VASCULAR/LYMPHATIC: Aortoiliac vessels are normal in course and
caliber. Mild calcific atherosclerosis. No lymphadenopathy by CT
size criteria.

REPRODUCTIVE: Normal.

OTHER: No intraperitoneal free fluid or free air. Phleboliths in the
pelvis.

MUSCULOSKELETAL: Non-acute. Status post abdominal wall
herniorrhaphy, rectus abdominis diastases. Small fat containing
inguinal hernias.

Chronic bilateral L5 pars interarticularis defects with minimal
grade 1 L5-S1 anterolisthesis.
IMPRESSION: Early/low-grade small bowel obstruction with transition point
associated with wide necked ventral hernias, possible adhesions. No
CT findings of incarceration or strangulation. Status post abdominal
wall herniorrhaphy.

Aortic Atherosclerosis ([id number]-[id number].U).

## 2018-03-22 ENCOUNTER — Other Ambulatory Visit (HOSPITAL_COMMUNITY): Payer: Self-pay | Admitting: Physician Assistant

## 2018-03-22 DIAGNOSIS — T85848D Pain due to other internal prosthetic devices, implants and grafts, subsequent encounter: Secondary | ICD-10-CM

## 2018-04-08 ENCOUNTER — Ambulatory Visit (HOSPITAL_COMMUNITY)
Admission: RE | Admit: 2018-04-08 | Discharge: 2018-04-08 | Disposition: A | Discharge status: Home / Self Care | Payer: 59 | Source: Ambulatory Visit | Attending: Physician Assistant | Admitting: Physician Assistant

## 2018-04-08 DIAGNOSIS — T85848D Pain due to other internal prosthetic devices, implants and grafts, subsequent encounter: Secondary | ICD-10-CM | POA: Diagnosis not present

## 2018-04-15 ENCOUNTER — Other Ambulatory Visit: Payer: Self-pay | Admitting: Physician Assistant

## 2018-04-15 DIAGNOSIS — M545 Low back pain, unspecified: Secondary | ICD-10-CM

## 2018-04-15 DIAGNOSIS — G8929 Other chronic pain: Secondary | ICD-10-CM

## 2018-04-27 ENCOUNTER — Ambulatory Visit
Admission: RE | Admit: 2018-04-27 | Discharge: 2018-04-27 | Disposition: A | Discharge status: Home / Self Care | Payer: 59 | Source: Ambulatory Visit | Attending: Physician Assistant | Admitting: Physician Assistant

## 2018-04-27 DIAGNOSIS — M545 Low back pain, unspecified: Secondary | ICD-10-CM

## 2018-04-27 DIAGNOSIS — G8929 Other chronic pain: Secondary | ICD-10-CM

## 2018-04-27 MED ORDER — METHYLPREDNISOLONE ACETATE 40 MG/ML INJ SUSP (RADIOLOG
120.0000 mg | Freq: Once | INTRAMUSCULAR | Status: AC
  Administered 2018-04-27: 120 mg via INTRA_ARTICULAR

## 2018-04-27 MED ORDER — IOPAMIDOL (ISOVUE-M 200) INJECTION 41%
1.0000 mL | Freq: Once | INTRAMUSCULAR | Status: AC
  Administered 2018-04-27: 1 mL via INTRA_ARTICULAR

## 2018-04-27 NOTE — Discharge Instructions (Signed)

## 2018-05-06 ENCOUNTER — Emergency Department (HOSPITAL_COMMUNITY)
Admission: EM | Admit: 2018-05-06 | Discharge: 2018-05-06 | Disposition: A | Discharge status: Home / Self Care | Payer: Non-veteran care | Attending: Emergency Medicine | Admitting: Emergency Medicine

## 2018-05-06 ENCOUNTER — Encounter (HOSPITAL_COMMUNITY): Payer: Self-pay | Admitting: Emergency Medicine

## 2018-05-06 ENCOUNTER — Emergency Department (HOSPITAL_COMMUNITY): Payer: Non-veteran care

## 2018-05-06 ENCOUNTER — Other Ambulatory Visit: Payer: Self-pay

## 2018-05-06 DIAGNOSIS — F17290 Nicotine dependence, other tobacco product, uncomplicated: Secondary | ICD-10-CM | POA: Diagnosis not present

## 2018-05-06 DIAGNOSIS — I10 Essential (primary) hypertension: Secondary | ICD-10-CM | POA: Diagnosis not present

## 2018-05-06 DIAGNOSIS — Z79899 Other long term (current) drug therapy: Secondary | ICD-10-CM | POA: Diagnosis not present

## 2018-05-06 DIAGNOSIS — M79645 Pain in left finger(s): Secondary | ICD-10-CM | POA: Diagnosis present

## 2018-05-06 MED ORDER — IBUPROFEN 400 MG PO TABS
600.0000 mg | ORAL_TABLET | Freq: Once | ORAL | Status: AC
  Administered 2018-05-06: 600 mg via ORAL
  Filled 2018-05-06: qty 2

## 2018-05-06 NOTE — ED Notes (Signed)
Thumb spica large splint applied to left thumb wrist and hand.

## 2018-05-06 NOTE — ED Notes (Signed)
Patient transported to X-ray 

## 2018-05-06 NOTE — Discharge Instructions (Addendum)
Elevate your hand, use ice packs for comfort.  Take ibuprofen 600 mg and/or acetaminophen 650 mg 4 times a day for pain.  Wear the thumb splint for the next week.  Have an orthopedist check you in about a week to see if your tendons have healed enough for you to stop wearing the splint.
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
## 2018-05-06 NOTE — ED Triage Notes (Signed)
Pt states his dog was running at him and he put his hands out to stop him when his left thumb was hyperextended. Pt C/O pain behind the thumb and into the left wrist.

## 2018-06-24 ENCOUNTER — Emergency Department: Payer: 59

## 2018-06-24 ENCOUNTER — Other Ambulatory Visit: Payer: Self-pay

## 2018-06-24 DIAGNOSIS — Z79899 Other long term (current) drug therapy: Secondary | ICD-10-CM | POA: Diagnosis not present

## 2018-06-24 DIAGNOSIS — I10 Essential (primary) hypertension: Secondary | ICD-10-CM | POA: Diagnosis not present

## 2018-06-24 DIAGNOSIS — M5431 Sciatica, right side: Secondary | ICD-10-CM | POA: Insufficient documentation

## 2018-06-24 DIAGNOSIS — M545 Low back pain: Secondary | ICD-10-CM | POA: Diagnosis present

## 2018-06-24 DIAGNOSIS — M79604 Pain in right leg: Secondary | ICD-10-CM | POA: Insufficient documentation

## 2018-06-24 NOTE — ED Triage Notes (Addendum)
Pt arrives to ED via POV from home with c/o right leg pain x4 hrs. Pt denies any recent falls or injury. Pt reports pain starts at the hip and radiates down the entire leg. Pt denies use of anticoagulants, no history of DVT. No c/o N/V/D, no fever, no abdominal pain. CMS intact in affected extremity; right foot pulse present, strong, and palpable.

## 2018-06-25 ENCOUNTER — Emergency Department: Payer: 59

## 2018-06-25 ENCOUNTER — Emergency Department
Admission: EM | Admit: 2018-06-25 | Discharge: 2018-06-25 | Disposition: A | Discharge status: Home / Self Care | Payer: 59 | Attending: Emergency Medicine | Admitting: Emergency Medicine

## 2018-06-25 DIAGNOSIS — M5431 Sciatica, right side: Secondary | ICD-10-CM

## 2018-06-25 MED ORDER — HYDROMORPHONE HCL 1 MG/ML IJ SOLN
INTRAMUSCULAR | Status: AC
  Administered 2018-06-25: 1 mg via INTRAVENOUS
  Filled 2018-06-25: qty 1

## 2018-06-25 MED ORDER — OXYCODONE-ACETAMINOPHEN 5-325 MG PO TABS: 1.0000 | ORAL_TABLET | ORAL | 0 refills | Status: DC | PRN

## 2018-06-25 MED ORDER — MORPHINE SULFATE (PF) 4 MG/ML IV SOLN
4.0000 mg | Freq: Once | INTRAVENOUS | Status: AC
  Administered 2018-06-25: 4 mg via INTRAVENOUS
  Filled 2018-06-25: qty 1

## 2018-06-25 MED ORDER — ONDANSETRON HCL 4 MG/2ML IJ SOLN
4.0000 mg | Freq: Once | INTRAMUSCULAR | Status: AC
  Administered 2018-06-25: 4 mg via INTRAVENOUS
  Filled 2018-06-25: qty 2

## 2018-06-25 MED ORDER — HYDROMORPHONE HCL 1 MG/ML IJ SOLN
1.0000 mg | Freq: Once | INTRAMUSCULAR | Status: AC
  Administered 2018-06-25: 1 mg via INTRAVENOUS

## 2018-06-25 MED ORDER — HYDROMORPHONE HCL 1 MG/ML IJ SOLN
1.0000 mg | Freq: Once | INTRAMUSCULAR | Status: AC
  Administered 2018-06-25: 1 mg via INTRAVENOUS
  Filled 2018-06-25: qty 1
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
## 2018-06-25 NOTE — ED Notes (Signed)
Patient transported to MRI 

## 2018-08-14 IMAGING — CT CT L SPINE W/O CM
3 of 4 series · 11 of 33 positions shown, 13 images · non-contrast
Comparison: Lumbar MRI 10/16/2016, radiographs 03/30/2017

CLINICAL DATA: Spondylolisthesis.  Lumbar fusion 03/13/2017

EXAM:
CT LUMBAR SPINE WITHOUT CONTRAST
TECHNIQUE: Multidetector CT imaging of the lumbar spine was performed without
intravenous contrast administration. Multiplanar CT image
reconstructions were also generated.

[Series 4: l spine soft · axial · 0.34mm/px · z∈[+1052,+1252]mm · 3 of 152 slices shown, 4 images]
[im 26/152  soft-tissue]
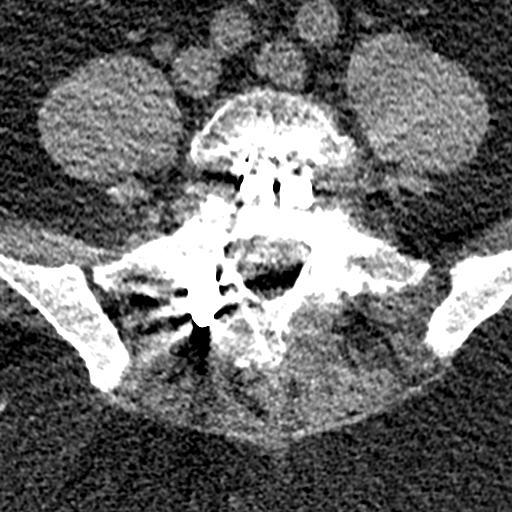
[im 26/152  bone]
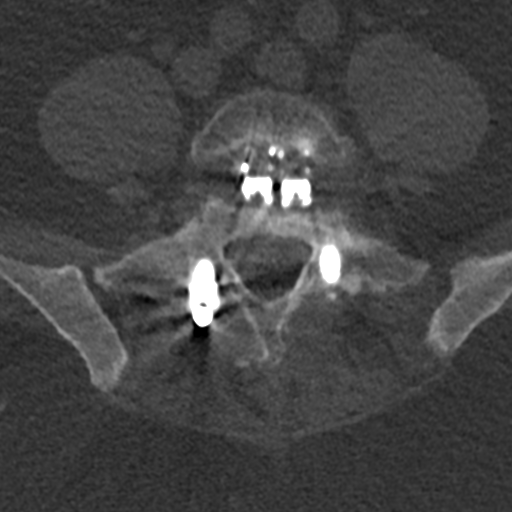
[im 76/152  bone]
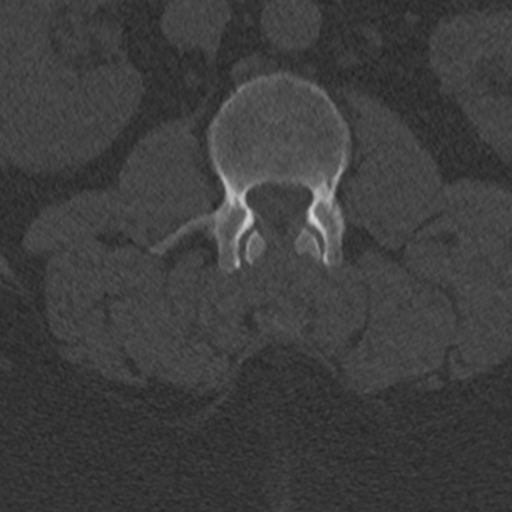
[im 126/152  bone]
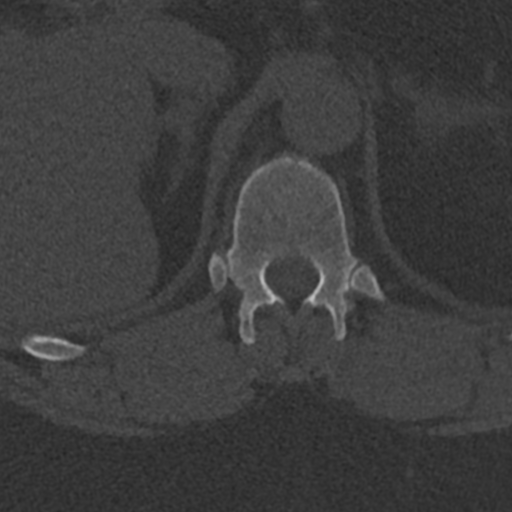

[Series 5: sagittal bone · sagittal · 0.31mm/px · 5 of 74 slices shown, 6 images]
[im 25/74  bone]
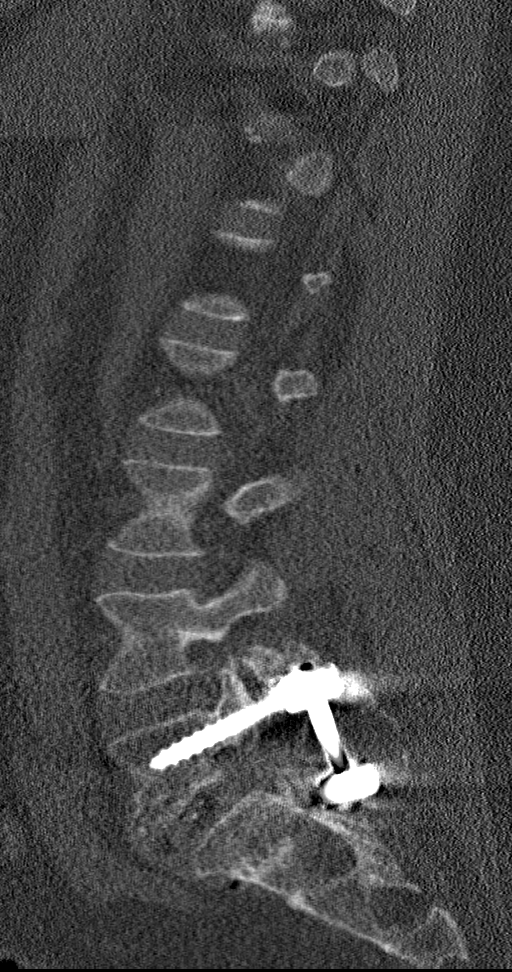
[im 31/74  bone]
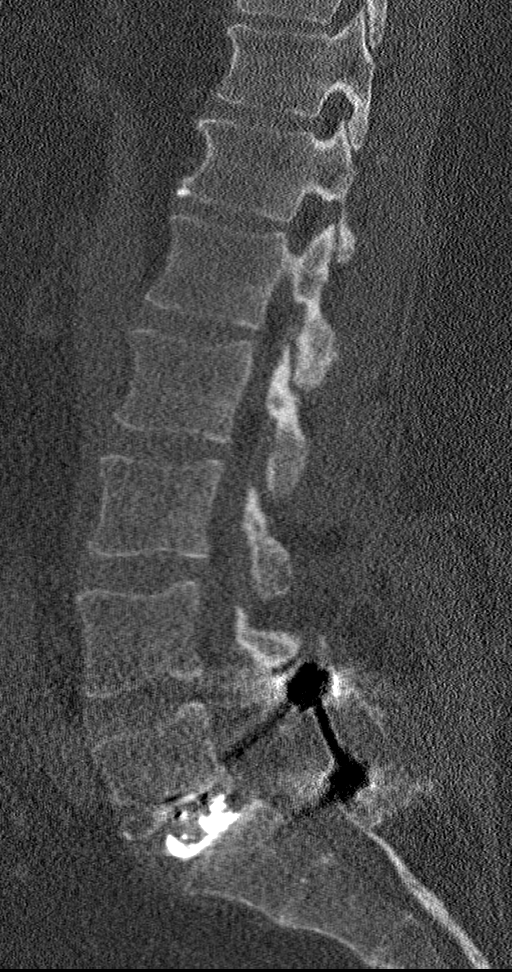
[im 37/74  soft-tissue]
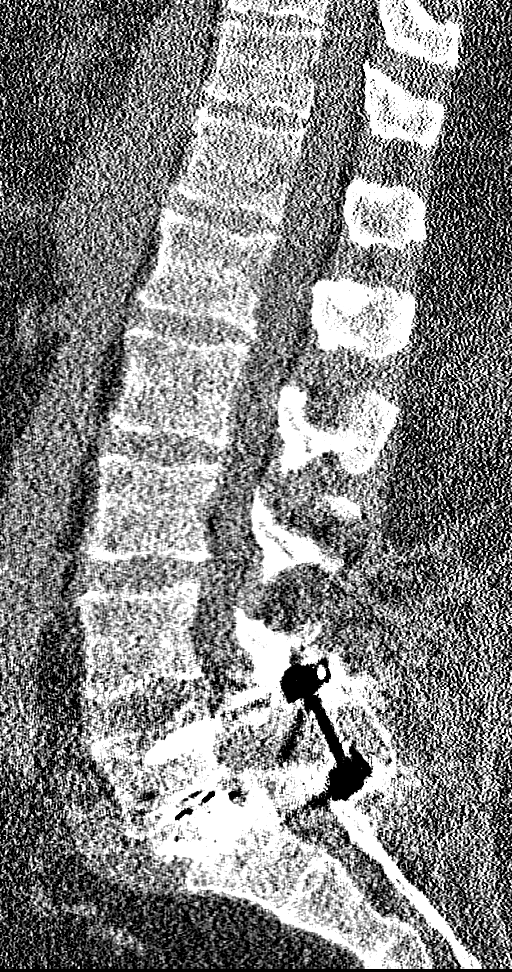
[im 37/74  bone]
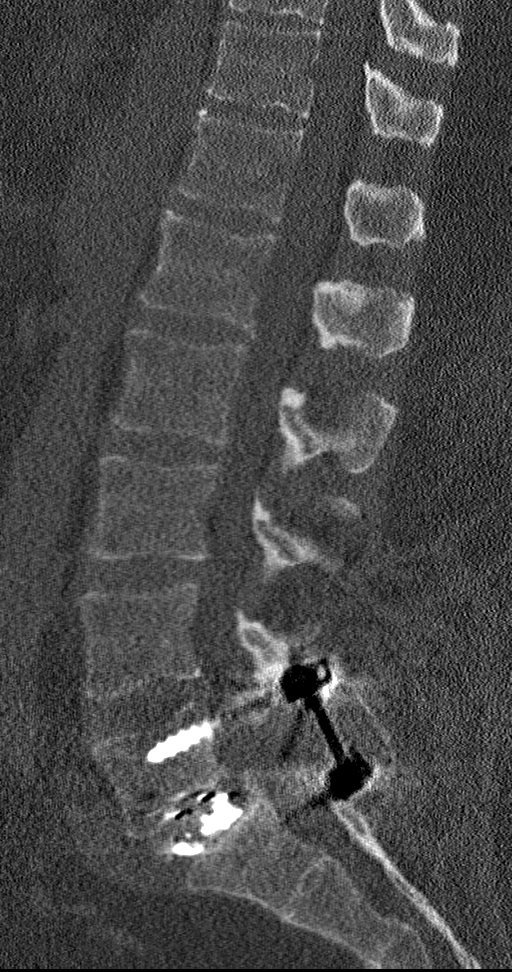
[im 43/74  bone]
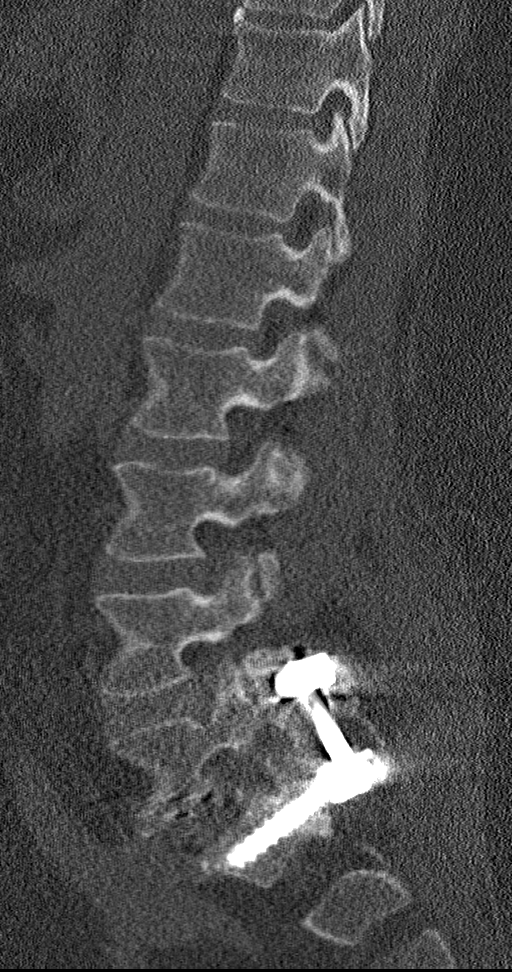
[im 49/74  bone]
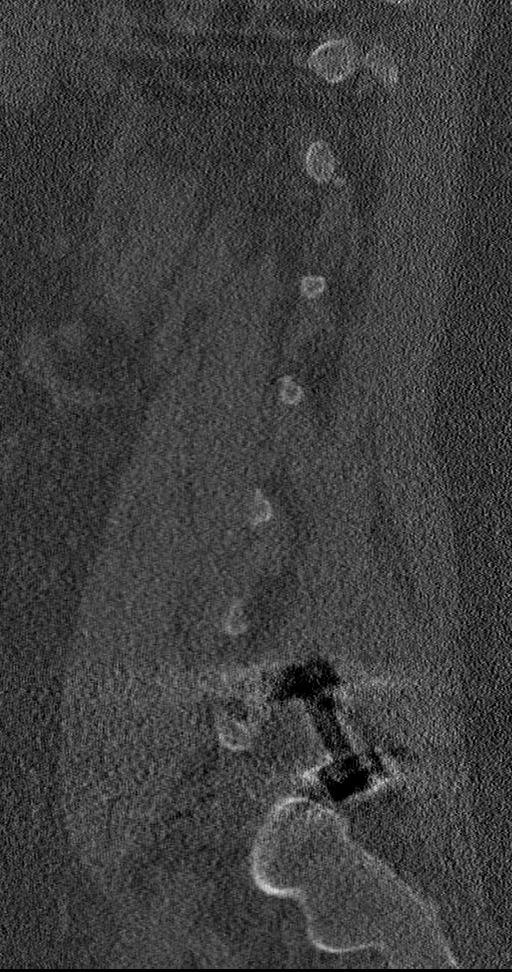

[Series 6: coronal bone · coronal · 0.44mm/px · 3 of 71 slices shown]
[im 15/71  bone]
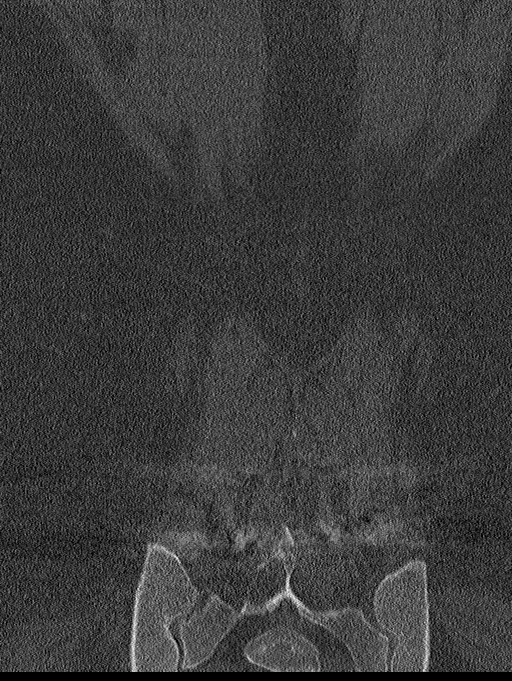
[im 29/71  bone]
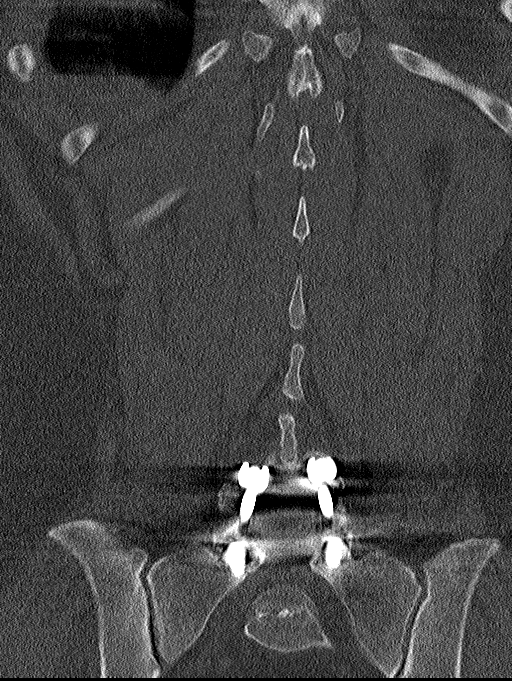
[im 43/71  bone]
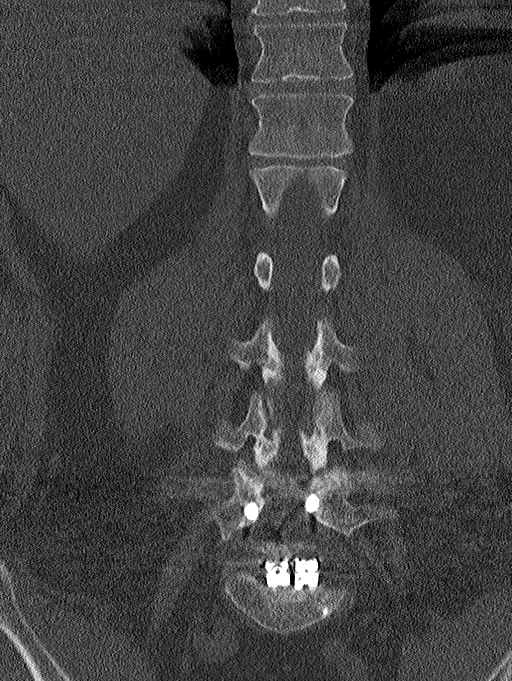

[11 of 33 positions shown; findings below may reference images not displayed]

FINDINGS: Segmentation: Normal

Alignment: Slight retrolisthesis L4-5. 3 mm anterolisthesis L5-S1
unchanged from the prior MRI.

Vertebrae: Negative for fracture or mass.  Interval PLIF L5-S1

Paraspinal and other soft tissues: No paraspinous mass or soft
tissue thickening. No fluid collection

Disc levels: T12-L1: Negative

L1-2: Mild disc degeneration

L2-3: Mild disc degeneration and disc bulging without stenosis

L3-4: Mild disc bulging without stenosis

L4-5: Advanced facet degeneration bilaterally with bony overgrowth
of the facet joints. Mild disc degeneration. Spinal canal difficult
to evaluate due to artifact from hardware. No definite stenosis

L5-S1: Bilateral pedicle screw and interbody fusion. Posterior bone
graft appears solid. Posterior decompression. Interbody bone graft
difficult to evaluate due to artifact from metal spacer. No
significant stenosis.
IMPRESSION: Advanced facet degeneration L4-5.  No significant stenosis.

PLIF L5-S1. No significant stenosis. Solid posterior fusion.
Interbody fusion difficult to evaluate due to artifact from metal
spacers.

## 2018-08-31 ENCOUNTER — Encounter: Payer: Self-pay | Admitting: Physical Medicine & Rehabilitation

## 2018-09-23 ENCOUNTER — Encounter: Payer: Self-pay | Admitting: Physical Medicine & Rehabilitation

## 2018-09-23 ENCOUNTER — Ambulatory Visit (HOSPITAL_BASED_OUTPATIENT_CLINIC_OR_DEPARTMENT_OTHER): Payer: No Typology Code available for payment source | Admitting: Physical Medicine & Rehabilitation

## 2018-09-23 ENCOUNTER — Encounter: Payer: No Typology Code available for payment source | Attending: Physical Medicine & Rehabilitation

## 2018-09-23 ENCOUNTER — Other Ambulatory Visit: Payer: Self-pay

## 2018-09-23 VITALS — BP 137/95 | HR 83 | Temp 98.0°F | Ht 68.5 in | Wt 393.0 lb

## 2018-09-23 DIAGNOSIS — M5416 Radiculopathy, lumbar region: Secondary | ICD-10-CM | POA: Diagnosis present

## 2018-09-23 DIAGNOSIS — M4726 Other spondylosis with radiculopathy, lumbar region: Secondary | ICD-10-CM

## 2018-09-23 DIAGNOSIS — M4317 Spondylolisthesis, lumbosacral region: Secondary | ICD-10-CM

## 2018-09-23 DIAGNOSIS — M961 Postlaminectomy syndrome, not elsewhere classified: Secondary | ICD-10-CM | POA: Diagnosis not present

## 2018-09-23 MED ORDER — MELOXICAM 7.5 MG PO TABS: 7.5000 mg | ORAL_TABLET | Freq: Every day | ORAL | 1 refills | Status: DC

## 2018-09-23 NOTE — Patient Instructions (Signed)
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
## 2018-10-05 ENCOUNTER — Ambulatory Visit (INDEPENDENT_AMBULATORY_CARE_PROVIDER_SITE_OTHER): Payer: 59 | Admitting: Podiatry

## 2018-10-05 ENCOUNTER — Ambulatory Visit: Payer: 59 | Admitting: Podiatry

## 2018-10-05 ENCOUNTER — Other Ambulatory Visit: Payer: Self-pay

## 2018-10-05 ENCOUNTER — Ambulatory Visit (INDEPENDENT_AMBULATORY_CARE_PROVIDER_SITE_OTHER): Payer: 59

## 2018-10-05 ENCOUNTER — Encounter: Payer: Self-pay | Admitting: Podiatry

## 2018-10-05 VITALS — Temp 97.9°F

## 2018-10-05 DIAGNOSIS — S99912A Unspecified injury of left ankle, initial encounter: Secondary | ICD-10-CM | POA: Diagnosis not present

## 2018-10-05 DIAGNOSIS — S82892A Other fracture of left lower leg, initial encounter for closed fracture: Secondary | ICD-10-CM

## 2018-10-05 MED ORDER — HYDROCODONE-ACETAMINOPHEN 5-325 MG PO TABS: 1.0000 | ORAL_TABLET | Freq: Four times a day (QID) | ORAL | 0 refills | Status: DC | PRN

## 2018-10-05 MED ORDER — HYDROCODONE-ACETAMINOPHEN 5-325 MG PO TABS: 1.0000 | ORAL_TABLET | Freq: Four times a day (QID) | ORAL | 0 refills | Status: AC | PRN

## 2018-10-06 ENCOUNTER — Encounter: Payer: Self-pay | Admitting: Podiatry

## 2018-10-06 ENCOUNTER — Telehealth: Payer: Self-pay | Admitting: *Deleted

## 2018-10-06 DIAGNOSIS — S82899A Other fracture of unspecified lower leg, initial encounter for closed fracture: Secondary | ICD-10-CM

## 2018-10-06 NOTE — Addendum Note (Signed)
Addended by: Hadley Pen R on: 10/06/2018 03:53 PM   Modules accepted: Orders
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
## 2018-10-06 NOTE — Telephone Encounter (Signed)
-----   Message from Matthew R Wagoner, DPM sent at 10/06/2018 12:52 PM EDT ----- Can you please order a CT scan of his left ankle to assess ankle fracture? Thanks.   I will do his dictation soon  

## 2018-10-06 NOTE — Telephone Encounter (Signed)
-----   Message from [name], DPM sent at 10/06/2018 12:52 PM EDT ----- Can you please order a CT scan of his left ankle to assess ankle fracture? Thanks.   I will do his dictation soon

## 2018-10-06 NOTE — Telephone Encounter (Signed)
Orders to J. Quintana, RN for pre-cert and faxed to [facility]. 

## 2018-10-09 ENCOUNTER — Emergency Department (HOSPITAL_COMMUNITY): Payer: 59

## 2018-10-09 ENCOUNTER — Encounter (HOSPITAL_COMMUNITY): Payer: Self-pay

## 2018-10-09 ENCOUNTER — Other Ambulatory Visit: Payer: Self-pay

## 2018-10-09 ENCOUNTER — Emergency Department (HOSPITAL_COMMUNITY)
Admission: EM | Admit: 2018-10-09 | Discharge: 2018-10-09 | Disposition: A | Discharge status: Home / Self Care | Payer: 59 | Attending: Emergency Medicine | Admitting: Emergency Medicine

## 2018-10-09 DIAGNOSIS — Y929 Unspecified place or not applicable: Secondary | ICD-10-CM | POA: Diagnosis not present

## 2018-10-09 DIAGNOSIS — Y939 Activity, unspecified: Secondary | ICD-10-CM | POA: Diagnosis not present

## 2018-10-09 DIAGNOSIS — S8002XA Contusion of left knee, initial encounter: Secondary | ICD-10-CM | POA: Diagnosis not present

## 2018-10-09 DIAGNOSIS — Y999 Unspecified external cause status: Secondary | ICD-10-CM | POA: Insufficient documentation

## 2018-10-09 DIAGNOSIS — W19XXXA Unspecified fall, initial encounter: Secondary | ICD-10-CM | POA: Insufficient documentation

## 2018-10-09 DIAGNOSIS — Z79899 Other long term (current) drug therapy: Secondary | ICD-10-CM | POA: Insufficient documentation

## 2018-10-09 DIAGNOSIS — M25562 Pain in left knee: Secondary | ICD-10-CM

## 2018-10-09 DIAGNOSIS — S8992XA Unspecified injury of left lower leg, initial encounter: Secondary | ICD-10-CM | POA: Diagnosis present

## 2018-10-09 DIAGNOSIS — F17220 Nicotine dependence, chewing tobacco, uncomplicated: Secondary | ICD-10-CM | POA: Diagnosis not present

## 2018-10-09 DIAGNOSIS — I10 Essential (primary) hypertension: Secondary | ICD-10-CM | POA: Diagnosis not present

## 2018-10-09 MED ORDER — OXYCODONE-ACETAMINOPHEN 5-325 MG PO TABS
2.0000 | ORAL_TABLET | Freq: Once | ORAL | Status: AC
  Administered 2018-10-09: 2 via ORAL
  Filled 2018-10-09: qty 2

## 2018-10-09 MED ORDER — IBUPROFEN 600 MG PO TABS: 600.0000 mg | ORAL_TABLET | Freq: Four times a day (QID) | ORAL | 0 refills | Status: DC | PRN

## 2018-10-09 NOTE — ED Notes (Signed)
Boot placed on patient prior to transporting out in [facility]. Pt assisted with putting on paper scrub pants.

## 2018-10-09 NOTE — Discharge Instructions (Addendum)
You were seen today for knee pain.  Your x-rays are negative.  This may be related to using a scooter.  Add ibuprofen to your pain regimen.

## 2018-10-09 NOTE — ED Triage Notes (Signed)
Pt had fallen on Sunday, states he has a fracture in his left ankle from same, has a boot he has been wearing after seeing ortho on Monday.  Tonight he was in the shower when he had onset of pain behind the left knee.  Pt did not fall or have injury tonght.
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
## 2018-10-11 ENCOUNTER — Other Ambulatory Visit: Payer: Self-pay | Admitting: Podiatry

## 2018-10-11 ENCOUNTER — Telehealth: Payer: Self-pay | Admitting: Podiatry

## 2018-10-11 MED ORDER — HYDROCODONE-ACETAMINOPHEN 5-325 MG PO TABS: 1.0000 | ORAL_TABLET | Freq: Four times a day (QID) | ORAL | 0 refills | Status: DC | PRN

## 2018-10-11 NOTE — Telephone Encounter (Signed)
Done  Can you see the status of the CT scan?

## 2018-10-11 NOTE — Telephone Encounter (Signed)
I called pt and he states he is still having some pain and request refill of the pain medication. I instructed pt to continue the use of the knee scooter and the boot, and reminded him that having the foot below his heart even on the knee scooter could cause pain and swelling, to continue the rest, ice and elevate and if tolerates take the ibuprofen OTC as directed in between the pain medication, and I would inform Dr. Ardelle Anton.

## 2018-10-11 NOTE — Telephone Encounter (Signed)
Pt called requesting a refill on his pain medication, hydrocodone.

## 2018-10-13 ENCOUNTER — Telehealth: Payer: Self-pay | Admitting: Podiatry

## 2018-10-13 NOTE — Telephone Encounter (Signed)
Left message informing pt the CT had be prior authorized and he could contact [facility] [phone] to schedule. [place] AUTHORIZATION:  [id number] EXPIRING 11/26/2018. Faxed orders with PA to [facility].

## 2018-10-13 NOTE — Telephone Encounter (Signed)
Called and asked about his CT scan. Please call when possible

## 2018-10-18 ENCOUNTER — Ambulatory Visit
Admission: RE | Admit: 2018-10-18 | Discharge: 2018-10-18 | Disposition: A | Discharge status: Home / Self Care | Payer: 59 | Source: Ambulatory Visit | Attending: Podiatry | Admitting: Podiatry

## 2018-10-18 ENCOUNTER — Other Ambulatory Visit: Payer: Self-pay

## 2018-10-18 DIAGNOSIS — S82899A Other fracture of unspecified lower leg, initial encounter for closed fracture: Secondary | ICD-10-CM

## 2018-10-19 ENCOUNTER — Other Ambulatory Visit: Payer: Self-pay | Admitting: Podiatry

## 2018-10-19 ENCOUNTER — Encounter: Payer: Self-pay | Admitting: Podiatry

## 2018-10-19 ENCOUNTER — Ambulatory Visit: Payer: 59 | Admitting: Podiatry

## 2018-10-19 VITALS — Temp 96.6°F

## 2018-10-19 DIAGNOSIS — S99912A Unspecified injury of left ankle, initial encounter: Secondary | ICD-10-CM

## 2018-10-19 DIAGNOSIS — T148XXA Other injury of unspecified body region, initial encounter: Secondary | ICD-10-CM

## 2018-10-19 MED ORDER — HYDROCODONE-ACETAMINOPHEN 5-325 MG PO TABS: 1.0000 | ORAL_TABLET | Freq: Four times a day (QID) | ORAL | 0 refills | Status: DC | PRN

## 2018-10-19 NOTE — Progress Notes (Signed)
Subjective: 43 year old male presents the office for evaluation of left ankle pain.  States that overall he is feeling better.  Continues to have some discomfort.  The cam boot does help.  Strength of his foot is much as possible.  He presents to discuss CT scan results as well. Denies any systemic complaints such as fevers, chills, nausea, vomiting. No acute changes since last appointment, and no other complaints at this time.   Objective: AAO x3, NAD DP/PT pulses palpable bilaterally, CRT less than 3 seconds There is still localized edema to the lateral aspect ankle swelling is majority of tenderness.  There is tenderness along the lateral ankle ligaments mostly along the ATFL and the CFL.  Mild tenderness palpation the course the peroneal tendon.  Achilles tendon is normal discomfort however Thompson test is negative and the Achilles tendon appears to be intact. MMT 4/5 due to pain.  No other areas of tenderness elicited. Mild swelling to the foot.  No tenderness palpation to the metatarsals, MPJs, Lisfranc joint.  Minimal erythema to the process of the foot but the swelling. No pain with calf compression, swelling, warmth, erythema  CT Scan: Negative for fracture. Accessory ossicle or remote avulsion off fracture off of the tip of the distal fibula is unchanged since 2017.  Subcutaneous edema about the lateral aspect of the ankle. The anterior talofibular ligament is not visualized and likely torn.  Assessment: Left lateral ankle ligament tear  Plan: -All treatment options discussed with the patient including all alternatives, risks, complications.  -CT scan results were discussed with him today. -Patient is a continue to do so and to continue to mobilize in the cam boot remain nonweightbearing for now.  As he starts to feel better then we can start transition to partial weightbearing.  I will see him back in 2 weeks and at that time if he is doing better start physical therapy. -The  visit was applied today.  Precautions were advised on when to remove this. -Refill pain medication as well. -We will hold off on acute surgical intervention.  I do not believe acute intervention is needed.  I discussed possible surgical intervention if needed long term -Patient encouraged to call the office with any questions, concerns, change in symptoms.   RTC 2 weeks or sooner if needed  Sean Murillo DPM

## 2018-10-21 ENCOUNTER — Other Ambulatory Visit: Payer: Self-pay

## 2018-10-21 ENCOUNTER — Encounter
Admission: EM | Disposition: A | Discharge status: Home / Self Care | Payer: Self-pay | Source: Home / Self Care | Attending: Surgery

## 2018-10-21 ENCOUNTER — Encounter: Payer: Self-pay | Admitting: Intensive Care

## 2018-10-21 ENCOUNTER — Ambulatory Visit: Payer: No Typology Code available for payment source | Admitting: Physical Medicine & Rehabilitation

## 2018-10-21 ENCOUNTER — Emergency Department: Payer: 59 | Admitting: Certified Registered"

## 2018-10-21 ENCOUNTER — Inpatient Hospital Stay
Admission: EM | Admit: 2018-10-21 | Discharge: 2018-10-22 | DRG: 336 | Disposition: A | Discharge status: Home / Self Care | Payer: 59 | Attending: Surgery | Admitting: Surgery

## 2018-10-21 DIAGNOSIS — Z91041 Radiographic dye allergy status: Secondary | ICD-10-CM

## 2018-10-21 DIAGNOSIS — K450 Other specified abdominal hernia with obstruction, without gangrene: Secondary | ICD-10-CM

## 2018-10-21 DIAGNOSIS — Z6841 Body Mass Index (BMI) 40.0 and over, adult: Secondary | ICD-10-CM | POA: Diagnosis not present

## 2018-10-21 DIAGNOSIS — Z9081 Acquired absence of spleen: Secondary | ICD-10-CM | POA: Diagnosis not present

## 2018-10-21 DIAGNOSIS — F4310 Post-traumatic stress disorder, unspecified: Secondary | ICD-10-CM | POA: Diagnosis present

## 2018-10-21 DIAGNOSIS — Z8701 Personal history of pneumonia (recurrent): Secondary | ICD-10-CM | POA: Diagnosis not present

## 2018-10-21 DIAGNOSIS — Z87442 Personal history of urinary calculi: Secondary | ICD-10-CM

## 2018-10-21 DIAGNOSIS — K660 Peritoneal adhesions (postprocedural) (postinfection): Secondary | ICD-10-CM | POA: Diagnosis present

## 2018-10-21 DIAGNOSIS — Z91013 Allergy to seafood: Secondary | ICD-10-CM | POA: Diagnosis not present

## 2018-10-21 DIAGNOSIS — I10 Essential (primary) hypertension: Secondary | ICD-10-CM | POA: Diagnosis present

## 2018-10-21 DIAGNOSIS — K430 Incisional hernia with obstruction, without gangrene: Secondary | ICD-10-CM | POA: Diagnosis present

## 2018-10-21 DIAGNOSIS — Z720 Tobacco use: Secondary | ICD-10-CM | POA: Diagnosis not present

## 2018-10-21 DIAGNOSIS — Z79899 Other long term (current) drug therapy: Secondary | ICD-10-CM | POA: Diagnosis not present

## 2018-10-21 HISTORY — PX: VENTRAL HERNIA REPAIR: SHX424

## 2018-10-21 LAB — URINALYSIS, COMPLETE (UACMP) WITH MICROSCOPIC
Bacteria, UA: NONE SEEN
Bilirubin Urine: NEGATIVE
Glucose, UA: NEGATIVE mg/dL
Hgb urine dipstick: NEGATIVE
Ketones, ur: NEGATIVE mg/dL
Leukocytes,Ua: NEGATIVE
Nitrite: NEGATIVE
Protein, ur: 30 mg/dL — AB
Specific Gravity, Urine: 1.028 (ref 1.005–1.030)
pH: 5 (ref 5.0–8.0)

## 2018-10-21 LAB — CBC
HCT: 43.8 % (ref 39.0–52.0)
Hemoglobin: 15.1 g/dL (ref 13.0–17.0)
MCH: 28.3 pg (ref 26.0–34.0)
MCHC: 34.5 g/dL (ref 30.0–36.0)
MCV: 82.2 fL (ref 80.0–100.0)
Platelets: 621 10*3/uL — ABNORMAL HIGH (ref 150–400)
RBC: 5.33 MIL/uL (ref 4.22–5.81)
RDW: 13.9 % (ref 11.5–15.5)
WBC: 23.1 10*3/uL — ABNORMAL HIGH (ref 4.0–10.5)
nRBC: 0 % (ref 0.0–0.2)

## 2018-10-21 LAB — COMPREHENSIVE METABOLIC PANEL
ALT: 26 U/L (ref 0–44)
AST: 18 U/L (ref 15–41)
Albumin: 3.7 g/dL (ref 3.5–5.0)
Alkaline Phosphatase: 61 U/L (ref 38–126)
Anion gap: 12 (ref 5–15)
BUN: 15 mg/dL (ref 6–20)
CO2: 27 mmol/L (ref 22–32)
Calcium: 9.3 mg/dL (ref 8.9–10.3)
Chloride: 99 mmol/L (ref 98–111)
Creatinine, Ser: 0.69 mg/dL (ref 0.61–1.24)
GFR calc Af Amer: >60 mL/min (ref >60)
GFR calc non Af Amer: >60 mL/min (ref >60)
Glucose, Bld: 111 mg/dL — ABNORMAL HIGH (ref 70–99)
Potassium: 3.8 mmol/L (ref 3.5–5.1)
Sodium: 138 mmol/L (ref 135–145)
Total Bilirubin: 0.4 mg/dL (ref 0.3–1.2)
Total Protein: 8.3 g/dL — ABNORMAL HIGH (ref 6.5–8.1)

## 2018-10-21 LAB — LIPASE, BLOOD: Lipase: 28 U/L (ref 11–51)

## 2018-10-21 SURGERY — REPAIR, HERNIA, VENTRAL
Anesthesia: General | Site: Abdomen

## 2018-10-21 MED ORDER — EPHEDRINE SULFATE 50 MG/ML IJ SOLN
INTRAMUSCULAR | Status: DC | PRN
  Administered 2018-10-21 (×2): 10 mg via INTRAVENOUS

## 2018-10-21 MED ORDER — GLYCOPYRROLATE 0.2 MG/ML IJ SOLN
INTRAMUSCULAR | Status: AC
  Filled 2018-10-21: qty 1

## 2018-10-21 MED ORDER — ROCURONIUM BROMIDE 50 MG/5ML IV SOLN
INTRAVENOUS | Status: AC
  Filled 2018-10-21: qty 1

## 2018-10-21 MED ORDER — HYDROMORPHONE HCL 1 MG/ML IJ SOLN: 0.2500 mg | INTRAMUSCULAR | Status: DC | PRN

## 2018-10-21 MED ORDER — BUPIVACAINE LIPOSOME 1.3 % IJ SUSP
INTRAMUSCULAR | Status: DC | PRN
  Administered 2018-10-21: 23:00:00 80 mL

## 2018-10-21 MED ORDER — SUGAMMADEX SODIUM 200 MG/2ML IV SOLN
INTRAVENOUS | Status: DC | PRN
  Administered 2018-10-21: 350 mg via INTRAVENOUS

## 2018-10-21 MED ORDER — SUGAMMADEX SODIUM 500 MG/5ML IV SOLN
INTRAVENOUS | Status: AC
  Filled 2018-10-21: qty 5

## 2018-10-21 MED ORDER — DEXAMETHASONE SODIUM PHOSPHATE 10 MG/ML IJ SOLN
INTRAMUSCULAR | Status: DC | PRN
  Administered 2018-10-21: 10 mg via INTRAVENOUS

## 2018-10-21 MED ORDER — SUCCINYLCHOLINE CHLORIDE 20 MG/ML IJ SOLN
INTRAMUSCULAR | Status: DC | PRN
  Administered 2018-10-21: 120 mg via INTRAVENOUS

## 2018-10-21 MED ORDER — BARIUM SULFATE 2.1 % PO SUSP
450.0000 mL | ORAL | Status: AC
  Administered 2018-10-21 (×2): 450 mL via ORAL

## 2018-10-21 MED ORDER — ONDANSETRON HCL 4 MG/2ML IJ SOLN
INTRAMUSCULAR | Status: AC
  Filled 2018-10-21: qty 2

## 2018-10-21 MED ORDER — LIDOCAINE HCL (CARDIAC) PF 100 MG/5ML IV SOSY
PREFILLED_SYRINGE | INTRAVENOUS | Status: DC | PRN
  Administered 2018-10-21: 50 mg via INTRAVENOUS

## 2018-10-21 MED ORDER — DEXTROSE 5 % IV SOLN
INTRAVENOUS | Status: DC | PRN
  Administered 2018-10-21: 21:00:00 3 g via INTRAVENOUS

## 2018-10-21 MED ORDER — BUPIVACAINE HCL (PF) 0.5 % IJ SOLN
INTRAMUSCULAR | Status: AC
  Filled 2018-10-21: qty 30

## 2018-10-21 MED ORDER — LACTATED RINGERS IV SOLN
INTRAVENOUS | Status: DC | PRN
  Administered 2018-10-21: 21:00:00 via INTRAVENOUS

## 2018-10-21 MED ORDER — FENTANYL CITRATE (PF) 100 MCG/2ML IJ SOLN
INTRAMUSCULAR | Status: DC | PRN
  Administered 2018-10-21: 100 ug via INTRAVENOUS
  Administered 2018-10-21 (×4): 50 ug via INTRAVENOUS

## 2018-10-21 MED ORDER — PHENYLEPHRINE HCL (PRESSORS) 10 MG/ML IV SOLN
INTRAVENOUS | Status: DC | PRN
  Administered 2018-10-21 (×3): 200 ug via INTRAVENOUS
  Administered 2018-10-21: 100 ug via INTRAVENOUS
  Administered 2018-10-21 (×2): 200 ug via INTRAVENOUS

## 2018-10-21 MED ORDER — DEXMEDETOMIDINE HCL IN NACL 200 MCG/50ML IV SOLN
INTRAVENOUS | Status: DC | PRN
  Administered 2018-10-21: 20 ug via INTRAVENOUS

## 2018-10-21 MED ORDER — FENTANYL CITRATE (PF) 100 MCG/2ML IJ SOLN
INTRAMUSCULAR | Status: AC
  Filled 2018-10-21: qty 2

## 2018-10-21 MED ORDER — ROCURONIUM BROMIDE 100 MG/10ML IV SOLN
INTRAVENOUS | Status: DC | PRN
  Administered 2018-10-21: 20 mg via INTRAVENOUS
  Administered 2018-10-21: 10 mg via INTRAVENOUS
  Administered 2018-10-21: 20 mg via INTRAVENOUS
  Administered 2018-10-21: 10 mg via INTRAVENOUS
  Administered 2018-10-21: 40 mg via INTRAVENOUS

## 2018-10-21 MED ORDER — SODIUM CHLORIDE (PF) 0.9 % IJ SOLN
INTRAMUSCULAR | Status: AC
  Filled 2018-10-21: qty 50

## 2018-10-21 MED ORDER — DEXAMETHASONE SODIUM PHOSPHATE 10 MG/ML IJ SOLN
INTRAMUSCULAR | Status: AC
  Filled 2018-10-21: qty 1

## 2018-10-21 MED ORDER — SODIUM CHLORIDE 0.9 % IV SOLN
INTRAVENOUS | Status: DC | PRN
  Administered 2018-10-21: 50 ug/min via INTRAVENOUS

## 2018-10-21 MED ORDER — KETOROLAC TROMETHAMINE 30 MG/ML IJ SOLN: 30.0000 mg | Freq: Once | INTRAMUSCULAR | Status: DC | PRN

## 2018-10-21 MED ORDER — LIDOCAINE HCL (PF) 2 % IJ SOLN
INTRAMUSCULAR | Status: AC
  Filled 2018-10-21: qty 10

## 2018-10-21 MED ORDER — PROPOFOL 10 MG/ML IV BOLUS
INTRAVENOUS | Status: AC
  Filled 2018-10-21: qty 20

## 2018-10-21 MED ORDER — ACETAMINOPHEN 10 MG/ML IV SOLN: 1000.0000 mg | Freq: Once | INTRAVENOUS | Status: DC | PRN

## 2018-10-21 MED ORDER — KETAMINE HCL 50 MG/ML IJ SOLN
INTRAMUSCULAR | Status: DC | PRN
  Administered 2018-10-21: 50 mg via INTRAVENOUS

## 2018-10-21 MED ORDER — CEFAZOLIN SODIUM 1 G IJ SOLR
INTRAMUSCULAR | Status: AC
  Filled 2018-10-21: qty 30

## 2018-10-21 MED ORDER — BUPIVACAINE LIPOSOME 1.3 % IJ SUSP
INTRAMUSCULAR | Status: AC
  Filled 2018-10-21: qty 20

## 2018-10-21 MED ORDER — MORPHINE SULFATE (PF) 4 MG/ML IV SOLN
4.0000 mg | Freq: Once | INTRAVENOUS | Status: AC
  Administered 2018-10-21: 19:00:00 4 mg via INTRAVENOUS
  Filled 2018-10-21: qty 1

## 2018-10-21 MED ORDER — KETAMINE HCL 50 MG/ML IJ SOLN
INTRAMUSCULAR | Status: AC
  Filled 2018-10-21: qty 10

## 2018-10-21 MED ORDER — GLYCOPYRROLATE 0.2 MG/ML IJ SOLN
INTRAMUSCULAR | Status: DC | PRN
  Administered 2018-10-21: 0.2 mg via INTRAVENOUS

## 2018-10-21 MED ORDER — ONDANSETRON HCL 4 MG/2ML IJ SOLN
INTRAMUSCULAR | Status: DC | PRN
  Administered 2018-10-21: 4 mg via INTRAVENOUS

## 2018-10-21 MED ORDER — PROPOFOL 10 MG/ML IV BOLUS
INTRAVENOUS | Status: DC | PRN
  Administered 2018-10-21: 200 mg via INTRAVENOUS

## 2018-10-21 MED ORDER — ONDANSETRON HCL 4 MG/2ML IJ SOLN
4.0000 mg | Freq: Once | INTRAMUSCULAR | Status: AC
  Administered 2018-10-21: 4 mg via INTRAVENOUS
  Filled 2018-10-21: qty 2

## 2018-10-21 SURGICAL SUPPLY — 52 items
BINDER ABDOMINAL 12 ML 46-62 (SOFTGOODS) IMPLANT
BLADE SURG 15 STRL LF DISP TIS (BLADE) ×1 IMPLANT
BLADE SURG 15 STRL SS (BLADE) ×1
CANISTER SUCT 1200ML W/VALVE (MISCELLANEOUS) ×2 IMPLANT
CHLORAPREP W/TINT 26 (MISCELLANEOUS) ×4 IMPLANT
COVER LIGHT HANDLE STERIS (MISCELLANEOUS) ×2 IMPLANT
COVER WAND RF STERILE (DRAPES) IMPLANT
DERMABOND ADVANCED (GAUZE/BANDAGES/DRESSINGS)
DERMABOND ADVANCED .7 DNX12 (GAUZE/BANDAGES/DRESSINGS) IMPLANT
DRAIN CHANNEL JP 15F RND 16 (MISCELLANEOUS) ×2 IMPLANT
DRAPE LAPAROTOMY 100X77 ABD (DRAPES) ×2 IMPLANT
DRSG OPSITE POSTOP 4X10 (GAUZE/BANDAGES/DRESSINGS) ×2 IMPLANT
ELECT BLADE 6.5 EXT (BLADE) ×2 IMPLANT
ELECT REM PT RETURN 9FT ADLT (ELECTROSURGICAL) ×2
ELECTRODE REM PT RTRN 9FT ADLT (ELECTROSURGICAL) ×1 IMPLANT
GLOVE BIO SURGEON STRL SZ7 (GLOVE) ×2 IMPLANT
GLOVE BIOGEL PI IND STRL 7.0 (GLOVE) ×1 IMPLANT
GLOVE BIOGEL PI IND STRL 7.5 (GLOVE) ×1 IMPLANT
GLOVE BIOGEL PI IND STRL 8 (GLOVE) ×1 IMPLANT
GLOVE BIOGEL PI INDICATOR 7.0 (GLOVE) ×1
GLOVE BIOGEL PI INDICATOR 7.5 (GLOVE) ×1
GLOVE BIOGEL PI INDICATOR 8 (GLOVE) ×1
GLOVE SURG SYN 6.5 ES PF (GLOVE) ×2 IMPLANT
GOWN STRL REUS W/ TWL LRG LVL3 (GOWN DISPOSABLE) ×2 IMPLANT
GOWN STRL REUS W/TWL LRG LVL3 (GOWN DISPOSABLE) ×2
KIT TURNOVER KIT A (KITS) ×2 IMPLANT
LABEL OR SOLS (LABEL) ×2 IMPLANT
NEEDLE HYPO 22GX1.5 SAFETY (NEEDLE) ×4 IMPLANT
NS IRRIG 1000ML POUR BTL (IV SOLUTION) ×4 IMPLANT
NS IRRIG 500ML POUR BTL (IV SOLUTION) IMPLANT
PACK BASIN MINOR ARMC (MISCELLANEOUS) ×2 IMPLANT
SLEEVE SCD COMPRESS THIGH MED (MISCELLANEOUS) ×2 IMPLANT
SPONGE DRAIN TRACH 4X4 STRL 2S (GAUZE/BANDAGES/DRESSINGS) ×2 IMPLANT
STAPLER SKIN PROX 35W (STAPLE) ×2 IMPLANT
SUCT RESERVOIR 100CC (MISCELLANEOUS) ×2 IMPLANT
SUT ETHIBOND NAB MO 7 #0 18IN (SUTURE) IMPLANT
SUT ETHILON 3-0 FS-10 30 BLK (SUTURE) ×2
SUT MNCRL 4-0 (SUTURE)
SUT MNCRL 4-0 27XMFL (SUTURE)
SUT SILK 2 0 PERMA HAND 18 BK (SUTURE) IMPLANT
SUT SILK 3-0 (SUTURE) ×1
SUT SILK 3-0 RB1 30XBRD (SUTURE) ×1
SUT VIC AB 0 CT1 18XCR BRD 8 (SUTURE) ×1 IMPLANT
SUT VIC AB 0 CT1 8-18 (SUTURE) ×1
SUT VIC AB 3-0 SH 27 (SUTURE) ×3
SUT VIC AB 3-0 SH 27X BRD (SUTURE) ×3 IMPLANT
SUTURE EHLN 3-0 FS-10 30 BLK (SUTURE) ×1 IMPLANT
SUTURE MNCRL 4-0 27XMF (SUTURE) IMPLANT
SUTURE SILK 3-0 RB1 30XBRD (SUTURE) ×1 IMPLANT
SYR 10ML LL (SYRINGE) ×4 IMPLANT
SYR 30ML LL (SYRINGE) ×4 IMPLANT
WATER STERILE IRR 1000ML POUR (IV SOLUTION) IMPLANT
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
## 2018-10-21 NOTE — ED Notes (Signed)
Patient transported to CT 

## 2018-10-21 NOTE — Transfer of Care (Signed)
Immediate Anesthesia Transfer of Care Note  Patient: Standford Snowball  Procedure(s) Performed: HERNIA REPAIR VENTRAL ADULT (N/A Abdomen)  Patient Location: PACU  Anesthesia Type:General  Level of Consciousness: awake, alert  and oriented  Airway & Oxygen Therapy: Patient Spontanous Breathing and Patient connected to face mask oxygen  Post-op Assessment: Report given to RN and Post -op Vital signs reviewed and stable  Post vital signs: Reviewed  Last Vitals:  Vitals Value Taken Time  BP 109/69 10/21/2018 11:31 PM  Temp 37 C 10/21/2018 11:31 PM  Pulse 110 10/21/2018 11:31 PM  Resp 15 10/21/2018 11:31 PM  SpO2 97 % 10/21/2018 11:31 PM    Last Pain:  Vitals:   10/21/18 1910  TempSrc:   PainSc: 10-Worst pain ever         Complications: No apparent anesthesia complications
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
## 2018-10-21 NOTE — ED Triage Notes (Signed)
Patient c/o abd pain due to hernia. Reports some nausea. Denies V/D. Denies problems with hernia in past.

## 2018-10-21 NOTE — Anesthesia Procedure Notes (Signed)
Procedure Name: Intubation Performed by: Mathews Argyle, CRNA Pre-anesthesia Checklist: Patient identified, Patient being monitored, Timeout performed, Emergency Drugs available and Suction available Patient Re-evaluated:Patient Re-evaluated prior to induction Oxygen Delivery Method: Circle system utilized Preoxygenation: Pre-oxygenation with 100% oxygen Induction Type: IV induction and Rapid sequence Laryngoscope Size: McGraph and 4 Grade View: Grade I Tube type: Oral Tube size: 7.0 mm Number of attempts: 1 Airway Equipment and Method: Stylet,  Video-laryngoscopy and Patient positioned with wedge pillow Placement Confirmation: ETT inserted through vocal cords under direct vision,  positive ETCO2 and breath sounds checked- equal and bilateral Secured at: 22 cm Tube secured with: Tape Dental Injury: Teeth and Oropharynx as per pre-operative assessment  Difficulty Due To: Difficulty was anticipated, Difficult Airway- due to limited oral opening, Difficult Airway- due to anterior larynx, Difficult Airway- due to reduced neck mobility and Difficult Airway- due to large tongue Future Recommendations: Recommend- induction with short-acting agent, and alternative techniques readily available

## 2018-10-21 NOTE — Op Note (Signed)
Preoperative diagnosis: Incarcerated recurrent incisional hernia Postoperative diagnosis: Same  Procedure:  Open incarcerated recurrent incisional hernia repair  Anesthesia: General  Surgeon: Dr. Tonna BoehringerSakai  Wound Classification: Clean  Specimen: Hernia sac  Complications: None  Estimated Blood Loss: 10mL   Indications:  Patient is a 43 y.o. male developed a symptomatic incarcerated recurrent incisional hernia. Repair was indicated to avoid complications of strangulation Findings: 1.  Large, multiple hernia defects along the right lateral aspect of incision and previous mesh 2.  Extremely attenuated fascial layers  3. Adequate hemostasis achieved  Description of procedure: The patient was taken to the operating room. A time-out was completed verifying correct patient, procedure, site, positioning, and implant(s) and/or special equipment prior to beginning this procedure. The area was prepped and draped in the usual sterile fashion.   Incision was made overlying the palpable hernia slightly right to the midline incision.  Dissection carried down to the hernia sac itself within the subcutaneous layer.  The sac was then dissected off the surrounding subcutaneous tissue.  After itwas isolated, the sac was entered and from the within the peritoneal cavity, the defect within the very attenuated fascia was able to be palpated but with some difficulty due to the thin nature of the fascia layers as well as the thick peritoneal lining surrounding the previously implanted mesh in the area.  Several hernia defects were noted to be within this general area, and the division between them needed to be cut in order to release the incarcerated small bowel within the hernia sac.  Inspection of the bowel noted healthy viable bowel with no evidence of ischemia.  Once all the bowel was removed and thoroughly inspected, it was reduced within the abdominal cavity.  Attempt was made to clear the underlying peritoneal  lining, but this proved impossible due to the extensive adhesions between omentum, small bowel, and the previously placed mesh within the peritoneum.  Attempt was then made to separate the preperitoneal layer from the overlying fascia, but the plane was not clearly identifiable between the preperitoneal layer and the posterior fascia.  Attempt was then made to separate the anterior and posterior fascia but this also proved to be impossible due to the extremely thin fascia.    At this point decision was made to grab all the layers together to form a workable hernia edge and to close primarily.  The defect itself in the end measured about 13 cm but was able to be approximated with minimal tension secondary to the attenuated layers.  0 Vicryl sutures in a figure of 8 fashion interrupted was then used to close the defect primarily.  Enough of the inferior portion of the edge was able to be cleared with extensive lysis of adhesions to safely pass the sutures without injuring the intra-abdominal organs.  Once the defect was repaired, the excess hernia sac overlying the repair itself was removed and sent off to pathology.  Exparel was then infused into the fascial layer as well as the skin incision prior to placing a Blake drain atop the repair.  The skin incision was then closed using interrupted 3-0 Vicryl and staples.  Drain was secured to the skin using 3-0 nylon.  Drain area secured with drain sponge, and the incision was covered with honeycomb dressing.   The patient tolerated the procedure well and was taken to the postanesthesia care unit in stable condition. Sponge and instrument count correct at end of procedure and Foley placed prior to the procedure was removed before  transfer.

## 2018-10-21 NOTE — Anesthesia Post-op Follow-up Note (Signed)
Anesthesia QCDR form completed.        

## 2018-10-22 ENCOUNTER — Other Ambulatory Visit: Payer: Self-pay

## 2018-10-22 ENCOUNTER — Encounter: Payer: Self-pay | Admitting: *Deleted

## 2018-10-22 LAB — BASIC METABOLIC PANEL
Anion gap: 12 (ref 5–15)
BUN: 17 mg/dL (ref 6–20)
CO2: 26 mmol/L (ref 22–32)
Calcium: 8.7 mg/dL — ABNORMAL LOW (ref 8.9–10.3)
Chloride: 100 mmol/L (ref 98–111)
Creatinine, Ser: 0.93 mg/dL (ref 0.61–1.24)
GFR calc Af Amer: >60 mL/min (ref >60)
GFR calc non Af Amer: >60 mL/min (ref >60)
Glucose, Bld: 141 mg/dL — ABNORMAL HIGH (ref 70–99)
Potassium: 4.2 mmol/L (ref 3.5–5.1)
Sodium: 138 mmol/L (ref 135–145)

## 2018-10-22 LAB — CBC
HCT: 41.5 % (ref 39.0–52.0)
Hemoglobin: 14.2 g/dL (ref 13.0–17.0)
MCH: 28.2 pg (ref 26.0–34.0)
MCHC: 34.2 g/dL (ref 30.0–36.0)
MCV: 82.5 fL (ref 80.0–100.0)
Platelets: 602 10*3/uL — ABNORMAL HIGH (ref 150–400)
RBC: 5.03 MIL/uL (ref 4.22–5.81)
RDW: 14.2 % (ref 11.5–15.5)
WBC: 23.2 10*3/uL — ABNORMAL HIGH (ref 4.0–10.5)
nRBC: 0 % (ref 0.0–0.2)

## 2018-10-22 MED ORDER — SERTRALINE HCL 50 MG PO TABS
200.0000 mg | ORAL_TABLET | Freq: Every day | ORAL | Status: DC
  Administered 2018-10-22: 200 mg via ORAL
  Filled 2018-10-22: qty 4

## 2018-10-22 MED ORDER — HYDROCHLOROTHIAZIDE 12.5 MG PO CAPS
12.5000 mg | ORAL_CAPSULE | Freq: Every day | ORAL | Status: DC
  Administered 2018-10-22: 12.5 mg via ORAL
  Filled 2018-10-22: qty 1

## 2018-10-22 MED ORDER — ONDANSETRON 4 MG PO TBDP: 4.0000 mg | ORAL_TABLET | Freq: Four times a day (QID) | ORAL | Status: DC | PRN

## 2018-10-22 MED ORDER — HYDROCODONE-ACETAMINOPHEN 5-325 MG PO TABS
1.0000 | ORAL_TABLET | ORAL | Status: DC | PRN
  Administered 2018-10-22: 07:00:00 2 via ORAL
  Administered 2018-10-22 (×2): 1 via ORAL
  Filled 2018-10-22: qty 1
  Filled 2018-10-22 (×2): qty 2

## 2018-10-22 MED ORDER — LISINOPRIL-HYDROCHLOROTHIAZIDE 20-12.5 MG PO TABS: 1.0000 | ORAL_TABLET | Freq: Every day | ORAL | Status: DC

## 2018-10-22 MED ORDER — IBUPROFEN 400 MG PO TABS: 600.0000 mg | ORAL_TABLET | Freq: Four times a day (QID) | ORAL | Status: DC | PRN

## 2018-10-22 MED ORDER — DOCUSATE SODIUM 100 MG PO CAPS: 100.0000 mg | ORAL_CAPSULE | Freq: Two times a day (BID) | ORAL | 0 refills | Status: AC | PRN

## 2018-10-22 MED ORDER — GABAPENTIN 300 MG PO CAPS
300.0000 mg | ORAL_CAPSULE | Freq: Two times a day (BID) | ORAL | Status: DC
  Administered 2018-10-22 (×2): 300 mg via ORAL
  Filled 2018-10-22 (×2): qty 1

## 2018-10-22 MED ORDER — IBUPROFEN 800 MG PO TABS: 800.0000 mg | ORAL_TABLET | Freq: Three times a day (TID) | ORAL | 0 refills | Status: DC | PRN

## 2018-10-22 MED ORDER — ONDANSETRON HCL 4 MG/2ML IJ SOLN: 4.0000 mg | Freq: Four times a day (QID) | INTRAMUSCULAR | Status: DC | PRN

## 2018-10-22 MED ORDER — ACETAMINOPHEN 650 MG RE SUPP: 650.0000 mg | Freq: Four times a day (QID) | RECTAL | Status: DC | PRN

## 2018-10-22 MED ORDER — ENOXAPARIN SODIUM 40 MG/0.4ML ~~LOC~~ SOLN
40.0000 mg | Freq: Two times a day (BID) | SUBCUTANEOUS | Status: DC
  Administered 2018-10-22: 40 mg via SUBCUTANEOUS
  Filled 2018-10-22: qty 0.4

## 2018-10-22 MED ORDER — ACETAMINOPHEN 325 MG PO TABS: 650.0000 mg | ORAL_TABLET | Freq: Four times a day (QID) | ORAL | Status: DC | PRN

## 2018-10-22 MED ORDER — LACTATED RINGERS IV SOLN
INTRAVENOUS | Status: DC
  Administered 2018-10-22: via INTRAVENOUS

## 2018-10-22 MED ORDER — ACETAMINOPHEN 325 MG PO TABS: 650.0000 mg | ORAL_TABLET | Freq: Three times a day (TID) | ORAL | 0 refills | Status: AC | PRN

## 2018-10-22 MED ORDER — LISINOPRIL 20 MG PO TABS
20.0000 mg | ORAL_TABLET | Freq: Every day | ORAL | Status: DC
  Administered 2018-10-22: 20 mg via ORAL
  Filled 2018-10-22: qty 1

## 2018-10-22 MED ORDER — CELECOXIB 200 MG PO CAPS
200.0000 mg | ORAL_CAPSULE | Freq: Two times a day (BID) | ORAL | Status: DC
  Administered 2018-10-22 (×2): 200 mg via ORAL
  Filled 2018-10-22 (×3): qty 1

## 2018-10-22 MED ORDER — HYDROMORPHONE HCL 1 MG/ML IJ SOLN: 1.0000 mg | INTRAMUSCULAR | Status: DC | PRN

## 2018-10-22 NOTE — Anesthesia Postprocedure Evaluation (Signed)
Anesthesia Post Note  Patient: Sean Murillo  Procedure(s) Performed: HERNIA REPAIR VENTRAL ADULT (N/A Abdomen)  Patient location during evaluation: PACU Anesthesia Type: General Level of consciousness: awake and alert Pain management: pain level controlled Vital Signs Assessment: post-procedure vital signs reviewed and stable Respiratory status: spontaneous breathing, nonlabored ventilation and respiratory function stable Cardiovascular status: blood pressure returned to baseline and stable Postop Assessment: no apparent nausea or vomiting Anesthetic complications: no     Last Vitals:  Vitals:   10/22/18 0020 10/22/18 0039  BP: 130/78 125/73  Pulse: 94 97  Resp: 13 18  Temp:  37.1 C  SpO2: 94% 95%    Last Pain:  Vitals:   10/22/18 0039  TempSrc: Oral  PainSc:                  Jovita Gamma

## 2018-10-22 NOTE — Discharge Planning (Signed)
P.t was educated on d/c instructions and all questions have been answered.

## 2018-10-22 NOTE — Discharge Summary (Signed)
Physician Discharge Summary  Patient ID: Sean Murillo MRN: [id number] DOB/AGE: 1975/08/31 43 y.o.  Admit date: 10/21/2018 Discharge date: 10/22/2018  Admission Diagnoses: incarcerated recurrent incisional hernia  Discharge Diagnoses:  Same as above  Discharged Condition: good  Hospital Course: diagnosed with above, underwent urgent open repair with primary closure secondary to lack of tissue planes.  See Op note for further details.  Recovered without any issue.  At time of discharge, pain controlled with oral meds, tolerating diet.  Consults: None  Discharge Exam: Blood pressure 125/80, pulse 76, temperature 97.8 F (36.6 C), temperature source Oral, resp. rate 16, height 5\' 9"  (1.753 m), weight (!) 172.4 kg, SpO2 93 %. General appearance: alert, cooperative and no distress GI: soft, no guarding, appropriate tenderness around incision and JP site, with scant bloody output  Disposition:     Allergies as of 10/22/2018      Reactions   Contrast Media [iodinated Diagnostic Agents] Hives   Iohexol   Shellfish Allergy Hives      Medication List    TAKE these medications   acetaminophen 325 MG tablet Commonly known as:  Tylenol Take 2 tablets (650 mg total) by mouth every 8 (eight) hours as needed for up to 30 days for mild pain.   docusate sodium 100 MG capsule Commonly known as:  Colace Take 1 capsule (100 mg total) by mouth 2 (two) times daily as needed for up to 10 days for mild constipation.   HYDROcodone-acetaminophen 5-325 MG tablet Commonly known as:  NORCO/VICODIN Take 1 tablet by mouth every 6 (six) hours as needed.   ibuprofen 800 MG tablet Commonly known as:  ADVIL Take 1 tablet (800 mg total) by mouth every 8 (eight) hours as needed for mild pain or moderate pain.   lisinopril-hydrochlorothiazide 20-12.5 MG tablet Commonly known as:  ZESTORETIC Take 1 tablet by mouth daily.   sertraline 100 MG tablet Commonly known as:  ZOLOFT Take 200 mg by mouth  daily.      Follow-up Information    Sean Sabina, DO Follow up in 1 week(s).   Specialty:  Surgery Why:  for wound check and possible staple removal Contact information: [address] [place] [address] [phone]            Total time spent arranging discharge was >53min. Signed: Sung Amabile 10/22/2018, 2:12 PM

## 2018-10-25 ENCOUNTER — Telehealth: Payer: Self-pay | Admitting: Podiatry

## 2018-10-25 LAB — SURGICAL PATHOLOGY

## 2018-10-25 NOTE — Telephone Encounter (Signed)
Marylu Lund and [place], have either of you seen anything for this patient?

## 2018-10-25 NOTE — Telephone Encounter (Signed)
[employer]( short term disability)  was to have a note from office visit on 10/05/2018 and Xray results also. Please call case worker Amy at [phone] with any questions

## 2018-11-04 ENCOUNTER — Ambulatory Visit: Payer: 59 | Admitting: Podiatry

## 2018-11-05 ENCOUNTER — Other Ambulatory Visit: Payer: Self-pay

## 2018-11-05 ENCOUNTER — Ambulatory Visit (INDEPENDENT_AMBULATORY_CARE_PROVIDER_SITE_OTHER): Payer: 59

## 2018-11-05 ENCOUNTER — Encounter: Payer: Self-pay | Admitting: Podiatry

## 2018-11-05 ENCOUNTER — Ambulatory Visit (INDEPENDENT_AMBULATORY_CARE_PROVIDER_SITE_OTHER): Payer: 59 | Admitting: Podiatry

## 2018-11-05 VITALS — Temp 97.5°F

## 2018-11-05 DIAGNOSIS — T148XXA Other injury of unspecified body region, initial encounter: Secondary | ICD-10-CM | POA: Diagnosis not present

## 2018-11-05 DIAGNOSIS — S99912A Unspecified injury of left ankle, initial encounter: Secondary | ICD-10-CM

## 2018-11-05 DIAGNOSIS — M779 Enthesopathy, unspecified: Secondary | ICD-10-CM

## 2018-11-05 DIAGNOSIS — S99912D Unspecified injury of left ankle, subsequent encounter: Secondary | ICD-10-CM | POA: Diagnosis not present

## 2018-11-05 MED ORDER — HYDROCODONE-ACETAMINOPHEN 5-325 MG PO TABS: 1.0000 | ORAL_TABLET | Freq: Four times a day (QID) | ORAL | 0 refills | Status: DC | PRN

## 2018-11-07 NOTE — Progress Notes (Signed)
Subjective: 43 year old male presents the office for evaluation of left ankle pain.  Overall he states he is a lot of discomfort but the swelling is improving.  He denies any recent injury or trauma.  He is still using the cam boot external try to stay off of his foot as much as possible.  He states that the unna boots havebeen very helpful. He has no new concerns today. Denies any fevers, chills, nausea, vomiting. No calf pain, chest pain, shortness of breath.   Objective: AAO x3, NAD DP/PT pulses palpable bilaterally, CRT less than 3 seconds There is still localized edema to the lateral aspect ankle however this is much improved.  There is still diffuse tenderness to his ankle as well as the foot.  Majority tenderness of the lateral aspect ankle in the course the ATFL but also achy discomfort on the course of the peroneal tendon just posterior to the lateral malleolus.  Overall the tendon appears to be intact.  Posterior tibial, flexor tendons.  Mild discomfort the dorsal aspect of the foot.  There is no erythema or warmth.  There is no pain with calf compression, swelling, warmth, erythema.  CT Scan: Negative for fracture. Accessory ossicle or remote avulsion off fracture off of the tip of the distal fibula is unchanged since 2017.  Subcutaneous edema about the lateral aspect of the ankle. The anterior talofibular ligament is not visualized and likely torn.  Assessment: Left lateral ankle ligament tear  Plan: -All treatment options discussed with the patient including all alternatives, risks, complications.  -Unna boot was applied today.  Precautions were present underneath this.  He is to come in next week just to get another one repeat reapplied as it has been helpful.  For now continue the cam boot.  Discussed with him starting some gentle range of motion exercises at home.  Hopefully in 2 weeks his symptoms including will start physical therapy.  If no improvement will try for an MRI.   However given metal in his body we may not be able to do so. [name] DPM

## 2018-11-09 ENCOUNTER — Other Ambulatory Visit: Payer: 59

## 2018-11-11 ENCOUNTER — Other Ambulatory Visit: Payer: 59

## 2018-11-19 ENCOUNTER — Other Ambulatory Visit: Payer: Self-pay

## 2018-11-19 ENCOUNTER — Ambulatory Visit (INDEPENDENT_AMBULATORY_CARE_PROVIDER_SITE_OTHER): Payer: 59 | Admitting: Podiatry

## 2018-11-19 ENCOUNTER — Telehealth: Payer: Self-pay | Admitting: *Deleted

## 2018-11-19 ENCOUNTER — Encounter: Payer: Self-pay | Admitting: Podiatry

## 2018-11-19 VITALS — Temp 98.1°F

## 2018-11-19 DIAGNOSIS — M25472 Effusion, left ankle: Secondary | ICD-10-CM

## 2018-11-19 DIAGNOSIS — T148XXA Other injury of unspecified body region, initial encounter: Secondary | ICD-10-CM

## 2018-11-19 DIAGNOSIS — S99912D Unspecified injury of left ankle, subsequent encounter: Secondary | ICD-10-CM | POA: Diagnosis not present

## 2018-11-19 DIAGNOSIS — M779 Enthesopathy, unspecified: Secondary | ICD-10-CM

## 2018-11-19 NOTE — Telephone Encounter (Signed)
Dr. Ardelle Anton states pt has metal plate in jaw - 1537, and back 2015, and would like MRI of left ankle due to CT being inconclusive. [facility] - Victorino Dike states she will connect me with radiology tech - Sarah. [facility] - Maralyn Sago states MRI would be fine in this case with imaged area to be foot or ankle. Orders to [facility] for pre-cert and faxed to [facility].

## 2018-11-23 NOTE — Progress Notes (Signed)
Subjective: 43 year old male presents the office for evaluation of left ankle pain.  He states that overall his pain is about the same.  When he tries to walk in the boot he feels that his ankles going to give out and he feels that he is going to roll his ankle.  The Unna boot did help some.  He is been nonweightbearing the majority of the time. Denies any fevers, chills, nausea, vomiting. No calf pain, chest pain, shortness of breath.   Objective: AAO x3, NAD DP/PT pulses palpable bilaterally, CRT less than 3 seconds There is still localized edema to the lateral aspect ankle however this is much improved.  There is still diffuse tenderness of the ankle and the majority the tenderness appears to be on the course the peroneal tendons just posterior to the lateral malleolus as well as along the ATFL.  There is no erythema or warmth associated with the swelling.  There is no area pinpoint tenderness.  CT Scan: Negative for fracture. Accessory ossicle or remote avulsion off fracture off of the tip of the distal fibula is unchanged since 2017.  Subcutaneous edema about the lateral aspect of the ankle. The anterior talofibular ligament is not visualized and likely torn.  Assessment: Left lateral ankle ligament tear  Plan: -All treatment options discussed with the patient including all alternatives, risks, complications.  -Patient still having instability in the ankle and he is walking improved.  At this point will order an MRI July the peroneal tendon tear.  Also discussed for better surgical planning for possible lateral ankle stabilization to assess the lateral ankle ligaments.  An Unna boot was applied today precautions were advised and remove this.  Continue the cam boot.  He has not been able to weight-bear.  Ice elevation.  Monitor for signs or symptoms of DVT/PE -He does have metal in his jaw as well as his back.  We did contact the imaging center and is able to get the MRI.  Sean Murillo DPM

## 2018-11-24 ENCOUNTER — Encounter: Payer: Self-pay | Admitting: Podiatry

## 2018-11-25 ENCOUNTER — Other Ambulatory Visit: Payer: 59

## 2018-12-02 ENCOUNTER — Telehealth: Payer: Self-pay | Admitting: *Deleted

## 2018-12-02 NOTE — Telephone Encounter (Signed)
[place] - Amy states a letter was faxed on 11/25/2018 and she was calling to see if it had been completed by Dr. Ardelle Anton, fax to [phone].

## 2018-12-09 ENCOUNTER — Telehealth: Payer: Self-pay | Admitting: *Deleted

## 2018-12-09 NOTE — Telephone Encounter (Signed)
[place] - Lelon Frohlich states she is assisting Amy in following up on a letter concerning pt's work status.

## 2018-12-10 ENCOUNTER — Other Ambulatory Visit: Payer: Self-pay

## 2018-12-10 ENCOUNTER — Ambulatory Visit
Admission: RE | Admit: 2018-12-10 | Discharge: 2018-12-10 | Disposition: A | Discharge status: Home / Self Care | Payer: 59 | Source: Ambulatory Visit | Attending: Podiatry | Admitting: Podiatry

## 2018-12-13 ENCOUNTER — Encounter: Payer: Self-pay | Admitting: Podiatry

## 2018-12-20 ENCOUNTER — Encounter: Payer: Self-pay | Admitting: Podiatry

## 2018-12-21 DIAGNOSIS — M79676 Pain in unspecified toe(s): Secondary | ICD-10-CM

## 2019-01-05 ENCOUNTER — Other Ambulatory Visit: Payer: Self-pay

## 2019-01-05 ENCOUNTER — Other Ambulatory Visit: Payer: 59

## 2019-01-05 DIAGNOSIS — Z20822 Contact with and (suspected) exposure to covid-19: Secondary | ICD-10-CM

## 2019-01-09 LAB — NOVEL CORONAVIRUS, NAA: SARS-CoV-2, NAA: NOT DETECTED

## 2019-01-13 ENCOUNTER — Encounter: Payer: Self-pay | Admitting: Podiatry

## 2019-01-13 ENCOUNTER — Ambulatory Visit (INDEPENDENT_AMBULATORY_CARE_PROVIDER_SITE_OTHER): Payer: 59 | Admitting: Podiatry

## 2019-01-13 ENCOUNTER — Other Ambulatory Visit: Payer: Self-pay

## 2019-01-13 VITALS — Temp 97.9°F

## 2019-01-13 DIAGNOSIS — M779 Enthesopathy, unspecified: Secondary | ICD-10-CM

## 2019-01-13 DIAGNOSIS — M19072 Primary osteoarthritis, left ankle and foot: Secondary | ICD-10-CM | POA: Diagnosis not present

## 2019-01-20 NOTE — Progress Notes (Signed)
Subjective: 43 year old male presents the office for evaluation of left ankle pain. He presents today wearing a CAM boot and in a wheelchair.  He states that overall he still having quite a bit of pain.  We did message him about the MRI results.  Discussed physical therapy but he wants to come in to further discuss his options.  He does limit the amount of time is on his feet.  He has no new concerns and no recent falls. Denies any fevers, chills, nausea, vomiting. No calf pain, chest pain, shortness of breath.   Objective: AAO x3, NAD DP/PT pulses palpable bilaterally, CRT less than 3 seconds There is still localized however improved edema to the lateral aspect ankle. There is global tenderness of the left ankle most of the lateral aspect as well as the anterior ankle.  There is no area pinpoint bony tenderness.  There is no erythema or warmth.  No pain with Achilles tendon.  Flexor, extensor tendons appear to be intact. No open lesions. No pain with calf compression, swelling, warmth, erythema. CT Scan: Negative for fracture. Accessory ossicle or remote avulsion off fracture off of the tip of the distal fibula is unchanged since 2017.  Subcutaneous edema about the lateral aspect of the ankle. The anterior talofibular ligament is not visualized and likely torn.  MRI 12/10/2018: IMPRESSION: 1. Mild peroneal tenosynovitis.  No peroneal tendon tear. 2. Mild osteoarthritis of the tibiotalar joint. 3. Mild osteoarthritis of the talonavicular joint.  Assessment: Left ankle chronic pain, osteoarthritis;   Plan: -All treatment options discussed with the patient including all alternatives, risks, complications.  -I again reviewed the CT scan with him as well as the MRI results.  At this time we discussed treatment options.  I do not think surgery is indicated at this time.  Start physical therapy.  [facility] for [facility] was written.  Him to transition to walking in the cam  boot.  Also a Tri-Lock ankle brace was dispensed once he is starting physical therapy low back to regular shoe with ankle brace.  Anti-inflammatories as needed.  Ice elevation. Rx vicodin to use prn  -Due to pain he states he is not able to return to work.  I will keep him out of work for approximately 1 more month no at that point he can return to work.  We will reevaluate prior to this.  Trula Slade DPM

## 2019-02-05 ENCOUNTER — Encounter: Payer: Self-pay | Admitting: Podiatry

## 2019-02-20 ENCOUNTER — Encounter: Payer: Self-pay | Admitting: Podiatry

## 2019-02-24 ENCOUNTER — Encounter: Payer: Self-pay | Admitting: Podiatry

## 2019-02-24 ENCOUNTER — Ambulatory Visit (INDEPENDENT_AMBULATORY_CARE_PROVIDER_SITE_OTHER): Payer: 59 | Admitting: Podiatry

## 2019-02-24 ENCOUNTER — Other Ambulatory Visit: Payer: Self-pay

## 2019-02-24 ENCOUNTER — Telehealth: Payer: Self-pay | Admitting: *Deleted

## 2019-02-24 DIAGNOSIS — M19072 Primary osteoarthritis, left ankle and foot: Secondary | ICD-10-CM

## 2019-02-24 DIAGNOSIS — M25472 Effusion, left ankle: Secondary | ICD-10-CM

## 2019-02-24 DIAGNOSIS — R252 Cramp and spasm: Secondary | ICD-10-CM

## 2019-02-24 DIAGNOSIS — M779 Enthesopathy, unspecified: Secondary | ICD-10-CM | POA: Diagnosis not present

## 2019-02-24 NOTE — Telephone Encounter (Signed)
Orders to [facility].

## 2019-02-24 NOTE — Telephone Encounter (Signed)
-----   Message from Trula Slade, DPM sent at 02/24/2019  3:28 PM EDT ----- Can you please order a venous duplex due to left leg pain, cramping?

## 2019-03-01 ENCOUNTER — Ambulatory Visit (HOSPITAL_COMMUNITY): Payer: 59

## 2019-03-02 NOTE — Progress Notes (Signed)
Subjective: 43 year old male presents the office for evaluation of left ankle pain.  He states he is still having pain in the ankle.  Doing physical therapy but not seeing much improvement.  He is wearing a shoe with an ankle brace.  He states the pain is been going up the back of his leg at times.  He has limited his weightbearing.  He presents today in a wheelchair. Denies any fevers, chills, nausea, vomiting. No calf pain, chest pain, shortness of breath.   Objective: AAO x3, NAD DP/PT pulses palpable bilaterally, CRT less than 3 seconds Majority tenderness is still on the lateral aspect of the ankle.  Mild swelling there is no erythema or warmth.  There is no area pinpoint tenderness.  No gross ankle instability present. No pain with calf compression, swelling, warmth, erythema.  He is describing some pain to the right calf with mild cramping but no symptoms today.  CT Scan: Negative for fracture. Accessory ossicle or remote avulsion off fracture off of the tip of the distal fibula is unchanged since 2017.  Subcutaneous edema about the lateral aspect of the ankle. The anterior talofibular ligament is not visualized and likely torn.  MRI 12/10/2018: IMPRESSION: 1. Mild peroneal tenosynovitis.  No peroneal tendon tear. 2. Mild osteoarthritis of the tibiotalar joint. 3. Mild osteoarthritis of the talonavicular joint.  Assessment: Left ankle chronic pain, osteoarthritis; capsulitis  Plan: -All treatment options discussed with the patient including all alternatives, risks, complications.  -Steroid injection performed the left ankle.  See procedure note below.  On continue physical therapy.  Only to gradually increase activity level.  He states that he cannot return to work because the discomfort he cannot stand for long periods of time.  Because of this he is also going to mount a work. -Venous duplex rule out DVT.  Unable to get scheduled.  Recommended to go to [facility].    Procedure: Injection intermediate joint Discussed alternatives, risks, complications and verbal consent was obtained.  Location:  Left anterior lateral ankle joint Skin Prep: Betadine (states he has had before without issue) Injectate: 0.5cc 0.5% marcaine plain, 0.5 cc 2% lidocaine plain and, 1 cc kenalog 10. Disposition: Patient tolerated procedure well. Injection site dressed with a band-aid.  Post-injection care was discussed and return precautions discussed.    Trula Slade DPM

## 2019-03-17 ENCOUNTER — Ambulatory Visit: Payer: 59 | Admitting: Podiatry

## 2019-03-18 ENCOUNTER — Other Ambulatory Visit: Payer: Self-pay

## 2019-03-18 ENCOUNTER — Ambulatory Visit (HOSPITAL_COMMUNITY)
Admission: RE | Admit: 2019-03-18 | Discharge: 2019-03-18 | Disposition: A | Discharge status: Home / Self Care | Payer: 59 | Source: Ambulatory Visit | Attending: Podiatry | Admitting: Podiatry

## 2019-03-18 DIAGNOSIS — R252 Cramp and spasm: Secondary | ICD-10-CM | POA: Diagnosis present

## 2019-03-18 DIAGNOSIS — M25472 Effusion, left ankle: Secondary | ICD-10-CM | POA: Insufficient documentation

## 2019-03-22 ENCOUNTER — Telehealth: Payer: Self-pay | Admitting: *Deleted

## 2019-03-22 NOTE — Telephone Encounter (Signed)
-----   Message from Trula Slade, DPM sent at 03/22/2019  1:14 PM EDT ----- Negative for DVT- can you please let him know? thanks

## 2019-03-22 NOTE — Telephone Encounter (Signed)
I informed pt of DR. Wagoner's review of results. 

## 2019-03-31 ENCOUNTER — Ambulatory Visit: Payer: 59 | Admitting: Podiatry

## 2019-04-01 ENCOUNTER — Encounter: Payer: Self-pay | Admitting: Podiatry

## 2019-04-25 ENCOUNTER — Encounter: Payer: Self-pay | Admitting: Podiatry

## 2019-04-25 ENCOUNTER — Other Ambulatory Visit: Payer: Self-pay

## 2019-04-25 ENCOUNTER — Ambulatory Visit (INDEPENDENT_AMBULATORY_CARE_PROVIDER_SITE_OTHER): Payer: 59 | Admitting: Podiatry

## 2019-04-25 DIAGNOSIS — M19072 Primary osteoarthritis, left ankle and foot: Secondary | ICD-10-CM

## 2019-04-25 DIAGNOSIS — M779 Enthesopathy, unspecified: Secondary | ICD-10-CM | POA: Diagnosis not present

## 2019-04-25 DIAGNOSIS — S99912D Unspecified injury of left ankle, subsequent encounter: Secondary | ICD-10-CM

## 2019-04-29 NOTE — Progress Notes (Signed)
Subjective: 43 year old male presents the office today for follow evaluation of left ankle pain.  He states that it is not getting much better and he still having discomfort.  He points more to the sinus tarsi or inferior fibular where he gets the majority of tenderness.  He did complete physical therapy.  Denies any systemic complaints such as fevers, chills, nausea, vomiting. No acute changes since last appointment, and no other complaints at this time.   He also states he has been having chronic ankle pain for several years since he has been in the [employer].  However since this recent injury his pain worsened but has had ongoing issues for quite some time to both of his ankles.  He presents in wheelchair again today.  Upon discussion with him he states the use of the wheelchair because of chronic back pain.  He has had 2 back surgeries and still having back pain and difficulty walking.  Objective: AAO x3, NAD-obese DP/PT pulses palpable bilaterally, CRT less than 3 seconds Tenderness of the ATFL of the left ankle.  Also discomfort on the course of the sinus tarsi.  There is no gross ankle instability present.  MMT 5/5.  Minimal swelling.  No erythema or warmth.  Achilles tendon, plantar pressure, flexor, extensor tendons appear to be intact. No pain with calf compression, swelling, warmth, erythema  Assessment: Capsulitis subtalar joint, chronic ankle pain  Plan: -All treatment options discussed with the patient including all alternatives, risks, complications.  -Steroid injection performed to the sinus tarsi today.  See procedure note below. -Had a long discussion in regards to surgical intervention.  At this time given his ongoing back issues and his needing to be nonweightbearing after the surgery I would recommend however conservative for now.  He has been out of work for last couple of years because of his back. -Patient encouraged to call the office with any questions, concerns, change  in symptoms.   Procedure: Injection intermediate joint Discussed alternatives, risks, complications and verbal consent was obtained.  Location: Left sinus tarsi Skin Prep: Betadine Injectate: 0.5cc 0.5% marcaine plain, 0.5 cc 2% lidocaine plain and, 1 cc kenalog 10. Disposition: Patient tolerated procedure well. Injection site dressed with a band-aid.  Post-injection care was discussed and return precautions discussed.   Return in about 4 weeks (around 05/23/2019).  Trula Slade DPM

## 2019-05-11 ENCOUNTER — Other Ambulatory Visit: Payer: Self-pay | Admitting: Neurosurgery

## 2019-05-17 ENCOUNTER — Other Ambulatory Visit: Payer: Self-pay | Admitting: Neurosurgery

## 2019-05-19 NOTE — Pre-Procedure Instructions (Signed)
[facility] [phone] - [place], [place] - [address] AT [facility] [address] [place] [address] Phone: [phone] Fax: [phone]  [id number] - [place], [place] - [address] AT [facility] [address]  [place] [address] Phone: [phone] Fax: [phone]      Your procedure is scheduled on Tuesday, November 24 from 07:30 AM to 09:09 AM.  Report to [facility] Entrance "A" at 05:30 A.M., and check in at the Admitting office.  Call this number if you have problems the morning of surgery:  [phone]    Remember:  Do not eat or drink after midnight the night before your surgery     Take these medicines the morning of surgery with A SIP OF WATER: sertraline (ZOLOFT)  gabapentin (NEURONTIN) - if needed   As of today, STOP taking any Aspirin (unless otherwise instructed by your surgeon), Aleve, Naproxen, Ibuprofen, Motrin, Advil, Goody's, BC's, all herbal medications, fish oil, and all vitamins.    The Morning of Surgery  Do not wear jewelry.  Do not wear lotions, powders, colognes, or deodorant  Men may shave face and neck.  Do not bring valuables to the hospital.  [facility] is not responsible for any belongings or valuables.  If you are a smoker, DO NOT Smoke 24 hours prior to surgery  If you wear a CPAP at night please bring your mask, tubing, and machine the morning of surgery   Remember that you must have someone to transport you home after your surgery, and remain with you for 24 hours if you are discharged the same day.   Please bring cases for contacts, glasses, hearing aids, dentures or bridgework because it cannot be worn into surgery.    Leave your suitcase in the car.  After surgery it may be brought to your room.  For patients admitted to the hospital, discharge time will be determined by your treatment team.  Patients discharged the day of surgery will not be allowed to drive  home.    Special instructions:   [place]- Preparing For Surgery  Before surgery, you can play an important role. Because skin is not sterile, your skin needs to be as free of germs as possible. You can reduce the number of germs on your skin by washing with CHG (chlorahexidine gluconate) Soap before surgery.  CHG is an antiseptic cleaner which kills germs and bonds with the skin to continue killing germs even after washing.    Please do not use if you have an allergy to CHG or antibacterial soaps. If your skin becomes reddened/irritated stop using the CHG.  Do not shave (including legs and underarms) for at least 48 hours prior to first CHG shower. It is OK to shave your face.  Please follow these instructions carefully.   1. Shower the NIGHT BEFORE SURGERY and the MORNING OF SURGERY with CHG Soap.   2. If you chose to wash your hair, wash your hair first as usual with your normal shampoo.  3. After you shampoo, rinse your hair and body thoroughly to remove the shampoo.  4. Use CHG as you would any other liquid soap. You can apply CHG directly to the skin and wash gently with a scrungie or a clean washcloth.   5. Apply the CHG Soap to your body ONLY FROM THE NECK DOWN.  Do not use on open wounds or open sores. Avoid contact with your eyes, ears, mouth and  genitals (private parts). Wash Face and genitals (private parts)  with your normal soap.   6. Wash thoroughly, paying special attention to the area where your surgery will be performed.  7. Thoroughly rinse your body with warm water from the neck down.  8. DO NOT shower/wash with your normal soap after using and rinsing off the CHG Soap.  9. Pat yourself dry with a CLEAN TOWEL.  10. Wear CLEAN PAJAMAS to bed the night before surgery, wear comfortable clothes the morning of surgery  11. Place CLEAN SHEETS on your bed the night of your first shower and DO NOT SLEEP WITH PETS.    Day of Surgery:   Remember to brush your teeth  WITH YOUR REGULAR TOOTHPASTE. Please shower the morning of surgery with the CHG soap Do not apply any deodorants/lotions. Please wear clean clothes to the hospital/surgery center.      Please read over the following fact sheets that you were given.

## 2019-05-20 ENCOUNTER — Other Ambulatory Visit: Payer: Self-pay

## 2019-05-20 ENCOUNTER — Other Ambulatory Visit (HOSPITAL_COMMUNITY)
Admission: RE | Admit: 2019-05-20 | Discharge: 2019-05-20 | Disposition: A | Discharge status: Home / Self Care | Payer: 59 | Source: Ambulatory Visit | Attending: Neurosurgery | Admitting: Neurosurgery

## 2019-05-20 ENCOUNTER — Encounter (HOSPITAL_COMMUNITY)
Admission: RE | Admit: 2019-05-20 | Discharge: 2019-05-20 | Disposition: A | Discharge status: Home / Self Care | Payer: 59 | Source: Ambulatory Visit | Attending: Neurosurgery | Admitting: Neurosurgery

## 2019-05-20 ENCOUNTER — Encounter (HOSPITAL_COMMUNITY): Payer: Self-pay

## 2019-05-20 DIAGNOSIS — I10 Essential (primary) hypertension: Secondary | ICD-10-CM | POA: Diagnosis not present

## 2019-05-20 DIAGNOSIS — G4730 Sleep apnea, unspecified: Secondary | ICD-10-CM | POA: Insufficient documentation

## 2019-05-20 DIAGNOSIS — R9431 Abnormal electrocardiogram [ECG] [EKG]: Secondary | ICD-10-CM | POA: Insufficient documentation

## 2019-05-20 DIAGNOSIS — Z91041 Radiographic dye allergy status: Secondary | ICD-10-CM | POA: Insufficient documentation

## 2019-05-20 DIAGNOSIS — Z981 Arthrodesis status: Secondary | ICD-10-CM | POA: Diagnosis not present

## 2019-05-20 DIAGNOSIS — Z9081 Acquired absence of spleen: Secondary | ICD-10-CM | POA: Insufficient documentation

## 2019-05-20 DIAGNOSIS — G4733 Obstructive sleep apnea (adult) (pediatric): Secondary | ICD-10-CM | POA: Diagnosis not present

## 2019-05-20 DIAGNOSIS — F4310 Post-traumatic stress disorder, unspecified: Secondary | ICD-10-CM | POA: Insufficient documentation

## 2019-05-20 DIAGNOSIS — Z79899 Other long term (current) drug therapy: Secondary | ICD-10-CM | POA: Insufficient documentation

## 2019-05-20 DIAGNOSIS — X58XXXA Exposure to other specified factors, initial encounter: Secondary | ICD-10-CM | POA: Insufficient documentation

## 2019-05-20 DIAGNOSIS — Z20828 Contact with and (suspected) exposure to other viral communicable diseases: Secondary | ICD-10-CM | POA: Diagnosis not present

## 2019-05-20 DIAGNOSIS — Z87442 Personal history of urinary calculi: Secondary | ICD-10-CM | POA: Diagnosis not present

## 2019-05-20 DIAGNOSIS — Z823 Family history of stroke: Secondary | ICD-10-CM | POA: Diagnosis not present

## 2019-05-20 DIAGNOSIS — Z6841 Body Mass Index (BMI) 40.0 and over, adult: Secondary | ICD-10-CM | POA: Insufficient documentation

## 2019-05-20 DIAGNOSIS — F329 Major depressive disorder, single episode, unspecified: Secondary | ICD-10-CM | POA: Diagnosis not present

## 2019-05-20 DIAGNOSIS — M545 Low back pain: Secondary | ICD-10-CM | POA: Insufficient documentation

## 2019-05-20 DIAGNOSIS — T8484XA Pain due to internal orthopedic prosthetic devices, implants and grafts, initial encounter: Secondary | ICD-10-CM | POA: Insufficient documentation

## 2019-05-20 DIAGNOSIS — Z01818 Encounter for other preprocedural examination: Secondary | ICD-10-CM | POA: Insufficient documentation

## 2019-05-20 DIAGNOSIS — Z91013 Allergy to seafood: Secondary | ICD-10-CM | POA: Diagnosis not present

## 2019-05-20 HISTORY — DX: Unspecified asthma, uncomplicated: J45.909

## 2019-05-20 HISTORY — DX: Personal history of other diseases of the digestive system: Z87.19

## 2019-05-20 LAB — TYPE AND SCREEN
ABO/RH(D): O POS
Antibody Screen: NEGATIVE

## 2019-05-20 LAB — BASIC METABOLIC PANEL
Anion gap: 10 (ref 5–15)
BUN: 7 mg/dL (ref 6–20)
CO2: 27 mmol/L (ref 22–32)
Calcium: 9 mg/dL (ref 8.9–10.3)
Chloride: 103 mmol/L (ref 98–111)
Creatinine, Ser: 0.69 mg/dL (ref 0.61–1.24)
GFR calc Af Amer: >60 mL/min (ref >60)
GFR calc non Af Amer: >60 mL/min (ref >60)
Glucose, Bld: 101 mg/dL — ABNORMAL HIGH (ref 70–99)
Potassium: 3.5 mmol/L (ref 3.5–5.1)
Sodium: 140 mmol/L (ref 135–145)

## 2019-05-20 LAB — SURGICAL PCR SCREEN
MRSA, PCR: NEGATIVE
Staphylococcus aureus: POSITIVE — AB

## 2019-05-20 LAB — CBC
HCT: 43.3 % (ref 39.0–52.0)
Hemoglobin: 15.1 g/dL (ref 13.0–17.0)
MCH: 28.2 pg (ref 26.0–34.0)
MCHC: 34.9 g/dL (ref 30.0–36.0)
MCV: 80.9 fL (ref 80.0–100.0)
Platelets: 409 10*3/uL — ABNORMAL HIGH (ref 150–400)
RBC: 5.35 MIL/uL (ref 4.22–5.81)
RDW: 15 % (ref 11.5–15.5)
WBC: 13.8 10*3/uL — ABNORMAL HIGH (ref 4.0–10.5)
nRBC: 0 % (ref 0.0–0.2)

## 2019-05-20 NOTE — Progress Notes (Addendum)
PCP - Jake Samples, PA-C with [facility] in [place] [facility] [place] - Denies  PPM/ICD - Denies  Chest x-ray - N/A EKG - 05/20/2019 Stress Test - >5 years ago @ [facility] per patient-- negative test result ECHO - 07/14/17, done at the [place] - Denies  Sleep Study - Yes CPAP - Yes  Patient denies being diabetic  Blood Thinner Instructions: N/A Aspirin Instructions: N/A  ERAS Protcol - No  COVID TEST- 05/20/2019   Anesthesia review: Yes, Stress Test records requested from [facility]  Patient denies shortness of breath, fever, cough and chest pain at PAT appointment  Coronavirus Screening  Have you experienced the following symptoms:  Cough yes/no: No Fever (>100.55F)  yes/no: No Runny nose yes/no: No Sore throat yes/no: No Difficulty breathing/shortness of breath  yes/no: No Have you or a family member traveled in the last 14 days and where? yes/no: No   If the patient indicates "YES" to the above questions, their PAT will be rescheduled to limit the exposure to others and, the surgeon will be notified. THE PATIENT WILL NEED TO BE ASYMPTOMATIC FOR 14 DAYS.   If the patient is not experiencing any of these symptoms, the PAT nurse will instruct them to NOT bring anyone with them to their appointment since they may have these symptoms or traveled as well.   Please remind your patients and families that hospital visitation restrictions are in effect and the importance of the restrictions.    All instructions explained to the patient, with a verbal understanding of the material. Patient agrees to go over the instructions while at home for a better understanding. Patient also instructed to self quarantine after being tested for COVID-19. The opportunity to ask questions was provided.

## 2019-05-22 LAB — NOVEL CORONAVIRUS, NAA (HOSP ORDER, SEND-OUT TO REF LAB; TAT 18-24 HRS): SARS-CoV-2, NAA: NOT DETECTED

## 2019-05-23 ENCOUNTER — Ambulatory Visit: Payer: 59 | Admitting: Podiatry

## 2019-05-23 MED ORDER — DEXTROSE 5 % IV SOLN
3.0000 g | INTRAVENOUS | Status: AC
  Administered 2019-05-24: 3 g via INTRAVENOUS
  Filled 2019-05-23: qty 3000
  Filled 2019-05-23: qty 3

## 2019-05-23 NOTE — Anesthesia Preprocedure Evaluation (Addendum)
Anesthesia Evaluation  Patient identified by MRN, date of birth, ID band Patient awake    Reviewed: Allergy & Precautions, NPO status , Patient's Chart, lab work & pertinent test results  Airway Mallampati: II  TM Distance: >3 FB Neck ROM: Full    Dental no notable dental hx.    Pulmonary sleep apnea and Continuous Positive Airway Pressure Ventilation ,    Pulmonary exam normal breath sounds clear to auscultation       Cardiovascular hypertension, Normal cardiovascular exam Rhythm:Regular Rate:Normal     Neuro/Psych negative neurological ROS  negative psych ROS   GI/Hepatic Neg liver ROS, hiatal hernia,   Endo/Other  Morbid obesity  Renal/GU negative Renal ROS  negative genitourinary   Musculoskeletal negative musculoskeletal ROS (+)   Abdominal (+) + obese,   Peds negative pediatric ROS (+)  Hematology negative hematology ROS (+)   Anesthesia Other Findings   Reproductive/Obstetrics negative OB ROS                           Anesthesia Physical Anesthesia Plan  ASA: IV  Anesthesia Plan: General   Post-op Pain Management:    Induction: Intravenous and Rapid sequence  PONV Risk Score and Plan: 2 and Ondansetron, Dexamethasone and Treatment may vary due to age or medical condition  Airway Management Planned: Oral ETT and Video Laryngoscope Planned  Additional Equipment:   Intra-op Plan:   Post-operative Plan: Extubation in OR  Informed Consent: I have reviewed the patients History and Physical, chart, labs and discussed the procedure including the risks, benefits and alternatives for the proposed anesthesia with the patient or authorized representative who has indicated his/her understanding and acceptance.     Dental advisory given  Plan Discussed with: CRNA and Surgeon  Anesthesia Plan Comments: (See APP note by Durel Salts, FNP)      Anesthesia Quick Evaluation
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
## 2019-05-24 ENCOUNTER — Inpatient Hospital Stay (HOSPITAL_COMMUNITY)
Admission: RE | Admit: 2019-05-24 | Discharge: 2019-05-24 | DRG: 496 | Disposition: A | Discharge status: Home / Self Care | Payer: 59 | Attending: Neurosurgery | Admitting: Neurosurgery

## 2019-05-24 ENCOUNTER — Inpatient Hospital Stay (HOSPITAL_COMMUNITY): Payer: 59 | Admitting: Certified Registered Nurse Anesthetist

## 2019-05-24 ENCOUNTER — Encounter (HOSPITAL_COMMUNITY): Payer: Self-pay

## 2019-05-24 ENCOUNTER — Other Ambulatory Visit: Payer: Self-pay

## 2019-05-24 ENCOUNTER — Inpatient Hospital Stay (HOSPITAL_COMMUNITY): Payer: 59 | Admitting: Emergency Medicine

## 2019-05-24 ENCOUNTER — Inpatient Hospital Stay (HOSPITAL_COMMUNITY): Payer: 59

## 2019-05-24 ENCOUNTER — Encounter (HOSPITAL_COMMUNITY)
Admission: RE | Disposition: A | Discharge status: Home / Self Care | Payer: Self-pay | Source: Home / Self Care | Attending: Neurosurgery

## 2019-05-24 DIAGNOSIS — Z6841 Body Mass Index (BMI) 40.0 and over, adult: Secondary | ICD-10-CM | POA: Diagnosis not present

## 2019-05-24 DIAGNOSIS — F17290 Nicotine dependence, other tobacco product, uncomplicated: Secondary | ICD-10-CM | POA: Diagnosis present

## 2019-05-24 DIAGNOSIS — Z419 Encounter for procedure for purposes other than remedying health state, unspecified: Secondary | ICD-10-CM

## 2019-05-24 DIAGNOSIS — Z981 Arthrodesis status: Secondary | ICD-10-CM | POA: Diagnosis not present

## 2019-05-24 DIAGNOSIS — T8484XA Pain due to internal orthopedic prosthetic devices, implants and grafts, initial encounter: Secondary | ICD-10-CM | POA: Diagnosis present

## 2019-05-24 DIAGNOSIS — Z823 Family history of stroke: Secondary | ICD-10-CM

## 2019-05-24 DIAGNOSIS — Z91013 Allergy to seafood: Secondary | ICD-10-CM

## 2019-05-24 DIAGNOSIS — Y831 Surgical operation with implant of artificial internal device as the cause of abnormal reaction of the patient, or of later complication, without mention of misadventure at the time of the procedure: Secondary | ICD-10-CM | POA: Diagnosis present

## 2019-05-24 DIAGNOSIS — I10 Essential (primary) hypertension: Secondary | ICD-10-CM | POA: Diagnosis present

## 2019-05-24 DIAGNOSIS — Z91041 Radiographic dye allergy status: Secondary | ICD-10-CM

## 2019-05-24 DIAGNOSIS — G4730 Sleep apnea, unspecified: Secondary | ICD-10-CM | POA: Diagnosis present

## 2019-05-24 HISTORY — PX: HARDWARE REMOVAL: SHX979

## 2019-05-24 SURGERY — REMOVAL, HARDWARE
Anesthesia: General | Site: Spine Lumbar

## 2019-05-24 MED ORDER — DOCUSATE SODIUM 100 MG PO CAPS: 100.0000 mg | ORAL_CAPSULE | Freq: Two times a day (BID) | ORAL | Status: DC

## 2019-05-24 MED ORDER — CHLORHEXIDINE GLUCONATE CLOTH 2 % EX PADS: 6.0000 | MEDICATED_PAD | Freq: Once | CUTANEOUS | Status: DC

## 2019-05-24 MED ORDER — BISACODYL 10 MG RE SUPP: 10.0000 mg | Freq: Every day | RECTAL | Status: DC | PRN

## 2019-05-24 MED ORDER — SUCCINYLCHOLINE CHLORIDE 200 MG/10ML IV SOSY
PREFILLED_SYRINGE | INTRAVENOUS | Status: DC | PRN
  Administered 2019-05-24: 180 mg via INTRAVENOUS

## 2019-05-24 MED ORDER — LISINOPRIL-HYDROCHLOROTHIAZIDE 20-12.5 MG PO TABS: 1.0000 | ORAL_TABLET | Freq: Every day | ORAL | Status: DC

## 2019-05-24 MED ORDER — MIDAZOLAM HCL 2 MG/2ML IJ SOLN
INTRAMUSCULAR | Status: AC
  Filled 2019-05-24: qty 2

## 2019-05-24 MED ORDER — ONDANSETRON HCL 4 MG/2ML IJ SOLN
INTRAMUSCULAR | Status: AC
  Filled 2019-05-24: qty 2

## 2019-05-24 MED ORDER — METHOCARBAMOL 500 MG PO TABS
500.0000 mg | ORAL_TABLET | Freq: Four times a day (QID) | ORAL | Status: DC | PRN
  Administered 2019-05-24: 500 mg via ORAL
  Filled 2019-05-24: qty 1

## 2019-05-24 MED ORDER — SENNA 8.6 MG PO TABS: 1.0000 | ORAL_TABLET | Freq: Two times a day (BID) | ORAL | Status: DC

## 2019-05-24 MED ORDER — FENTANYL CITRATE (PF) 250 MCG/5ML IJ SOLN
INTRAMUSCULAR | Status: DC | PRN
  Administered 2019-05-24 (×2): 50 ug via INTRAVENOUS
  Administered 2019-05-24: 100 ug via INTRAVENOUS
  Administered 2019-05-24: 50 ug via INTRAVENOUS

## 2019-05-24 MED ORDER — SUGAMMADEX SODIUM 500 MG/5ML IV SOLN
INTRAVENOUS | Status: AC
  Filled 2019-05-24: qty 5

## 2019-05-24 MED ORDER — BACITRACIN ZINC 500 UNIT/GM EX OINT
TOPICAL_OINTMENT | CUTANEOUS | Status: DC | PRN
  Administered 2019-05-24: 1 via TOPICAL

## 2019-05-24 MED ORDER — LIDOCAINE-EPINEPHRINE 1 %-1:100000 IJ SOLN
INTRAMUSCULAR | Status: DC | PRN
  Administered 2019-05-24: 5 mL

## 2019-05-24 MED ORDER — LIDOCAINE-EPINEPHRINE 1 %-1:100000 IJ SOLN
INTRAMUSCULAR | Status: AC
  Filled 2019-05-24: qty 1

## 2019-05-24 MED ORDER — ACETAMINOPHEN 500 MG PO TABS
1000.0000 mg | ORAL_TABLET | Freq: Four times a day (QID) | ORAL | Status: DC
  Administered 2019-05-24: 1000 mg via ORAL
  Filled 2019-05-24: qty 2

## 2019-05-24 MED ORDER — PROMETHAZINE HCL 25 MG/ML IJ SOLN: 6.2500 mg | INTRAMUSCULAR | Status: DC | PRN

## 2019-05-24 MED ORDER — SODIUM CHLORIDE 0.9% FLUSH: 3.0000 mL | INTRAVENOUS | Status: DC | PRN

## 2019-05-24 MED ORDER — PROPOFOL 10 MG/ML IV BOLUS
INTRAVENOUS | Status: DC | PRN
  Administered 2019-05-24: 200 mg via INTRAVENOUS
  Administered 2019-05-24: 50 mg via INTRAVENOUS

## 2019-05-24 MED ORDER — SENNOSIDES-DOCUSATE SODIUM 8.6-50 MG PO TABS: 1.0000 | ORAL_TABLET | Freq: Every evening | ORAL | Status: DC | PRN

## 2019-05-24 MED ORDER — EPHEDRINE 5 MG/ML INJ
INTRAVENOUS | Status: AC
  Filled 2019-05-24: qty 10

## 2019-05-24 MED ORDER — HYDROMORPHONE HCL 1 MG/ML IJ SOLN: 0.2500 mg | INTRAMUSCULAR | Status: DC | PRN

## 2019-05-24 MED ORDER — SERTRALINE HCL 100 MG PO TABS: 200.0000 mg | ORAL_TABLET | Freq: Every day | ORAL | Status: DC

## 2019-05-24 MED ORDER — LACTATED RINGERS IV SOLN
INTRAVENOUS | Status: DC | PRN
  Administered 2019-05-24 (×2): via INTRAVENOUS

## 2019-05-24 MED ORDER — ACETAMINOPHEN 10 MG/ML IV SOLN: 1000.0000 mg | Freq: Once | INTRAVENOUS | Status: DC | PRN

## 2019-05-24 MED ORDER — ROCURONIUM BROMIDE 10 MG/ML (PF) SYRINGE
PREFILLED_SYRINGE | INTRAVENOUS | Status: AC
  Filled 2019-05-24: qty 10

## 2019-05-24 MED ORDER — OXYCODONE HCL 5 MG PO TABS
5.0000 mg | ORAL_TABLET | ORAL | Status: DC | PRN
  Administered 2019-05-24: 13:00:00 10 mg via ORAL
  Filled 2019-05-24: qty 2

## 2019-05-24 MED ORDER — BUPIVACAINE HCL (PF) 0.5 % IJ SOLN
INTRAMUSCULAR | Status: DC | PRN
  Administered 2019-05-24: 5 mL

## 2019-05-24 MED ORDER — ACETAMINOPHEN 325 MG PO TABS: 650.0000 mg | ORAL_TABLET | ORAL | Status: DC | PRN

## 2019-05-24 MED ORDER — PHENYLEPHRINE HCL-NACL 10-0.9 MG/250ML-% IV SOLN
INTRAVENOUS | Status: DC | PRN
  Administered 2019-05-24: 20 ug/min via INTRAVENOUS

## 2019-05-24 MED ORDER — PHENOL 1.4 % MT LIQD: 1.0000 | OROMUCOSAL | Status: DC | PRN

## 2019-05-24 MED ORDER — 0.9 % SODIUM CHLORIDE (POUR BTL) OPTIME
TOPICAL | Status: DC | PRN
  Administered 2019-05-24: 1000 mL

## 2019-05-24 MED ORDER — EPHEDRINE SULFATE-NACL 50-0.9 MG/10ML-% IV SOSY
PREFILLED_SYRINGE | INTRAVENOUS | Status: DC | PRN
  Administered 2019-05-24: 10 mg via INTRAVENOUS
  Administered 2019-05-24: 5 mg via INTRAVENOUS

## 2019-05-24 MED ORDER — SODIUM CHLORIDE 0.9 % IV SOLN: 250.0000 mL | INTRAVENOUS | Status: DC

## 2019-05-24 MED ORDER — PROPOFOL 10 MG/ML IV BOLUS
INTRAVENOUS | Status: AC
  Filled 2019-05-24: qty 40

## 2019-05-24 MED ORDER — ONDANSETRON HCL 4 MG/2ML IJ SOLN
INTRAMUSCULAR | Status: DC | PRN
  Administered 2019-05-24: 4 mg via INTRAVENOUS

## 2019-05-24 MED ORDER — SUCCINYLCHOLINE CHLORIDE 200 MG/10ML IV SOSY
PREFILLED_SYRINGE | INTRAVENOUS | Status: AC
  Filled 2019-05-24: qty 10

## 2019-05-24 MED ORDER — DEXAMETHASONE SODIUM PHOSPHATE 10 MG/ML IJ SOLN
INTRAMUSCULAR | Status: DC | PRN
  Administered 2019-05-24: 10 mg via INTRAVENOUS

## 2019-05-24 MED ORDER — LISINOPRIL 20 MG PO TABS: 20.0000 mg | ORAL_TABLET | Freq: Every day | ORAL | Status: DC

## 2019-05-24 MED ORDER — SODIUM CHLORIDE 0.9 % IV SOLN: INTRAVENOUS | Status: DC

## 2019-05-24 MED ORDER — METHOCARBAMOL 1000 MG/10ML IJ SOLN
500.0000 mg | Freq: Four times a day (QID) | INTRAVENOUS | Status: DC | PRN
  Filled 2019-05-24: qty 5

## 2019-05-24 MED ORDER — HYDROCODONE-ACETAMINOPHEN 5-325 MG PO TABS: 1.0000 | ORAL_TABLET | ORAL | Status: DC | PRN

## 2019-05-24 MED ORDER — TRAZODONE HCL 100 MG PO TABS
100.0000 mg | ORAL_TABLET | Freq: Every evening | ORAL | Status: DC | PRN
  Filled 2019-05-24: qty 1

## 2019-05-24 MED ORDER — MENTHOL 3 MG MT LOZG: 1.0000 | LOZENGE | OROMUCOSAL | Status: DC | PRN

## 2019-05-24 MED ORDER — CEFAZOLIN SODIUM-DEXTROSE 2-4 GM/100ML-% IV SOLN: 2.0000 g | Freq: Three times a day (TID) | INTRAVENOUS | Status: DC

## 2019-05-24 MED ORDER — DEXAMETHASONE SODIUM PHOSPHATE 10 MG/ML IJ SOLN
INTRAMUSCULAR | Status: AC
  Filled 2019-05-24: qty 1

## 2019-05-24 MED ORDER — ROCURONIUM BROMIDE 10 MG/ML (PF) SYRINGE
PREFILLED_SYRINGE | INTRAVENOUS | Status: DC | PRN
  Administered 2019-05-24: 30 mg via INTRAVENOUS
  Administered 2019-05-24: 70 mg via INTRAVENOUS

## 2019-05-24 MED ORDER — HYDROMORPHONE HCL 1 MG/ML IJ SOLN
0.5000 mg | INTRAMUSCULAR | Status: DC | PRN
  Administered 2019-05-24: 0.5 mg via INTRAVENOUS
  Filled 2019-05-24: qty 0.5

## 2019-05-24 MED ORDER — HYDROMORPHONE HCL 1 MG/ML IJ SOLN
INTRAMUSCULAR | Status: AC
  Administered 2019-05-24: 10:00:00 0.5 mg via INTRAVENOUS
  Filled 2019-05-24: qty 1

## 2019-05-24 MED ORDER — LIDOCAINE 2% (20 MG/ML) 5 ML SYRINGE
INTRAMUSCULAR | Status: AC
  Filled 2019-05-24: qty 5

## 2019-05-24 MED ORDER — LIDOCAINE 2% (20 MG/ML) 5 ML SYRINGE
INTRAMUSCULAR | Status: DC | PRN
  Administered 2019-05-24: 100 mg via INTRAVENOUS

## 2019-05-24 MED ORDER — ACETAMINOPHEN 650 MG RE SUPP: 650.0000 mg | RECTAL | Status: DC | PRN

## 2019-05-24 MED ORDER — PHENYLEPHRINE 40 MCG/ML (10ML) SYRINGE FOR IV PUSH (FOR BLOOD PRESSURE SUPPORT)
PREFILLED_SYRINGE | INTRAVENOUS | Status: AC
  Filled 2019-05-24: qty 10

## 2019-05-24 MED ORDER — THROMBIN 5000 UNITS EX SOLR
CUTANEOUS | Status: AC
  Filled 2019-05-24: qty 5000

## 2019-05-24 MED ORDER — BACITRACIN ZINC 500 UNIT/GM EX OINT
TOPICAL_OINTMENT | CUTANEOUS | Status: AC
  Filled 2019-05-24: qty 28.35

## 2019-05-24 MED ORDER — SUGAMMADEX SODIUM 200 MG/2ML IV SOLN
INTRAVENOUS | Status: DC | PRN
  Administered 2019-05-24: 400 mg via INTRAVENOUS

## 2019-05-24 MED ORDER — ONDANSETRON HCL 4 MG/2ML IJ SOLN: 4.0000 mg | Freq: Four times a day (QID) | INTRAMUSCULAR | Status: DC | PRN

## 2019-05-24 MED ORDER — HYDROCHLOROTHIAZIDE 12.5 MG PO CAPS: 12.5000 mg | ORAL_CAPSULE | Freq: Every day | ORAL | Status: DC

## 2019-05-24 MED ORDER — BUPIVACAINE HCL (PF) 0.5 % IJ SOLN
INTRAMUSCULAR | Status: AC
  Filled 2019-05-24: qty 30

## 2019-05-24 MED ORDER — MIDAZOLAM HCL 2 MG/2ML IJ SOLN
INTRAMUSCULAR | Status: DC | PRN
  Administered 2019-05-24: 2 mg via INTRAVENOUS

## 2019-05-24 MED ORDER — HYDROMORPHONE HCL 1 MG/ML IJ SOLN
0.5000 mg | INTRAMUSCULAR | Status: DC | PRN
  Administered 2019-05-24 (×2): 0.5 mg via INTRAVENOUS

## 2019-05-24 MED ORDER — SODIUM CHLORIDE 0.9 % IV SOLN
INTRAVENOUS | Status: DC | PRN
  Administered 2019-05-24: 500 mL

## 2019-05-24 MED ORDER — THROMBIN 5000 UNITS EX SOLR
OROMUCOSAL | Status: DC | PRN
  Administered 2019-05-24: 5 mL via TOPICAL

## 2019-05-24 MED ORDER — PHENYLEPHRINE HCL (PRESSORS) 10 MG/ML IV SOLN
INTRAVENOUS | Status: AC
  Filled 2019-05-24: qty 1

## 2019-05-24 MED ORDER — SODIUM CHLORIDE 0.9% FLUSH: 3.0000 mL | Freq: Two times a day (BID) | INTRAVENOUS | Status: DC

## 2019-05-24 MED ORDER — FLEET ENEMA 7-19 GM/118ML RE ENEM: 1.0000 | ENEMA | Freq: Once | RECTAL | Status: DC | PRN

## 2019-05-24 MED ORDER — FENTANYL CITRATE (PF) 250 MCG/5ML IJ SOLN
INTRAMUSCULAR | Status: AC
  Filled 2019-05-24: qty 5

## 2019-05-24 MED ORDER — PHENYLEPHRINE 40 MCG/ML (10ML) SYRINGE FOR IV PUSH (FOR BLOOD PRESSURE SUPPORT)
PREFILLED_SYRINGE | INTRAVENOUS | Status: DC | PRN
  Administered 2019-05-24: 80 ug via INTRAVENOUS
  Administered 2019-05-24: 120 ug via INTRAVENOUS
  Administered 2019-05-24: 80 ug via INTRAVENOUS
  Administered 2019-05-24: 120 ug via INTRAVENOUS

## 2019-05-24 MED ORDER — ONDANSETRON HCL 4 MG PO TABS: 4.0000 mg | ORAL_TABLET | Freq: Four times a day (QID) | ORAL | Status: DC | PRN

## 2019-05-24 MED ORDER — GABAPENTIN 300 MG PO CAPS: 300.0000 mg | ORAL_CAPSULE | Freq: Every day | ORAL | Status: DC | PRN

## 2019-05-24 SURGICAL SUPPLY — 64 items
BAG DECANTER FOR FLEXI CONT (MISCELLANEOUS) ×2 IMPLANT
BAND RUBBER #18 3X1/16 STRL (MISCELLANEOUS) IMPLANT
BENZOIN TINCTURE PRP APPL 2/3 (GAUZE/BANDAGES/DRESSINGS) IMPLANT
BLADE CLIPPER SURG (BLADE) IMPLANT
BLADE SURG 11 STRL SS (BLADE) ×2 IMPLANT
BUR MATCHSTICK NEURO 3.0 LAGG (BURR) ×2 IMPLANT
BUR PRECISION FLUTE 5.0 (BURR) ×4 IMPLANT
CANISTER SUCT 3000ML PPV (MISCELLANEOUS) ×2 IMPLANT
CARTRIDGE OIL MAESTRO DRILL (MISCELLANEOUS) ×1 IMPLANT
COVER WAND RF STERILE (DRAPES) ×2 IMPLANT
DECANTER SPIKE VIAL GLASS SM (MISCELLANEOUS) ×2 IMPLANT
DERMABOND ADVANCED (GAUZE/BANDAGES/DRESSINGS) ×1
DERMABOND ADVANCED .7 DNX12 (GAUZE/BANDAGES/DRESSINGS) ×1 IMPLANT
DIFFUSER DRILL AIR PNEUMATIC (MISCELLANEOUS) ×2 IMPLANT
DRAPE C-ARM 42X72 X-RAY (DRAPES) ×4 IMPLANT
DRAPE LAPAROTOMY 100X72X124 (DRAPES) ×2 IMPLANT
DRAPE MICROSCOPE LEICA (MISCELLANEOUS) IMPLANT
DRAPE SURG 17X23 STRL (DRAPES) ×2 IMPLANT
DRSG OPSITE POSTOP 3X4 (GAUZE/BANDAGES/DRESSINGS) IMPLANT
DRSG OPSITE POSTOP 4X8 (GAUZE/BANDAGES/DRESSINGS) ×2 IMPLANT
DURAPREP 26ML APPLICATOR (WOUND CARE) ×2 IMPLANT
ELECT BLADE 4.0 EZ CLEAN MEGAD (MISCELLANEOUS) ×2
ELECT REM PT RETURN 9FT ADLT (ELECTROSURGICAL) ×2
ELECTRODE BLDE 4.0 EZ CLN MEGD (MISCELLANEOUS) ×1 IMPLANT
ELECTRODE REM PT RTRN 9FT ADLT (ELECTROSURGICAL) ×1 IMPLANT
GAUZE 4X4 16PLY RFD (DISPOSABLE) IMPLANT
GAUZE SPONGE 4X4 12PLY STRL (GAUZE/BANDAGES/DRESSINGS) IMPLANT
GLOVE BIO SURGEON STRL SZ 6.5 (GLOVE) ×6 IMPLANT
GLOVE BIO SURGEON STRL SZ7.5 (GLOVE) ×2 IMPLANT
GLOVE BIOGEL PI IND STRL 6.5 (GLOVE) ×2 IMPLANT
GLOVE BIOGEL PI IND STRL 7.5 (GLOVE) ×2 IMPLANT
GLOVE BIOGEL PI INDICATOR 6.5 (GLOVE) ×2
GLOVE BIOGEL PI INDICATOR 7.5 (GLOVE) ×2
GLOVE ECLIPSE 7.0 STRL STRAW (GLOVE) ×2 IMPLANT
GLOVE EXAM NITRILE XL STR (GLOVE) IMPLANT
GOWN STRL REUS W/ TWL LRG LVL3 (GOWN DISPOSABLE) ×4 IMPLANT
GOWN STRL REUS W/ TWL XL LVL3 (GOWN DISPOSABLE) IMPLANT
GOWN STRL REUS W/TWL 2XL LVL3 (GOWN DISPOSABLE) IMPLANT
GOWN STRL REUS W/TWL LRG LVL3 (GOWN DISPOSABLE) ×4
GOWN STRL REUS W/TWL XL LVL3 (GOWN DISPOSABLE)
HEMOSTAT POWDER KIT SURGIFOAM (HEMOSTASIS) ×2 IMPLANT
KIT BASIN OR (CUSTOM PROCEDURE TRAY) ×2 IMPLANT
KIT TURNOVER KIT B (KITS) ×2 IMPLANT
NEEDLE HYPO 18GX1.5 BLUNT FILL (NEEDLE) IMPLANT
NEEDLE HYPO 25X1 1.5 SAFETY (NEEDLE) ×2 IMPLANT
NEEDLE SPNL 18GX3.5 QUINCKE PK (NEEDLE) IMPLANT
NS IRRIG 1000ML POUR BTL (IV SOLUTION) ×2 IMPLANT
OIL CARTRIDGE MAESTRO DRILL (MISCELLANEOUS) ×2
PACK LAMINECTOMY NEURO (CUSTOM PROCEDURE TRAY) ×2 IMPLANT
PAD ARMBOARD 7.5X6 YLW CONV (MISCELLANEOUS) ×6 IMPLANT
SPONGE LAP 4X18 RFD (DISPOSABLE) IMPLANT
SPONGE SURGIFOAM ABS GEL SZ50 (HEMOSTASIS) IMPLANT
STAPLER VISISTAT 35W (STAPLE) ×2 IMPLANT
STRIP CLOSURE SKIN 1/2X4 (GAUZE/BANDAGES/DRESSINGS) IMPLANT
SUT VIC AB 0 CT1 18XCR BRD8 (SUTURE) ×2 IMPLANT
SUT VIC AB 0 CT1 8-18 (SUTURE) ×2
SUT VIC AB 1 CT1 18XBRD ANBCTR (SUTURE) ×1 IMPLANT
SUT VIC AB 1 CT1 8-18 (SUTURE) ×1
SUT VIC AB 2-0 CT1 18 (SUTURE) IMPLANT
SUT VICRYL 3-0 RB1 18 ABS (SUTURE) ×4 IMPLANT
SYR 3ML LL SCALE MARK (SYRINGE) IMPLANT
TOWEL GREEN STERILE (TOWEL DISPOSABLE) ×2 IMPLANT
TOWEL GREEN STERILE FF (TOWEL DISPOSABLE) ×2 IMPLANT
WATER STERILE IRR 1000ML POUR (IV SOLUTION) ×2 IMPLANT

## 2019-05-24 NOTE — Anesthesia Procedure Notes (Signed)
Procedure Name: Intubation Date/Time: 05/24/2019 8:08 AM Performed by: Verdie Drown, CRNA Pre-anesthesia Checklist: Patient identified, Emergency Drugs available, Suction available and Patient being monitored Patient Re-evaluated:Patient Re-evaluated prior to induction Oxygen Delivery Method: Circle System Utilized Preoxygenation: Pre-oxygenation with 100% oxygen (x 5 minutes) Induction Type: IV induction Laryngoscope Size: Mac and 4 Grade View: Grade I Tube type: Oral Tube size: 7.5 mm Number of attempts: 1 Airway Equipment and Method: Oral airway,  Video-laryngoscopy,  Rigid stylet and Patient positioned with wedge pillow Placement Confirmation: ETT inserted through vocal cords under direct vision,  positive ETCO2 and breath sounds checked- equal and bilateral Secured at: 24 cm Tube secured with: Tape Dental Injury: Teeth and Oropharynx as per pre-operative assessment  Difficulty Due To: Difficult Airway- due to large tongue, Difficult Airway- due to reduced neck mobility and Difficulty was anticipated Future Recommendations: Recommend- induction with short-acting agent, and alternative techniques readily available

## 2019-05-24 NOTE — Discharge Summary (Addendum)
Physician Discharge Summary  Patient ID: Sean Murillo MRN: [id number] DOB/AGE: 41-Apr-1977 41 y.o.  Admit date: 05/24/2019 Discharge date: 05/24/2019  Admission Diagnoses:  Painful ortho hardware  Discharge Diagnoses:  Same Active Problems:   Painful orthopaedic hardware [facility])   Discharged Condition: Stable  Hospital Course:  Aareon Drass is a 43 y.o. male who was admitted for the below same-day procedure. There were no post operative complications. At time of discharge, pain was well controlled, ambulating with Pt/OT, tolerating po, voiding normal. Ready for discharge  Treatments: Surgery - removal lumbar hardware  Discharge Exam: Blood pressure 109/67, pulse 100, temperature 98.6 F (37 C), temperature source Oral, resp. rate 18, height 5\' 9"  (1.753 m), weight (!) 186.4 kg, SpO2 94 %. Awake, alert, oriented Speech fluent, appropriate CN grossly intact 5/5 BUE/BLE Wound c/d/i  Disposition: Discharge disposition: 01-Home or Self Care       Discharge Instructions    Call MD for:  difficulty breathing, headache or visual disturbances   Complete by: As directed    Call MD for:  difficulty breathing, headache or visual disturbances   Complete by: As directed    Call MD for:  persistant dizziness or light-headedness   Complete by: As directed    Call MD for:  persistant dizziness or light-headedness   Complete by: As directed    Call MD for:  redness, tenderness, or signs of infection (pain, swelling, redness, odor or green/yellow discharge around incision site)   Complete by: As directed    Call MD for:  redness, tenderness, or signs of infection (pain, swelling, redness, odor or green/yellow discharge around incision site)   Complete by: As directed    Call MD for:  severe uncontrolled pain   Complete by: As directed    Call MD for:  severe uncontrolled pain   Complete by: As directed    Call MD for:  temperature >100.4   Complete by: As directed    Call  MD for:  temperature >100.4   Complete by: As directed    Diet general   Complete by: As directed    Diet general   Complete by: As directed    Driving Restrictions   Complete by: As directed    Do not drive until given clearance.   Driving Restrictions   Complete by: As directed    Do not drive until given clearance.   Increase activity slowly   Complete by: As directed    Increase activity slowly   Complete by: As directed    Lifting restrictions   Complete by: As directed    Do not lift anything >10lbs. Avoid bending and twisting in awkward positions. Avoid bending at the back.   Lifting restrictions   Complete by: As directed    Do not lift anything >10lbs. Avoid bending and twisting in awkward positions. Avoid bending at the back.   May shower / Bathe   Complete by: As directed    In 24 hours. Okay to wash wound with warm soapy water. Avoid scrubbing the wound. Pat dry.   May shower / Bathe   Complete by: As directed    In 24 hours. Okay to wash wound with warm soapy water. Avoid scrubbing the wound. Pat dry.   Remove dressing in 24 hours   Complete by: As directed    Remove dressing in 24 hours   Complete by: As directed      Allergies as of 05/24/2019      Reactions  Contrast Media [iodinated Diagnostic Agents] Hives   Iohexol   Shellfish Allergy Hives      Medication List    STOP taking these medications   HYDROcodone-acetaminophen 5-325 MG tablet Commonly known as: NORCO/VICODIN     TAKE these medications   gabapentin 300 MG capsule Commonly known as: NEURONTIN Take 300 mg by mouth daily as needed (pain.).   lisinopril-hydrochlorothiazide 20-12.5 MG tablet Commonly known as: ZESTORETIC Take 1 tablet by mouth daily.   sertraline 100 MG tablet Commonly known as: ZOLOFT Take 200 mg by mouth daily.   traZODone 100 MG tablet Commonly known as: DESYREL Take 100 mg by mouth at bedtime as needed for sleep.      Follow-up Information    Lisbeth Renshaw, MD. Schedule an appointment as soon as possible for a visit in 3 week(s).   Specialty: Neurosurgery Contact information: [address] [place] [address] [phone]           Signed: Alyson Ingles 05/24/2019, 1:18 PM

## 2019-05-24 NOTE — Anesthesia Postprocedure Evaluation (Signed)
Anesthesia Post Note  Patient: Sean Murillo  Procedure(s) Performed: HARDWARE REMOVAL, LUMBAR FIVE- SACRAL ONE (N/A Spine Lumbar)     Patient location during evaluation: PACU Anesthesia Type: General Level of consciousness: awake and alert Pain management: pain level controlled Vital Signs Assessment: post-procedure vital signs reviewed and stable Respiratory status: spontaneous breathing, nonlabored ventilation, respiratory function stable and patient connected to nasal cannula oxygen Cardiovascular status: blood pressure returned to baseline and stable Postop Assessment: no apparent nausea or vomiting Anesthetic complications: no    Last Vitals:  Vitals:   05/24/19 1056 05/24/19 1241  BP: 106/73 109/67  Pulse: (!) 105 100  Resp: 19 18  Temp: 36.7 C 37 C  SpO2:  94%    Last Pain:  Vitals:   05/24/19 1421  TempSrc:   PainSc: 4                  Stepfanie Yott S

## 2019-05-24 NOTE — Op Note (Signed)
  NEUROSURGERY OPERATIVE NOTE   PREOP DIAGNOSIS:  1. Chronic low back pain 2. Previous L5-S1 Fusion   POSTOP DIAGNOSIS: Same  PROCEDURE: 1. Exploration of fusion, removal of previous hardware  SURGEON: Dr. Consuella Lose, MD  ASSISTANT: Ferne Reus, PA-C  ANESTHESIA: General Endotracheal  EBL: 200cc  SPECIMENS: None  DRAINS: None  COMPLICATIONS: None immediate  CONDITION: Hemodynamically stable to PACU  HISTORY: Sean Murillo is a 43 y.o. male who previously underwent L5-S1 Gill procedure for low back and leg pain more than 2 years ago.  He did subsequently undergo revision of an L5 screw.  He had good improvement in his back and leg pain for a period of time however has progressively developed worsening low back and left-sided hip pain.  He has been managed conservatively without significant improvement.  There was concern that the implanted hardware is a source of his pain.  He therefore presents today for hardware removal.  The risks and benefits of the surgery were reviewed in detail with the patient and his wife.  After all questions were answered informed consent was obtained and witnessed.  PROCEDURE IN DETAIL: The patient was brought to the operating room. After induction of general anesthesia, the patient was positioned on the operative table in the prone position. All pressure points were meticulously padded.  Previous skin incision was then marked out and prepped and draped in the usual sterile fashion.  After timeout was conducted, the previous incision was infiltrated with local anesthetic with epinephrine.  The incision was then opened sharply, and Bovie electrocautery was used to dissect through subcutaneous tissue until the lumbodorsal fascia was identified and incised.  The spinous process at L4 as well as the top of the sacrum were identified.  Dissection was then carried out laterally to identify the screws.  I did note significant amount of  posterolateral fusion mass surrounding primarily the S1 screws bilaterally, more on the right.  Using a combination of rongeurs, the overlying bone was removed to expose the set screws.  The set screws were then removed.  Further bone was removed in order to remove the lordotic rod bilaterally.  I was then able to use a rongeur to firmly grasp the screw heads.  There is absolutely no motion noted at the L5-S1 segment with posteriorly directed traction on the L5 and S1 screws indicating solid fusion at this level.  At this point, the final tightening device was placed over the tulips in order to loosen them.  Screwdriver was then placed and the screws at L5 and S1 were removed.  Hemostasis was then secured using a combination of bipolar electrocautery and morselized Gelfoam and thrombin.  Self-retaining retractors were then removed.  The wound was then closed in multiple layers using a combination of 1 and 0 Vicryl stitches.  Skin was closed with staples.  At the end of the case all sponge, needle, and instrument counts were correct. The patient was then transferred to the stretcher, extubated, and taken to the post-anesthesia care unit in stable hemodynamic condition.

## 2019-05-24 NOTE — Plan of Care (Signed)
Patient alert and oriented, mae's well, voiding adequate amount of urine, swallowing without difficulty, no c/o pain at time of discharge. Patient discharged home with family. Script and discharged instructions given to patient. Patient and family stated understanding of instructions given. Patient has an appointment with Dr. Nundkumar  

## 2019-05-24 NOTE — Progress Notes (Signed)
Patient positive for staphylococcus in nares. Per policy, Profend/ Betadine to be given DOS since surgery < 5 days from PAT appointment. However, patient has allergy to shellfish-- Dr. Kathyrn Sheriff notified. Per Dr. Kathyrn Sheriff, give Profend/ Betadine.

## 2019-05-24 NOTE — Transfer of Care (Signed)
Immediate Anesthesia Transfer of Care Note  Patient: Sean Murillo  Procedure(s) Performed: HARDWARE REMOVAL, LUMBAR FIVE- SACRAL ONE (N/A Spine Lumbar)  Patient Location: PACU  Anesthesia Type:General  Level of Consciousness: awake, alert , oriented, patient cooperative and responds to stimulation  Airway & Oxygen Therapy: Patient Spontanous Breathing and Patient connected to face mask oxygen  Post-op Assessment: Report given to RN, Post -op Vital signs reviewed and stable and Patient moving all extremities X 4  Post vital signs: Reviewed and stable  Last Vitals:  Vitals Value Taken Time  BP    Temp 36.8 C 05/24/19 1003  Pulse 104 05/24/19 1005  Resp 23 05/24/19 1005  SpO2 99 % 05/24/19 1005  Vitals shown include unvalidated device data.  Last Pain:  Vitals:   05/24/19 0652  TempSrc:   PainSc: 7       Patients Stated Pain Goal: 3 (34/03/70 9643)  Complications: No apparent anesthesia complications

## 2019-05-25 ENCOUNTER — Emergency Department (HOSPITAL_COMMUNITY)
Admission: EM | Admit: 2019-05-25 | Discharge: 2019-05-26 | Disposition: A | Discharge status: Home / Self Care | Payer: 59 | Attending: Emergency Medicine | Admitting: Emergency Medicine

## 2019-05-25 ENCOUNTER — Encounter (HOSPITAL_COMMUNITY): Payer: Self-pay | Admitting: Neurosurgery

## 2019-05-25 ENCOUNTER — Other Ambulatory Visit: Payer: Self-pay

## 2019-05-25 DIAGNOSIS — G8918 Other acute postprocedural pain: Secondary | ICD-10-CM | POA: Diagnosis present

## 2019-05-25 DIAGNOSIS — I10 Essential (primary) hypertension: Secondary | ICD-10-CM | POA: Insufficient documentation

## 2019-05-25 DIAGNOSIS — Z79899 Other long term (current) drug therapy: Secondary | ICD-10-CM | POA: Insufficient documentation

## 2019-05-25 DIAGNOSIS — J45909 Unspecified asthma, uncomplicated: Secondary | ICD-10-CM | POA: Insufficient documentation

## 2019-05-25 MED ORDER — OXYCODONE-ACETAMINOPHEN 5-325 MG PO TABS
1.0000 | ORAL_TABLET | ORAL | Status: DC | PRN
  Administered 2019-05-25: 21:00:00 1 via ORAL
  Filled 2019-05-25: qty 1

## 2019-05-25 NOTE — ED Triage Notes (Signed)
Patient reports post operative pain at lower back ( lumbar surgery yesterday) this morning unrelieved by prescription Robaxin . No fever or chills .

## 2019-05-26 MED ORDER — OXYCODONE HCL 5 MG PO TABS: 5.0000 mg | ORAL_TABLET | ORAL | 0 refills | Status: DC | PRN

## 2019-05-26 MED ORDER — HYDROMORPHONE HCL 1 MG/ML IJ SOLN
1.0000 mg | Freq: Once | INTRAMUSCULAR | Status: AC
  Administered 2019-05-26: 04:00:00 1 mg via INTRAVENOUS
  Filled 2019-05-26: qty 1

## 2019-05-26 MED ORDER — HYDROMORPHONE HCL 1 MG/ML IJ SOLN
1.0000 mg | Freq: Once | INTRAMUSCULAR | Status: AC
  Administered 2019-05-26: 1 mg via INTRAVENOUS
  Filled 2019-05-26: qty 1
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
## 2019-07-07 ENCOUNTER — Encounter (HOSPITAL_COMMUNITY): Payer: Self-pay

## 2019-07-07 ENCOUNTER — Ambulatory Visit (HOSPITAL_COMMUNITY): Payer: 59 | Admitting: Physical Therapy

## 2019-12-22 ENCOUNTER — Other Ambulatory Visit: Payer: Self-pay

## 2019-12-22 ENCOUNTER — Emergency Department (HOSPITAL_COMMUNITY)
Admission: EM | Admit: 2019-12-22 | Discharge: 2019-12-23 | Disposition: A | Discharge status: Home / Self Care | Payer: No Typology Code available for payment source | Attending: Emergency Medicine | Admitting: Emergency Medicine

## 2019-12-22 ENCOUNTER — Encounter (HOSPITAL_COMMUNITY): Payer: Self-pay

## 2019-12-22 DIAGNOSIS — Z79899 Other long term (current) drug therapy: Secondary | ICD-10-CM | POA: Insufficient documentation

## 2019-12-22 DIAGNOSIS — Z20822 Contact with and (suspected) exposure to covid-19: Secondary | ICD-10-CM | POA: Diagnosis not present

## 2019-12-22 DIAGNOSIS — I10 Essential (primary) hypertension: Secondary | ICD-10-CM | POA: Insufficient documentation

## 2019-12-22 DIAGNOSIS — K4031 Unilateral inguinal hernia, with obstruction, without gangrene, recurrent: Secondary | ICD-10-CM | POA: Diagnosis not present

## 2019-12-22 DIAGNOSIS — K469 Unspecified abdominal hernia without obstruction or gangrene: Secondary | ICD-10-CM

## 2019-12-22 DIAGNOSIS — R1031 Right lower quadrant pain: Secondary | ICD-10-CM | POA: Diagnosis present

## 2019-12-22 NOTE — ED Triage Notes (Signed)
Pt arrives to ED w/ c/o increased hernia pain that started at 1400 today. Pt reports 10/10 pain.

## 2019-12-23 ENCOUNTER — Encounter (HOSPITAL_COMMUNITY): Payer: Self-pay | Admitting: General Surgery

## 2019-12-23 ENCOUNTER — Emergency Department (HOSPITAL_COMMUNITY): Payer: No Typology Code available for payment source

## 2019-12-23 LAB — CBC WITH DIFFERENTIAL/PLATELET
Abs Immature Granulocytes: 0.06 10*3/uL (ref 0.00–0.07)
Basophils Absolute: 0.2 10*3/uL — ABNORMAL HIGH (ref 0.0–0.1)
Basophils Relative: 1 %
Eosinophils Absolute: 0.5 10*3/uL (ref 0.0–0.5)
Eosinophils Relative: 3 %
HCT: 42.9 % (ref 39.0–52.0)
Hemoglobin: 14.5 g/dL (ref 13.0–17.0)
Immature Granulocytes: 0 %
Lymphocytes Relative: 32 %
Lymphs Abs: 5.1 10*3/uL — ABNORMAL HIGH (ref 0.7–4.0)
MCH: 27.4 pg (ref 26.0–34.0)
MCHC: 33.8 g/dL (ref 30.0–36.0)
MCV: 80.9 fL (ref 80.0–100.0)
Monocytes Absolute: 1.5 10*3/uL — ABNORMAL HIGH (ref 0.1–1.0)
Monocytes Relative: 9 %
Neutro Abs: 8.6 10*3/uL — ABNORMAL HIGH (ref 1.7–7.7)
Neutrophils Relative %: 55 %
Platelets: 424 10*3/uL — ABNORMAL HIGH (ref 150–400)
RBC: 5.3 MIL/uL (ref 4.22–5.81)
RDW: 15.4 % (ref 11.5–15.5)
WBC: 15.8 10*3/uL — ABNORMAL HIGH (ref 4.0–10.5)
nRBC: 0 % (ref 0.0–0.2)

## 2019-12-23 LAB — COMPREHENSIVE METABOLIC PANEL
ALT: 58 U/L — ABNORMAL HIGH (ref 0–44)
AST: 61 U/L — ABNORMAL HIGH (ref 15–41)
Albumin: 3.3 g/dL — ABNORMAL LOW (ref 3.5–5.0)
Alkaline Phosphatase: 61 U/L (ref 38–126)
Anion gap: 15 (ref 5–15)
BUN: 8 mg/dL (ref 6–20)
CO2: 24 mmol/L (ref 22–32)
Calcium: 8.9 mg/dL (ref 8.9–10.3)
Chloride: 99 mmol/L (ref 98–111)
Creatinine, Ser: 0.78 mg/dL (ref 0.61–1.24)
GFR calc Af Amer: >60 mL/min (ref >60)
GFR calc non Af Amer: >60 mL/min (ref >60)
Glucose, Bld: 107 mg/dL — ABNORMAL HIGH (ref 70–99)
Potassium: 3.7 mmol/L (ref 3.5–5.1)
Sodium: 138 mmol/L (ref 135–145)
Total Bilirubin: 1.2 mg/dL (ref 0.3–1.2)
Total Protein: 7 g/dL (ref 6.5–8.1)

## 2019-12-23 LAB — SARS CORONAVIRUS 2 BY RT PCR (HOSPITAL ORDER, PERFORMED IN ~~LOC~~ HOSPITAL LAB): SARS Coronavirus 2: NEGATIVE

## 2019-12-23 MED ORDER — HYDROMORPHONE HCL 1 MG/ML IJ SOLN
1.0000 mg | Freq: Once | INTRAMUSCULAR | Status: AC
  Administered 2019-12-23: 1 mg via INTRAVENOUS
  Filled 2019-12-23: qty 1

## 2019-12-23 MED ORDER — HYDROCODONE-ACETAMINOPHEN 5-325 MG PO TABS
1.0000 | ORAL_TABLET | Freq: Once | ORAL | Status: AC
  Administered 2019-12-23: 1 via ORAL
  Filled 2019-12-23: qty 1

## 2019-12-23 MED ORDER — HYDROCODONE-ACETAMINOPHEN 5-325 MG PO TABS: 1.0000 | ORAL_TABLET | ORAL | 0 refills | Status: DC | PRN

## 2019-12-23 NOTE — Discharge Instructions (Addendum)
Your CT scan today did not show that the hernia was stuck or causing any bowel blockage and the surgery team who evaluated you today felt that you do not need surgery emergently Please follow up with Dr. Marylene Land in [place] Return to the ER if you have worsening symptoms
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
## 2019-12-23 NOTE — ED Notes (Signed)
Patient verbalizes understanding of discharge instructions. Opportunity for questioning and answers were provided. Armband removed by staff, pt discharged from ED to home 

## 2019-12-23 NOTE — ED Notes (Signed)
General Surgery at bedside.

## 2019-12-23 NOTE — ED Notes (Signed)
Pt requests medication for pain prior to discharge, EDP contacted, new orders provided (see MAR)
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
## 2020-01-01 ENCOUNTER — Emergency Department (HOSPITAL_COMMUNITY): Payer: No Typology Code available for payment source

## 2020-01-01 ENCOUNTER — Emergency Department (HOSPITAL_COMMUNITY)
Admission: EM | Admit: 2020-01-01 | Discharge: 2020-01-02 | Payer: No Typology Code available for payment source | Attending: Emergency Medicine | Admitting: Emergency Medicine

## 2020-01-01 ENCOUNTER — Encounter (HOSPITAL_COMMUNITY): Payer: Self-pay | Admitting: *Deleted

## 2020-01-01 ENCOUNTER — Other Ambulatory Visit: Payer: Self-pay

## 2020-01-01 DIAGNOSIS — Z20822 Contact with and (suspected) exposure to covid-19: Secondary | ICD-10-CM | POA: Insufficient documentation

## 2020-01-01 DIAGNOSIS — I10 Essential (primary) hypertension: Secondary | ICD-10-CM | POA: Diagnosis not present

## 2020-01-01 DIAGNOSIS — J45909 Unspecified asthma, uncomplicated: Secondary | ICD-10-CM | POA: Diagnosis not present

## 2020-01-01 DIAGNOSIS — K436 Other and unspecified ventral hernia with obstruction, without gangrene: Secondary | ICD-10-CM | POA: Diagnosis not present

## 2020-01-01 DIAGNOSIS — Z6841 Body Mass Index (BMI) 40.0 and over, adult: Secondary | ICD-10-CM

## 2020-01-01 DIAGNOSIS — K6389 Other specified diseases of intestine: Secondary | ICD-10-CM | POA: Diagnosis not present

## 2020-01-01 DIAGNOSIS — F17290 Nicotine dependence, other tobacco product, uncomplicated: Secondary | ICD-10-CM | POA: Insufficient documentation

## 2020-01-01 DIAGNOSIS — R109 Unspecified abdominal pain: Secondary | ICD-10-CM | POA: Diagnosis present

## 2020-01-01 LAB — CBC WITH DIFFERENTIAL/PLATELET
Abs Immature Granulocytes: 0.05 10*3/uL (ref 0.00–0.07)
Basophils Absolute: 0.2 10*3/uL — ABNORMAL HIGH (ref 0.0–0.1)
Basophils Relative: 1 %
Eosinophils Absolute: 0.4 10*3/uL (ref 0.0–0.5)
Eosinophils Relative: 2 %
HCT: 43.2 % (ref 39.0–52.0)
Hemoglobin: 14.7 g/dL (ref 13.0–17.0)
Immature Granulocytes: 0 %
Lymphocytes Relative: 35 %
Lymphs Abs: 6.3 10*3/uL — ABNORMAL HIGH (ref 0.7–4.0)
MCH: 27.6 pg (ref 26.0–34.0)
MCHC: 34 g/dL (ref 30.0–36.0)
MCV: 81.1 fL (ref 80.0–100.0)
Monocytes Absolute: 1.7 10*3/uL — ABNORMAL HIGH (ref 0.1–1.0)
Monocytes Relative: 9 %
Neutro Abs: 9.6 10*3/uL — ABNORMAL HIGH (ref 1.7–7.7)
Neutrophils Relative %: 53 %
Platelets: 409 10*3/uL — ABNORMAL HIGH (ref 150–400)
RBC: 5.33 MIL/uL (ref 4.22–5.81)
RDW: 15.7 % — ABNORMAL HIGH (ref 11.5–15.5)
WBC: 18.2 10*3/uL — ABNORMAL HIGH (ref 4.0–10.5)
nRBC: 0 % (ref 0.0–0.2)

## 2020-01-01 LAB — COMPREHENSIVE METABOLIC PANEL
ALT: 64 U/L — ABNORMAL HIGH (ref 0–44)
AST: 56 U/L — ABNORMAL HIGH (ref 15–41)
Albumin: 3.5 g/dL (ref 3.5–5.0)
Alkaline Phosphatase: 60 U/L (ref 38–126)
Anion gap: 10 (ref 5–15)
BUN: 9 mg/dL (ref 6–20)
CO2: 26 mmol/L (ref 22–32)
Calcium: 8.8 mg/dL — ABNORMAL LOW (ref 8.9–10.3)
Chloride: 102 mmol/L (ref 98–111)
Creatinine, Ser: 0.66 mg/dL (ref 0.61–1.24)
GFR calc Af Amer: >60 mL/min (ref >60)
GFR calc non Af Amer: >60 mL/min (ref >60)
Glucose, Bld: 111 mg/dL — ABNORMAL HIGH (ref 70–99)
Potassium: 3.5 mmol/L (ref 3.5–5.1)
Sodium: 138 mmol/L (ref 135–145)
Total Bilirubin: 0.7 mg/dL (ref 0.3–1.2)
Total Protein: 7.6 g/dL (ref 6.5–8.1)

## 2020-01-01 LAB — LACTIC ACID, PLASMA
Lactic Acid, Venous: 1.1 mmol/L (ref 0.5–1.9)
Lactic Acid, Venous: 1.1 mmol/L (ref 0.5–1.9)

## 2020-01-01 LAB — LIPASE, BLOOD: Lipase: 21 U/L (ref 11–51)

## 2020-01-01 LAB — SARS CORONAVIRUS 2 BY RT PCR (HOSPITAL ORDER, PERFORMED IN ~~LOC~~ HOSPITAL LAB): SARS Coronavirus 2: NEGATIVE

## 2020-01-01 MED ORDER — HYDROMORPHONE HCL 1 MG/ML IJ SOLN
1.0000 mg | Freq: Once | INTRAMUSCULAR | Status: AC
  Administered 2020-01-01: 1 mg via INTRAVENOUS
  Filled 2020-01-01: qty 1

## 2020-01-01 MED ORDER — FENTANYL CITRATE (PF) 100 MCG/2ML IJ SOLN
100.0000 ug | Freq: Once | INTRAMUSCULAR | Status: AC
  Administered 2020-01-01: 100 ug via INTRAVENOUS
  Filled 2020-01-01: qty 2

## 2020-01-01 MED ORDER — FENTANYL CITRATE (PF) 100 MCG/2ML IJ SOLN
75.0000 ug | Freq: Once | INTRAMUSCULAR | Status: AC
  Administered 2020-01-01: 75 ug via INTRAVENOUS
  Filled 2020-01-01: qty 2
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
## 2020-01-01 NOTE — Progress Notes (Signed)
[facility]  Severe morbid obese patient with large ventral hernia that is 17X18 cm fascial defect and large component of small intestine within the hernia. Agree some dilated small bowel and possible small segment with question of pneumatosis and thickening on the sagital view but no fluid or free air.  Patient's hernia defect is extremely large and he has been evaluated for complex abdominal wall reconstruction in [place] but has been instructed to lose weight.   Given the pneumatosis and obstruction he will likely need to be explored but with his fascial defect, I have no equipment such as a Abthera to temporize or vicryl mesh to even attempt exploration and closure at my facility, and we are very limited on call staff to care for and move this severe morbid obese patient.  Recommend transfer to another facility because we do not have the above equipment to explore and attempt a closure / temporary closure.   Discussed with Dr. Renaye Rakers.   Sean Greenhouse, MD [facility] [address] [place], [place] [address] [phone] (office)
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
## 2020-01-01 NOTE — ED Triage Notes (Signed)
Patient comes to the ED with hernia pain beginning about 4 hours ago today.
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
## 2020-01-02 DIAGNOSIS — K436 Other and unspecified ventral hernia with obstruction, without gangrene: Secondary | ICD-10-CM | POA: Diagnosis not present

## 2020-01-02 DIAGNOSIS — J45909 Unspecified asthma, uncomplicated: Secondary | ICD-10-CM | POA: Diagnosis not present

## 2020-01-02 DIAGNOSIS — F17290 Nicotine dependence, other tobacco product, uncomplicated: Secondary | ICD-10-CM | POA: Diagnosis not present

## 2020-01-02 DIAGNOSIS — R109 Unspecified abdominal pain: Secondary | ICD-10-CM | POA: Diagnosis present

## 2020-01-02 DIAGNOSIS — Z20822 Contact with and (suspected) exposure to covid-19: Secondary | ICD-10-CM | POA: Diagnosis not present

## 2020-01-02 DIAGNOSIS — I10 Essential (primary) hypertension: Secondary | ICD-10-CM | POA: Diagnosis not present

## 2020-01-02 MED ORDER — FENTANYL CITRATE (PF) 100 MCG/2ML IJ SOLN
100.0000 ug | INTRAMUSCULAR | Status: DC | PRN
  Administered 2020-01-02: 100 ug via INTRAVENOUS
  Filled 2020-01-02: qty 2

## 2020-01-02 MED ORDER — ONDANSETRON HCL 4 MG/2ML IJ SOLN
4.0000 mg | Freq: Once | INTRAMUSCULAR | Status: AC
  Administered 2020-01-02: 4 mg via INTRAVENOUS
  Filled 2020-01-02: qty 2

## 2020-01-02 NOTE — ED Provider Notes (Signed)
The patient appears reasonably stabilized for transfer considering the current resources, flow, and capabilities available in the ED at this time, and I doubt any other [facility] requiring further screening and/or treatment in the ED prior to transfer.  BP (!) 119/101   Pulse 94   Temp 97.7 F (36.5 C) (Oral)   Resp 20   Ht 1.753 m (5\' 9" )   Wt (!) 186.4 kg   SpO2 92%   BMI 60.69 kg/m     , MD 01/02/20 0157

## 2020-01-02 NOTE — ED Notes (Signed)
Xray notified of need of disk,

## 2020-01-07 ENCOUNTER — Telehealth (HOSPITAL_COMMUNITY): Payer: Self-pay

## 2020-01-07 ENCOUNTER — Emergency Department (HOSPITAL_COMMUNITY): Payer: No Typology Code available for payment source

## 2020-01-07 ENCOUNTER — Emergency Department (HOSPITAL_COMMUNITY)
Admission: EM | Admit: 2020-01-07 | Discharge: 2020-01-08 | Disposition: A | Discharge status: Other Acute Inpatient Hospital | Payer: No Typology Code available for payment source | Attending: Emergency Medicine | Admitting: Emergency Medicine

## 2020-01-07 ENCOUNTER — Encounter (HOSPITAL_COMMUNITY): Payer: Self-pay | Admitting: *Deleted

## 2020-01-07 ENCOUNTER — Other Ambulatory Visit: Payer: Self-pay

## 2020-01-07 DIAGNOSIS — K56699 Other intestinal obstruction unspecified as to partial versus complete obstruction: Secondary | ICD-10-CM | POA: Insufficient documentation

## 2020-01-07 DIAGNOSIS — R112 Nausea with vomiting, unspecified: Secondary | ICD-10-CM | POA: Diagnosis not present

## 2020-01-07 DIAGNOSIS — Z79899 Other long term (current) drug therapy: Secondary | ICD-10-CM | POA: Insufficient documentation

## 2020-01-07 DIAGNOSIS — J45909 Unspecified asthma, uncomplicated: Secondary | ICD-10-CM | POA: Insufficient documentation

## 2020-01-07 DIAGNOSIS — I10 Essential (primary) hypertension: Secondary | ICD-10-CM | POA: Diagnosis not present

## 2020-01-07 DIAGNOSIS — Z20822 Contact with and (suspected) exposure to covid-19: Secondary | ICD-10-CM | POA: Insufficient documentation

## 2020-01-07 DIAGNOSIS — K56609 Unspecified intestinal obstruction, unspecified as to partial versus complete obstruction: Secondary | ICD-10-CM

## 2020-01-07 LAB — COMPREHENSIVE METABOLIC PANEL
ALT: 19 U/L (ref 0–44)
AST: 18 U/L (ref 15–41)
Albumin: 2.6 g/dL — ABNORMAL LOW (ref 3.5–5.0)
Alkaline Phosphatase: 47 U/L (ref 38–126)
Anion gap: 9 (ref 5–15)
BUN: 8 mg/dL (ref 6–20)
CO2: 29 mmol/L (ref 22–32)
Calcium: 8.6 mg/dL — ABNORMAL LOW (ref 8.9–10.3)
Chloride: 103 mmol/L (ref 98–111)
Creatinine, Ser: 0.74 mg/dL (ref 0.61–1.24)
GFR calc Af Amer: >60 mL/min (ref >60)
GFR calc non Af Amer: >60 mL/min (ref >60)
Glucose, Bld: 118 mg/dL — ABNORMAL HIGH (ref 70–99)
Potassium: 3.5 mmol/L (ref 3.5–5.1)
Sodium: 141 mmol/L (ref 135–145)
Total Bilirubin: 0.7 mg/dL (ref 0.3–1.2)
Total Protein: 6.7 g/dL (ref 6.5–8.1)

## 2020-01-07 LAB — URINALYSIS, ROUTINE W REFLEX MICROSCOPIC
Bacteria, UA: NONE SEEN
Bilirubin Urine: NEGATIVE
Glucose, UA: NEGATIVE mg/dL
Ketones, ur: NEGATIVE mg/dL
Leukocytes,Ua: NEGATIVE
Nitrite: NEGATIVE
Protein, ur: NEGATIVE mg/dL
Specific Gravity, Urine: 1.015 (ref 1.005–1.030)
pH: 6 (ref 5.0–8.0)

## 2020-01-07 LAB — CBC WITH DIFFERENTIAL/PLATELET
Abs Immature Granulocytes: 0.29 10*3/uL — ABNORMAL HIGH (ref 0.00–0.07)
Basophils Absolute: 0.1 10*3/uL (ref 0.0–0.1)
Basophils Relative: 1 %
Eosinophils Absolute: 0.1 10*3/uL (ref 0.0–0.5)
Eosinophils Relative: 1 %
HCT: 36.4 % — ABNORMAL LOW (ref 39.0–52.0)
Hemoglobin: 12.2 g/dL — ABNORMAL LOW (ref 13.0–17.0)
Immature Granulocytes: 2 %
Lymphocytes Relative: 21 %
Lymphs Abs: 3.1 10*3/uL (ref 0.7–4.0)
MCH: 27.2 pg (ref 26.0–34.0)
MCHC: 33.5 g/dL (ref 30.0–36.0)
MCV: 81.3 fL (ref 80.0–100.0)
Monocytes Absolute: 1.3 10*3/uL — ABNORMAL HIGH (ref 0.1–1.0)
Monocytes Relative: 9 %
Neutro Abs: 10.1 10*3/uL — ABNORMAL HIGH (ref 1.7–7.7)
Neutrophils Relative %: 66 %
Platelets: 462 10*3/uL — ABNORMAL HIGH (ref 150–400)
RBC: 4.48 MIL/uL (ref 4.22–5.81)
RDW: 15.6 % — ABNORMAL HIGH (ref 11.5–15.5)
WBC: 15 10*3/uL — ABNORMAL HIGH (ref 4.0–10.5)
nRBC: 0 % (ref 0.0–0.2)

## 2020-01-07 LAB — LIPASE, BLOOD: Lipase: 41 U/L (ref 11–51)

## 2020-01-07 LAB — LACTIC ACID, PLASMA: Lactic Acid, Venous: 0.7 mmol/L (ref 0.5–1.9)

## 2020-01-07 LAB — POC SARS CORONAVIRUS 2 AG -  ED: SARS Coronavirus 2 Ag: NEGATIVE

## 2020-01-07 MED ORDER — PROMETHAZINE HCL 25 MG/ML IJ SOLN
12.5000 mg | Freq: Once | INTRAMUSCULAR | Status: AC
  Administered 2020-01-07: 12.5 mg via INTRAVENOUS
  Filled 2020-01-07: qty 1

## 2020-01-07 MED ORDER — ONDANSETRON 4 MG PO TBDP
4.0000 mg | ORAL_TABLET | Freq: Once | ORAL | Status: AC
  Administered 2020-01-07: 4 mg via ORAL
  Filled 2020-01-07: qty 1

## 2020-01-07 MED ORDER — ONDANSETRON HCL 4 MG/2ML IJ SOLN
4.0000 mg | Freq: Once | INTRAMUSCULAR | Status: AC
  Administered 2020-01-07: 4 mg via INTRAVENOUS
  Filled 2020-01-07: qty 2

## 2020-01-07 MED ORDER — SODIUM CHLORIDE 0.9 % IV BOLUS
1000.0000 mL | Freq: Once | INTRAVENOUS | Status: AC
  Administered 2020-01-07: 1000 mL via INTRAVENOUS

## 2020-01-07 MED ORDER — FAMOTIDINE IN NACL 20-0.9 MG/50ML-% IV SOLN
20.0000 mg | Freq: Once | INTRAVENOUS | Status: AC
  Administered 2020-01-07: 20 mg via INTRAVENOUS
  Filled 2020-01-07: qty 50

## 2020-01-07 MED ORDER — MORPHINE SULFATE (PF) 4 MG/ML IV SOLN
4.0000 mg | Freq: Once | INTRAVENOUS | Status: AC
  Administered 2020-01-07: 4 mg via INTRAVENOUS
  Filled 2020-01-07: qty 1

## 2020-01-07 MED ORDER — HYDROMORPHONE HCL 1 MG/ML IJ SOLN
1.0000 mg | Freq: Once | INTRAMUSCULAR | Status: AC
  Administered 2020-01-07: 1 mg via INTRAVENOUS
  Filled 2020-01-07: qty 1

## 2020-01-07 NOTE — Telephone Encounter (Signed)
[employer] of CT Abdomen Pelvis 01/07/20 via Steffanie Dunn per request of PA Burgess Amor. Images shared to [facility])
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
## 2020-01-07 NOTE — ED Triage Notes (Signed)
Patient comes to the ED via [facility] due to vomiting brown emesis at least 4 times since 0400 today.  Patient just had hernia surgery performed in [facility] July 5th.  Patient has JP drain that he manages.

## 2020-01-07 NOTE — ED Notes (Signed)
Respiratory called to place pt on CPAP

## 2020-01-07 NOTE — ED Notes (Signed)
Family at bedside. 

## 2020-01-09 MED FILL — Hydromorphone HCl Inj 2 MG/ML: INTRAMUSCULAR | Qty: 1 | Status: AC

## 2020-01-28 ENCOUNTER — Other Ambulatory Visit: Payer: Self-pay

## 2020-01-28 ENCOUNTER — Emergency Department (HOSPITAL_COMMUNITY)
Admission: EM | Admit: 2020-01-28 | Discharge: 2020-01-28 | Disposition: A | Discharge status: Home / Self Care | Payer: No Typology Code available for payment source | Attending: Emergency Medicine | Admitting: Emergency Medicine

## 2020-01-28 ENCOUNTER — Encounter (HOSPITAL_COMMUNITY): Payer: Self-pay

## 2020-01-28 DIAGNOSIS — Y658 Other specified misadventures during surgical and medical care: Secondary | ICD-10-CM | POA: Diagnosis not present

## 2020-01-28 DIAGNOSIS — Z5321 Procedure and treatment not carried out due to patient leaving prior to being seen by health care provider: Secondary | ICD-10-CM | POA: Diagnosis not present

## 2020-01-28 DIAGNOSIS — T8189XA Other complications of procedures, not elsewhere classified, initial encounter: Secondary | ICD-10-CM | POA: Insufficient documentation

## 2020-01-28 NOTE — ED Triage Notes (Signed)
Pt reports abd hernia surgical repair on 7/5 and f/u with staples and tubes removed on 7/20, noticed wound "oozing today". Pt denies pain at this time.

## 2020-03-01 DIAGNOSIS — M79676 Pain in unspecified toe(s): Secondary | ICD-10-CM

## 2020-05-04 ENCOUNTER — Telehealth: Payer: Self-pay | Admitting: Podiatry

## 2020-05-04 ENCOUNTER — Ambulatory Visit (INDEPENDENT_AMBULATORY_CARE_PROVIDER_SITE_OTHER): Payer: BC Managed Care – PPO

## 2020-05-04 ENCOUNTER — Other Ambulatory Visit: Payer: Self-pay

## 2020-05-04 ENCOUNTER — Ambulatory Visit (INDEPENDENT_AMBULATORY_CARE_PROVIDER_SITE_OTHER): Payer: No Typology Code available for payment source | Admitting: Podiatry

## 2020-05-04 DIAGNOSIS — M7671 Peroneal tendinitis, right leg: Secondary | ICD-10-CM

## 2020-05-04 DIAGNOSIS — M79671 Pain in right foot: Secondary | ICD-10-CM | POA: Diagnosis not present

## 2020-05-04 MED ORDER — ACETAMINOPHEN-CODEINE #3 300-30 MG PO TABS: 1.0000 | ORAL_TABLET | ORAL | 0 refills | Status: DC | PRN

## 2020-05-04 NOTE — Addendum Note (Signed)
Addended by: Nicholes Rough on: 05/04/2020 02:57 PM   Modules accepted: Orders

## 2020-05-04 NOTE — Telephone Encounter (Signed)
Patient said his medication has not been called in. Pharmacy is the [facility] on [address] In [place].

## 2020-05-04 NOTE — Telephone Encounter (Signed)
done

## 2020-05-08 ENCOUNTER — Encounter: Payer: Self-pay | Admitting: Podiatry
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
## 2020-05-15 ENCOUNTER — Other Ambulatory Visit: Payer: Self-pay | Admitting: Podiatry

## 2020-05-15 DIAGNOSIS — M7671 Peroneal tendinitis, right leg: Secondary | ICD-10-CM

## 2020-06-01 ENCOUNTER — Ambulatory Visit: Payer: No Typology Code available for payment source | Admitting: Podiatry

## 2020-08-16 ENCOUNTER — Encounter (INDEPENDENT_AMBULATORY_CARE_PROVIDER_SITE_OTHER): Payer: Self-pay

## 2021-02-11 DIAGNOSIS — G4733 Obstructive sleep apnea (adult) (pediatric): Secondary | ICD-10-CM | POA: Diagnosis not present

## 2021-02-11 DIAGNOSIS — G629 Polyneuropathy, unspecified: Secondary | ICD-10-CM | POA: Diagnosis not present

## 2021-02-11 DIAGNOSIS — G8929 Other chronic pain: Secondary | ICD-10-CM | POA: Diagnosis not present

## 2021-02-11 DIAGNOSIS — Z825 Family history of asthma and other chronic lower respiratory diseases: Secondary | ICD-10-CM | POA: Diagnosis not present

## 2021-02-11 DIAGNOSIS — R69 Illness, unspecified: Secondary | ICD-10-CM | POA: Diagnosis not present

## 2021-02-11 DIAGNOSIS — Z823 Family history of stroke: Secondary | ICD-10-CM | POA: Diagnosis not present

## 2021-02-11 DIAGNOSIS — Z6841 Body Mass Index (BMI) 40.0 and over, adult: Secondary | ICD-10-CM | POA: Diagnosis not present

## 2021-02-11 DIAGNOSIS — I10 Essential (primary) hypertension: Secondary | ICD-10-CM | POA: Diagnosis not present

## 2021-04-26 ENCOUNTER — Ambulatory Visit (INDEPENDENT_AMBULATORY_CARE_PROVIDER_SITE_OTHER): Payer: No Typology Code available for payment source

## 2021-04-26 ENCOUNTER — Other Ambulatory Visit: Payer: Self-pay

## 2021-04-26 ENCOUNTER — Encounter: Payer: Self-pay | Admitting: Emergency Medicine

## 2021-04-26 ENCOUNTER — Ambulatory Visit
Admission: EM | Admit: 2021-04-26 | Discharge: 2021-04-26 | Disposition: A | Discharge status: Home / Self Care | Payer: Medicare HMO

## 2021-04-26 DIAGNOSIS — G8929 Other chronic pain: Secondary | ICD-10-CM

## 2021-04-26 DIAGNOSIS — M25512 Pain in left shoulder: Secondary | ICD-10-CM

## 2021-04-26 DIAGNOSIS — S4992XA Unspecified injury of left shoulder and upper arm, initial encounter: Secondary | ICD-10-CM | POA: Diagnosis not present

## 2021-04-26 MED ORDER — MELOXICAM 7.5 MG PO TABS: 7.5000 mg | ORAL_TABLET | Freq: Every day | ORAL | 0 refills | Status: DC

## 2021-04-26 MED ORDER — TIZANIDINE HCL 4 MG PO TABS: 4.0000 mg | ORAL_TABLET | Freq: Every day | ORAL | 0 refills | Status: DC

## 2021-04-26 MED ORDER — PREDNISONE 20 MG PO TABS: ORAL_TABLET | ORAL | 0 refills | Status: DC
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
## 2021-04-26 NOTE — ED Triage Notes (Signed)
Patient c/o LFT shoulder pain x 2 months.   Patient denies fall or trauma.   Patient endorses worsening shoulder pain these past 2 weeks.   Patient endorses upon onset of symptoms " when I tried to get up, I felt a pop and it's been hurting every since".   Patient hasn't used any medications for symptoms.

## 2021-05-09 DIAGNOSIS — Z6841 Body Mass Index (BMI) 40.0 and over, adult: Secondary | ICD-10-CM | POA: Diagnosis not present

## 2021-05-09 DIAGNOSIS — K439 Ventral hernia without obstruction or gangrene: Secondary | ICD-10-CM | POA: Diagnosis not present

## 2021-07-04 DIAGNOSIS — K439 Ventral hernia without obstruction or gangrene: Secondary | ICD-10-CM | POA: Diagnosis not present

## 2021-07-04 DIAGNOSIS — B999 Unspecified infectious disease: Secondary | ICD-10-CM | POA: Diagnosis not present

## 2021-08-10 DIAGNOSIS — Z743 Need for continuous supervision: Secondary | ICD-10-CM | POA: Diagnosis not present

## 2021-08-10 DIAGNOSIS — R1111 Vomiting without nausea: Secondary | ICD-10-CM | POA: Diagnosis not present

## 2021-08-10 DIAGNOSIS — I499 Cardiac arrhythmia, unspecified: Secondary | ICD-10-CM | POA: Diagnosis not present

## 2021-08-10 DIAGNOSIS — R110 Nausea: Secondary | ICD-10-CM | POA: Diagnosis not present

## 2021-08-10 DIAGNOSIS — R112 Nausea with vomiting, unspecified: Secondary | ICD-10-CM | POA: Diagnosis not present

## 2021-08-27 DIAGNOSIS — Z6841 Body Mass Index (BMI) 40.0 and over, adult: Secondary | ICD-10-CM | POA: Diagnosis not present

## 2021-09-17 ENCOUNTER — Emergency Department (HOSPITAL_COMMUNITY): Payer: No Typology Code available for payment source

## 2021-09-17 ENCOUNTER — Inpatient Hospital Stay: Payer: Self-pay

## 2021-09-17 ENCOUNTER — Inpatient Hospital Stay (HOSPITAL_COMMUNITY)
Admission: EM | Admit: 2021-09-17 | Discharge: 2021-09-20 | DRG: 637 | Disposition: A | Discharge status: Home / Self Care | Payer: No Typology Code available for payment source | Attending: Internal Medicine | Admitting: Internal Medicine

## 2021-09-17 ENCOUNTER — Inpatient Hospital Stay (HOSPITAL_COMMUNITY): Payer: No Typology Code available for payment source

## 2021-09-17 ENCOUNTER — Other Ambulatory Visit: Payer: Self-pay

## 2021-09-17 ENCOUNTER — Encounter (HOSPITAL_COMMUNITY): Payer: Self-pay | Admitting: *Deleted

## 2021-09-17 DIAGNOSIS — F4310 Post-traumatic stress disorder, unspecified: Secondary | ICD-10-CM | POA: Diagnosis present

## 2021-09-17 DIAGNOSIS — G894 Chronic pain syndrome: Secondary | ICD-10-CM | POA: Diagnosis not present

## 2021-09-17 DIAGNOSIS — D72829 Elevated white blood cell count, unspecified: Secondary | ICD-10-CM | POA: Diagnosis present

## 2021-09-17 DIAGNOSIS — R6510 Systemic inflammatory response syndrome (SIRS) of non-infectious origin without acute organ dysfunction: Secondary | ICD-10-CM | POA: Diagnosis present

## 2021-09-17 DIAGNOSIS — I119 Hypertensive heart disease without heart failure: Secondary | ICD-10-CM | POA: Diagnosis present

## 2021-09-17 DIAGNOSIS — E785 Hyperlipidemia, unspecified: Secondary | ICD-10-CM | POA: Diagnosis present

## 2021-09-17 DIAGNOSIS — Z91041 Radiographic dye allergy status: Secondary | ICD-10-CM

## 2021-09-17 DIAGNOSIS — M545 Low back pain, unspecified: Secondary | ICD-10-CM | POA: Diagnosis present

## 2021-09-17 DIAGNOSIS — Z91013 Allergy to seafood: Secondary | ICD-10-CM | POA: Diagnosis not present

## 2021-09-17 DIAGNOSIS — F419 Anxiety disorder, unspecified: Secondary | ICD-10-CM | POA: Diagnosis not present

## 2021-09-17 DIAGNOSIS — Z720 Tobacco use: Secondary | ICD-10-CM | POA: Diagnosis present

## 2021-09-17 DIAGNOSIS — E1100 Type 2 diabetes mellitus with hyperosmolarity without nonketotic hyperglycemic-hyperosmolar coma (NKHHC): Principal | ICD-10-CM | POA: Diagnosis present

## 2021-09-17 DIAGNOSIS — Z20822 Contact with and (suspected) exposure to covid-19: Secondary | ICD-10-CM | POA: Diagnosis present

## 2021-09-17 DIAGNOSIS — E876 Hypokalemia: Secondary | ICD-10-CM | POA: Diagnosis present

## 2021-09-17 DIAGNOSIS — E871 Hypo-osmolality and hyponatremia: Secondary | ICD-10-CM | POA: Diagnosis not present

## 2021-09-17 DIAGNOSIS — Q283 Other malformations of cerebral vessels: Secondary | ICD-10-CM | POA: Diagnosis not present

## 2021-09-17 DIAGNOSIS — R197 Diarrhea, unspecified: Secondary | ICD-10-CM | POA: Diagnosis present

## 2021-09-17 DIAGNOSIS — E7849 Other hyperlipidemia: Secondary | ICD-10-CM | POA: Diagnosis not present

## 2021-09-17 DIAGNOSIS — Z9081 Acquired absence of spleen: Secondary | ICD-10-CM

## 2021-09-17 DIAGNOSIS — E8809 Other disorders of plasma-protein metabolism, not elsewhere classified: Secondary | ICD-10-CM | POA: Diagnosis present

## 2021-09-17 DIAGNOSIS — Z6841 Body Mass Index (BMI) 40.0 and over, adult: Secondary | ICD-10-CM | POA: Diagnosis not present

## 2021-09-17 DIAGNOSIS — F17290 Nicotine dependence, other tobacco product, uncomplicated: Secondary | ICD-10-CM | POA: Diagnosis present

## 2021-09-17 DIAGNOSIS — E870 Hyperosmolality and hypernatremia: Secondary | ICD-10-CM | POA: Diagnosis not present

## 2021-09-17 DIAGNOSIS — Z823 Family history of stroke: Secondary | ICD-10-CM

## 2021-09-17 DIAGNOSIS — E875 Hyperkalemia: Secondary | ICD-10-CM | POA: Diagnosis present

## 2021-09-17 DIAGNOSIS — G9341 Metabolic encephalopathy: Secondary | ICD-10-CM | POA: Diagnosis present

## 2021-09-17 DIAGNOSIS — K439 Ventral hernia without obstruction or gangrene: Secondary | ICD-10-CM | POA: Diagnosis present

## 2021-09-17 DIAGNOSIS — I10 Essential (primary) hypertension: Secondary | ICD-10-CM | POA: Diagnosis not present

## 2021-09-17 DIAGNOSIS — J9601 Acute respiratory failure with hypoxia: Secondary | ICD-10-CM

## 2021-09-17 DIAGNOSIS — G4730 Sleep apnea, unspecified: Secondary | ICD-10-CM | POA: Diagnosis present

## 2021-09-17 DIAGNOSIS — Z79899 Other long term (current) drug therapy: Secondary | ICD-10-CM

## 2021-09-17 DIAGNOSIS — N179 Acute kidney failure, unspecified: Secondary | ICD-10-CM | POA: Diagnosis present

## 2021-09-17 DIAGNOSIS — F32A Depression, unspecified: Secondary | ICD-10-CM | POA: Diagnosis present

## 2021-09-17 DIAGNOSIS — R7989 Other specified abnormal findings of blood chemistry: Secondary | ICD-10-CM | POA: Diagnosis present

## 2021-09-17 DIAGNOSIS — G8929 Other chronic pain: Secondary | ICD-10-CM | POA: Diagnosis present

## 2021-09-17 DIAGNOSIS — G9340 Encephalopathy, unspecified: Secondary | ICD-10-CM

## 2021-09-17 DIAGNOSIS — Z87442 Personal history of urinary calculi: Secondary | ICD-10-CM

## 2021-09-17 DIAGNOSIS — E860 Dehydration: Secondary | ICD-10-CM | POA: Diagnosis present

## 2021-09-17 LAB — BASIC METABOLIC PANEL
Anion gap: 17 — ABNORMAL HIGH (ref 5–15)
Anion gap: 17 — ABNORMAL HIGH (ref 5–15)
Anion gap: 12 (ref 5–15)
BUN: 28 mg/dL — ABNORMAL HIGH (ref 6–20)
BUN: 24 mg/dL — ABNORMAL HIGH (ref 6–20)
BUN: 23 mg/dL — ABNORMAL HIGH (ref 6–20)
CO2: 21 mmol/L — ABNORMAL LOW (ref 22–32)
CO2: 24 mmol/L (ref 22–32)
CO2: 26 mmol/L (ref 22–32)
Calcium: 10.3 mg/dL (ref 8.9–10.3)
Calcium: 10.5 mg/dL — ABNORMAL HIGH (ref 8.9–10.3)
Calcium: 10.3 mg/dL (ref 8.9–10.3)
Chloride: 101 mmol/L (ref 98–111)
Chloride: 105 mmol/L (ref 98–111)
Chloride: 110 mmol/L (ref 98–111)
Creatinine, Ser: 1.5 mg/dL — ABNORMAL HIGH (ref 0.61–1.24)
Creatinine, Ser: 1.39 mg/dL — ABNORMAL HIGH (ref 0.61–1.24)
Creatinine, Ser: 1.43 mg/dL — ABNORMAL HIGH (ref 0.61–1.24)
GFR, Estimated: 59 mL/min — ABNORMAL LOW (ref >60)
GFR, Estimated: >60 mL/min (ref >60)
GFR, Estimated: >60 mL/min (ref >60)
Glucose, Bld: 1067 mg/dL (ref 70–99)
Glucose, Bld: 711 mg/dL (ref 70–99)
Glucose, Bld: 504 mg/dL (ref 70–99)
Potassium: 3.8 mmol/L (ref 3.5–5.1)
Potassium: 3.6 mmol/L (ref 3.5–5.1)
Potassium: 3.6 mmol/L (ref 3.5–5.1)
Sodium: 139 mmol/L (ref 135–145)
Sodium: 146 mmol/L — ABNORMAL HIGH (ref 135–145)
Sodium: 148 mmol/L — ABNORMAL HIGH (ref 135–145)

## 2021-09-17 LAB — RAPID URINE DRUG SCREEN, HOSP PERFORMED
Amphetamines: NOT DETECTED
Barbiturates: NOT DETECTED
Benzodiazepines: NOT DETECTED
Cocaine: NOT DETECTED
Opiates: NOT DETECTED
Tetrahydrocannabinol: NOT DETECTED

## 2021-09-17 LAB — CBC WITH DIFFERENTIAL/PLATELET
Abs Immature Granulocytes: 0.04 10*3/uL (ref 0.00–0.07)
Basophils Absolute: 0.1 10*3/uL (ref 0.0–0.1)
Basophils Relative: 1 %
Eosinophils Absolute: 0 10*3/uL (ref 0.0–0.5)
Eosinophils Relative: 0 %
HCT: 45.1 % (ref 39.0–52.0)
Hemoglobin: 14.8 g/dL (ref 13.0–17.0)
Immature Granulocytes: 0 %
Lymphocytes Relative: 8 %
Lymphs Abs: 0.9 10*3/uL (ref 0.7–4.0)
MCH: 27.7 pg (ref 26.0–34.0)
MCHC: 32.8 g/dL (ref 30.0–36.0)
MCV: 84.5 fL (ref 80.0–100.0)
Monocytes Absolute: 0.8 10*3/uL (ref 0.1–1.0)
Monocytes Relative: 7 %
Neutro Abs: 9.3 10*3/uL — ABNORMAL HIGH (ref 1.7–7.7)
Neutrophils Relative %: 84 %
Platelets: 372 10*3/uL (ref 150–400)
RBC: 5.34 MIL/uL (ref 4.22–5.81)
RDW: 16.4 % — ABNORMAL HIGH (ref 11.5–15.5)
WBC: 11.1 10*3/uL — ABNORMAL HIGH (ref 4.0–10.5)
nRBC: 0 % (ref 0.0–0.2)

## 2021-09-17 LAB — URINALYSIS, ROUTINE W REFLEX MICROSCOPIC
Bacteria, UA: NONE SEEN
Bilirubin Urine: NEGATIVE
Glucose, UA: >=500 mg/dL — AB
Ketones, ur: NEGATIVE mg/dL
Leukocytes,Ua: NEGATIVE
Nitrite: NEGATIVE
Protein, ur: NEGATIVE mg/dL
Specific Gravity, Urine: 1.028 (ref 1.005–1.030)
pH: 6 (ref 5.0–8.0)

## 2021-09-17 LAB — CBG MONITORING, ED
Glucose-Capillary: >600 mg/dL (ref 70–99)
Glucose-Capillary: >600 mg/dL (ref 70–99)
Glucose-Capillary: >600 mg/dL (ref 70–99)
Glucose-Capillary: >600 mg/dL (ref 70–99)
Glucose-Capillary: >600 mg/dL (ref 70–99)
Glucose-Capillary: >600 mg/dL (ref 70–99)

## 2021-09-17 LAB — BLOOD GAS, ARTERIAL
Acid-base deficit: 2 mmol/L (ref 0.0–2.0)
Bicarbonate: 25.1 mmol/L (ref 20.0–28.0)
Drawn by: 2407
FIO2: 40 %
O2 Saturation: 95.4 %
Patient temperature: 36.5
pCO2 arterial: 50 mmHg — ABNORMAL HIGH (ref 32–48)
pH, Arterial: 7.31 — ABNORMAL LOW (ref 7.35–7.45)
pO2, Arterial: 73 mmHg — ABNORMAL LOW (ref 83–108)

## 2021-09-17 LAB — COMPREHENSIVE METABOLIC PANEL
ALT: 59 U/L — ABNORMAL HIGH (ref 0–44)
AST: 38 U/L (ref 15–41)
Albumin: 3.8 g/dL (ref 3.5–5.0)
Alkaline Phosphatase: 112 U/L (ref 38–126)
Anion gap: 16 — ABNORMAL HIGH (ref 5–15)
BUN: 28 mg/dL — ABNORMAL HIGH (ref 6–20)
CO2: 23 mmol/L (ref 22–32)
Calcium: 10.4 mg/dL — ABNORMAL HIGH (ref 8.9–10.3)
Chloride: 91 mmol/L — ABNORMAL LOW (ref 98–111)
Creatinine, Ser: 1.53 mg/dL — ABNORMAL HIGH (ref 0.61–1.24)
GFR, Estimated: 57 mL/min — ABNORMAL LOW (ref >60)
Glucose, Bld: 1576 mg/dL (ref 70–99)
Potassium: 5.4 mmol/L — ABNORMAL HIGH (ref 3.5–5.1)
Sodium: 130 mmol/L — ABNORMAL LOW (ref 135–145)
Total Bilirubin: 0.6 mg/dL (ref 0.3–1.2)
Total Protein: 8.5 g/dL — ABNORMAL HIGH (ref 6.5–8.1)

## 2021-09-17 LAB — OSMOLALITY
Osmolality: 395 mOsm/kg (ref 275–295)
Osmolality: 374 mOsm/kg (ref 275–295)

## 2021-09-17 LAB — GLUCOSE, CAPILLARY
Glucose-Capillary: 331 mg/dL — ABNORMAL HIGH (ref 70–99)
Glucose-Capillary: 387 mg/dL — ABNORMAL HIGH (ref 70–99)
Glucose-Capillary: 407 mg/dL — ABNORMAL HIGH (ref 70–99)
Glucose-Capillary: 446 mg/dL — ABNORMAL HIGH (ref 70–99)
Glucose-Capillary: 467 mg/dL — ABNORMAL HIGH (ref 70–99)
Glucose-Capillary: 479 mg/dL — ABNORMAL HIGH (ref 70–99)
Glucose-Capillary: 498 mg/dL — ABNORMAL HIGH (ref 70–99)
Glucose-Capillary: 544 mg/dL (ref 70–99)
Glucose-Capillary: 548 mg/dL (ref 70–99)
Glucose-Capillary: 591 mg/dL (ref 70–99)
Glucose-Capillary: >600 mg/dL (ref 70–99)
Glucose-Capillary: >600 mg/dL (ref 70–99)

## 2021-09-17 LAB — BETA-HYDROXYBUTYRIC ACID
Beta-Hydroxybutyric Acid: 1.56 mmol/L — ABNORMAL HIGH (ref 0.05–0.27)
Beta-Hydroxybutyric Acid: 0.3 mmol/L — ABNORMAL HIGH (ref 0.05–0.27)

## 2021-09-17 LAB — LACTIC ACID, PLASMA
Lactic Acid, Venous: 4.8 mmol/L (ref 0.5–1.9)
Lactic Acid, Venous: 3.9 mmol/L (ref 0.5–1.9)

## 2021-09-17 LAB — RESP PANEL BY RT-PCR (FLU A&B, COVID) ARPGX2
Influenza A by PCR: NEGATIVE
Influenza B by PCR: NEGATIVE
SARS Coronavirus 2 by RT PCR: NEGATIVE

## 2021-09-17 LAB — AMMONIA: Ammonia: 14 umol/L (ref 9–35)

## 2021-09-17 LAB — ETHANOL: Alcohol, Ethyl (B): <10 mg/dL (ref <10)

## 2021-09-17 LAB — PROTIME-INR
INR: 1.1 (ref 0.8–1.2)
Prothrombin Time: 13.7 seconds (ref 11.4–15.2)

## 2021-09-17 LAB — MAGNESIUM: Magnesium: 2.7 mg/dL — ABNORMAL HIGH (ref 1.7–2.4)

## 2021-09-17 LAB — HIV ANTIBODY (ROUTINE TESTING W REFLEX): HIV Screen 4th Generation wRfx: NONREACTIVE

## 2021-09-17 LAB — HEMOGLOBIN A1C
Hgb A1c MFr Bld: 12.3 % — ABNORMAL HIGH (ref 4.8–5.6)
Mean Plasma Glucose: 306.31 mg/dL

## 2021-09-17 LAB — APTT: aPTT: 24 seconds (ref 24–36)

## 2021-09-17 LAB — MRSA NEXT GEN BY PCR, NASAL: MRSA by PCR Next Gen: NOT DETECTED

## 2021-09-17 MED ORDER — DEXTROSE IN LACTATED RINGERS 5 % IV SOLN: INTRAVENOUS | Status: DC

## 2021-09-17 MED ORDER — NICOTINE 21 MG/24HR TD PT24
21.0000 mg | MEDICATED_PATCH | Freq: Every day | TRANSDERMAL | Status: DC
  Administered 2021-09-17 – 2021-09-20 (×4): 21 mg via TRANSDERMAL
  Filled 2021-09-17 (×4): qty 1

## 2021-09-17 MED ORDER — HYDRALAZINE HCL 20 MG/ML IJ SOLN
10.0000 mg | Freq: Four times a day (QID) | INTRAMUSCULAR | Status: DC | PRN
  Administered 2021-09-18: 10 mg via INTRAVENOUS
  Filled 2021-09-17: qty 1

## 2021-09-17 MED ORDER — LACTATED RINGERS IV SOLN: INTRAVENOUS | Status: DC

## 2021-09-17 MED ORDER — SODIUM CHLORIDE 0.9 % IV BOLUS
2000.0000 mL | Freq: Once | INTRAVENOUS | Status: AC
  Administered 2021-09-17: 2000 mL via INTRAVENOUS

## 2021-09-17 MED ORDER — SODIUM CHLORIDE 0.9 % IV SOLN: INTRAVENOUS | Status: DC

## 2021-09-17 MED ORDER — LACTATED RINGERS IV BOLUS
1000.0000 mL | Freq: Once | INTRAVENOUS | Status: AC
  Administered 2021-09-17: 1000 mL via INTRAVENOUS

## 2021-09-17 MED ORDER — DEXTROSE 50 % IV SOLN: 0.0000 mL | INTRAVENOUS | Status: DC | PRN

## 2021-09-17 MED ORDER — SODIUM CHLORIDE 0.9 % IV SOLN
2.0000 g | Freq: Three times a day (TID) | INTRAVENOUS | Status: DC
  Administered 2021-09-17 – 2021-09-19 (×6): 2 g via INTRAVENOUS
  Filled 2021-09-17 (×6): qty 2

## 2021-09-17 MED ORDER — NALOXONE HCL 0.4 MG/ML IJ SOLN
0.4000 mg | Freq: Once | INTRAMUSCULAR | Status: AC
  Administered 2021-09-17: 0.4 mg via INTRAVENOUS
  Filled 2021-09-17: qty 1

## 2021-09-17 MED ORDER — SODIUM CHLORIDE 0.9 % IV SOLN
500.0000 mg | INTRAVENOUS | Status: DC
  Administered 2021-09-17: 500 mg via INTRAVENOUS
  Filled 2021-09-17: qty 5

## 2021-09-17 MED ORDER — INSULIN REGULAR(HUMAN) IN NACL 100-0.9 UT/100ML-% IV SOLN
INTRAVENOUS | Status: DC
  Administered 2021-09-17 – 2021-09-18 (×3): 15 [IU]/h via INTRAVENOUS
  Administered 2021-09-18: 9.5 [IU]/h via INTRAVENOUS
  Filled 2021-09-17 (×4): qty 100

## 2021-09-17 MED ORDER — ENOXAPARIN SODIUM 100 MG/ML IJ SOSY
100.0000 mg | PREFILLED_SYRINGE | INTRAMUSCULAR | Status: DC
  Administered 2021-09-17 – 2021-09-20 (×3): 100 mg via SUBCUTANEOUS
  Filled 2021-09-17 (×3): qty 1

## 2021-09-17 MED ORDER — DOXYCYCLINE HYCLATE 100 MG IV SOLR
100.0000 mg | Freq: Once | INTRAVENOUS | Status: AC
  Administered 2021-09-17: 100 mg via INTRAVENOUS
  Filled 2021-09-17: qty 100

## 2021-09-17 MED ORDER — SODIUM CHLORIDE 0.9 % IV SOLN
2.0000 g | INTRAVENOUS | Status: DC
  Administered 2021-09-17: 2 g via INTRAVENOUS
  Filled 2021-09-17: qty 20

## 2021-09-17 MED ORDER — INSULIN REGULAR(HUMAN) IN NACL 100-0.9 UT/100ML-% IV SOLN: INTRAVENOUS | Status: DC

## 2021-09-17 MED ORDER — SERTRALINE HCL 50 MG PO TABS
200.0000 mg | ORAL_TABLET | Freq: Every day | ORAL | Status: DC
  Administered 2021-09-18 – 2021-09-20 (×3): 200 mg via ORAL
  Filled 2021-09-17 (×3): qty 4

## 2021-09-17 MED ORDER — CHLORHEXIDINE GLUCONATE CLOTH 2 % EX PADS
6.0000 | MEDICATED_PAD | Freq: Every day | CUTANEOUS | Status: DC
  Administered 2021-09-18 – 2021-09-20 (×3): 6 via TOPICAL

## 2021-09-17 NOTE — ED Notes (Signed)
Date and time results received: 09/17/21 4:18 PM ? ? ?Test: Serum Osmolality ?Critical Value: 395 ? ?Name of Provider Notified: Dr.Thomas ? ?Orders Received? Or Actions Taken?: see orders ?

## 2021-09-17 NOTE — ED Notes (Signed)
Date and time results received: 09/17/21 1200 ? ? ?Test: Glucose & Critical Value: Glucose 1576 & Lactic acid 4.8 ? ?Name of Provider Notified: Dr. Regenia Skeeter ? ?Orders Received? Or Actions Taken?: Notified ?

## 2021-09-17 NOTE — Assessment & Plan Note (Addendum)
-   Likely secondary to a prerenal azotemia in the setting of GI losses as per ED physicians note patient noted to have had some bouts of diarrhea and emesis prior to admission. ?-Last creatinine noted of 0.74 on 05/24/2021. ?-Urinalysis with nitrite negative, leukocytes negative, protein negative. ?-Hydrate aggressively with IV fluids and now fluids have been changed to LR at 75 MLS per hour ?-Patient's BUNs/creatinine is improved as patient's BUNs/creatinine went from 28/1.50 is now 21/0.99 -> 16/0.74 ?-Strict I's and O's, daily weights. ?-Renal ultrasound done and showed "Normal kidneys.  No hydronephrosis. Bladder not identified.  Morbid obesity.  Foley catheter" ?-Avoid further nephrotoxic medications, contrast dyes if possible, hypotension and renally dose medications ?-Continue to Follow Renal Fxn closely and repeat CMP in the a.m. ?

## 2021-09-17 NOTE — Assessment & Plan Note (Signed)
-   Lifestyle modification, weight loss. ?-Patient being seen by general surgery in the outpatient setting who have recommended evaluation by bariatric surgery. ?-Outpatient follow-up. ?

## 2021-09-17 NOTE — Assessment & Plan Note (Addendum)
-  Likely secondary to hyperosmolar hyperglycemic state. ?-Now his potassium level was  3.2 on last check ?-Endo tool has been now discontinued and IV fluid hydration has also stopped  ?-Follow. ?-If worsening may need Lokelma. ?

## 2021-09-17 NOTE — Progress Notes (Signed)
Date and time results received: 09/17/21 2155 ? ? ? ?Test: Glucose ? ?Critical Value: 504 ? ?Name of Provider Notified: MD aware  ? ?Orders Received? Or Actions Taken?: Glucose currently being managed on endotool ?

## 2021-09-17 NOTE — ED Notes (Signed)
Pt had a bowel movement while placing foley. Incontinence care done and new bed sheets, gown, and incontinence pads placed. Pt hooked back up to cardiac monitor. Care on going. Nurse aware.  ?
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
## 2021-09-17 NOTE — Assessment & Plan Note (Addendum)
-  Stable. ?-Hold home pain regimen for now secondary to altered mental status and resume as able once his mentation is improved ?-Outpatient follow-up. ?

## 2021-09-17 NOTE — Assessment & Plan Note (Addendum)
-  See principal problem hyperosmolar hyperglycemic state. ?

## 2021-09-17 NOTE — Assessment & Plan Note (Addendum)
-  Patient was initially confused but is much more awake and alert and appropriate, once acute metabolic encephalopathy resolved we will need to discuss about tobacco cessation. ?-Nicotine patch 21 mg TD ordered  ?

## 2021-09-17 NOTE — Assessment & Plan Note (Addendum)
-  Checked a fasting lipid panel and showed a total cholesterol/HDL ratio of 6.4, cholesterol level 216, HDL 34, LDL dose unable to be calculated, direct LDL of 129.2, and triglyceride level 429 with a VLDL that was unable to be calculated ?-Likely would benefit from a statin but will hold off given his abnormal LFTs and referred PCP to initiate ?-Outpatient follow-up. ?

## 2021-09-17 NOTE — ED Notes (Signed)
Pt in CT unable to check bs.  ?

## 2021-09-17 NOTE — ED Notes (Signed)
Mittens applied to bilateral hands so pt will not pull at cardiac monitoring or IV's.  ?

## 2021-09-17 NOTE — Progress Notes (Signed)
Pharmacy Antibiotic Note ? ?Sean Murillo is a 46 y.o. male admitted on 09/17/2021 with sepsis.  Pharmacy has been consulted for cefepime dosing. ? ?Plan: ?Cefepime 2000 mg IV every 8 hours. ?Monitor labs, c/s, and patient improvement. ? ?Height: 5\' 9"  (175.3 cm) ?Weight: (!) 204.1 kg (450 lb) ?IBW/kg (Calculated) : 70.7 ? ?Temp (24hrs), Avg:97.6 ?F (36.4 ?C), Min:97.6 ?F (36.4 ?C), Max:97.6 ?F (36.4 ?C) ? ?Recent Labs  ?Lab 09/17/21 ?1137 09/17/21 ?1315  ?WBC 11.1*  --   ?CREATININE 1.53*  --   ?LATICACIDVEN 4.8* 3.9*  ?  ?Estimated Creatinine Clearance: 109.3 mL/min (A) (by C-G formula based on SCr of 1.53 mg/dL (H)).   ? ?Allergies  ?Allergen Reactions  ? Contrast Media [Iodinated Contrast Media] Hives  ?  Iohexol  ? Shellfish Allergy Hives  ? ? ?Antimicrobials this admission: ?Cefepime 3/21 >> ?Azith 3/21 >> ?CTX 3/21 ? ?Microbiology results: ?3/21 BCx: pending ? ? ?Thank you for allowing pharmacy to be a part of this patient?s care. ? ?4/21, PharmD ?Clinical Pharmacist ?09/17/2021 2:02 PM ? ? ?

## 2021-09-17 NOTE — ED Triage Notes (Signed)
Pt brought in by [facility] from home with c/o AMS. Pt's CBG was reading high on meter with no hx of diabetes. Pt was not answering questions appropriately for EMS. 20g IV left a/c. 500 NS bolus given by EMS. No hx of diabetes. HR 130-155, O2 sat 87% on RA, RR 25 for EMS. Wife reports heavy diarrhea and vomiting over the weekend. Pt is also incontinent of urine which is abnormal for him.  ?

## 2021-09-17 NOTE — ED Provider Notes (Signed)
[facility] EMERGENCY DEPARTMENT Provider Note   CSN: [id number] Arrival date & time: 09/17/21  0955  LEVEL 5 CAVEAT - ALTERED MENTAL STATUS   History  Chief Complaint  Patient presents with   Altered Mental Status    Sean Murillo is a 46 y.o. male.  HPI 46 year old male presents with altered mental status.  History is primarily from the wife as the patient is pretty altered.  He started having diarrhea about 4 or 5 days ago.  Over the weekend he developed some vomiting.  He is also developed severe increased thirst.  He has never been told he has diabetes.  This morning woke up and was altered.  No cough but EMS reported his sats were about 87% on room air and they put him on 6 L.  His CBG was also reading high.  No fever per the wife, who has checked.  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  Difficult to get any significant history from the patient as he is quickly falling asleep.  Home Medications Prior to Admission medications   Medication Sig Start Date End Date Taking? Authorizing Provider  lisinopril (ZESTRIL) 40 MG tablet Take 40 mg by mouth daily. 12/11/20  Yes [provider]  naproxen (NAPROSYN) 500 MG tablet Take 1 tablet by mouth 2 (two) times daily as needed for mild pain. 05/24/21  Yes [provider]  sertraline (ZOLOFT) 100 MG tablet Take 200 mg by mouth daily.    Yes [provider]  tiZANidine (ZANAFLEX) 4 MG tablet Take 1 tablet (4 mg total) by mouth at bedtime. 04/26/21  Yes Wallis Bamberg, PA-C  acetaminophen-codeine (TYLENOL #3) 300-30 MG tablet Take 1-2 tablets by mouth every 4 (four) hours as needed for moderate pain. Patient not taking: Reported on 09/17/2021 05/04/20   Candelaria Stagers, DPM  HYDROcodone-acetaminophen (NORCO/VICODIN) 5-325 MG tablet Take 1 tablet by mouth every 4 (four) hours as needed. Patient not taking: Reported on 09/17/2021 12/23/19   Bethel Born, PA-C  meloxicam  (MOBIC) 7.5 MG tablet Take 1 tablet (7.5 mg total) by mouth daily. Patient not taking: Reported on 09/17/2021 04/26/21   Wallis Bamberg, PA-C  predniSONE (DELTASONE) 20 MG tablet Take 2 tablets daily with breakfast. Patient not taking: Reported on 09/17/2021 04/26/21   Wallis Bamberg, PA-C      Allergies    Contrast media [iodinated contrast media] and Shellfish allergy    Review of Systems   Review of Systems  Unable to perform ROS: Mental status change   Physical Exam Updated Vital Signs BP 121/78   Pulse (!) 125   Temp 98.6 F (37 C) (Oral)   Resp (!) 29   Ht 5\' 9"  (1.753 m)   Wt (!) 198.1 kg   SpO2 91%   BMI 64.49 kg/m  Physical Exam Vitals and nursing note reviewed.  Constitutional:      Appearance: He is well-developed. He is obese. He is not diaphoretic.  HENT:     Head: Normocephalic and atraumatic.  Eyes:     Pupils: Pupils are equal, round, and reactive to light.  Cardiovascular:     Rate and Rhythm: Regular rhythm. Tachycardia present.     Heart sounds: Normal heart sounds.  Pulmonary:     Effort: Pulmonary effort is normal.     Breath sounds: Normal breath sounds.  Abdominal:     Palpations: Abdomen is soft.     Tenderness: There is  no abdominal tenderness.  Musculoskeletal:     Cervical back: Normal range of motion. No rigidity.     Comments: No pain or tenderness to the back  Skin:    General: Skin is warm and dry.  Neurological:     Mental Status: He is lethargic.     Comments: Patient will awaken to voice or light touch.  He tries to talk to me a little bit and is able to tell me the day of the week (off by 1 day) but then sometimes just does not answer questions.  He moves all 4 extremities and will sit up with assistance.    ED Results / Procedures / Treatments   Labs (all labs ordered are listed, but only abnormal results are displayed) Labs Reviewed  COMPREHENSIVE METABOLIC PANEL - Abnormal; Notable for the following components:      Result Value    Sodium 130 (*)    Potassium 5.4 (*)    Chloride 91 (*)    Glucose, Bld 1,576 (*)    BUN 28 (*)    Creatinine, Ser 1.53 (*)    Calcium 10.4 (*)    Total Protein 8.5 (*)    ALT 59 (*)    GFR, Estimated 57 (*)    Anion gap 16 (*)    All other components within normal limits  BLOOD GAS, ARTERIAL - Abnormal; Notable for the following components:   pH, Arterial 7.31 (*)    pCO2 arterial 50 (*)    pO2, Arterial 73 (*)    All other components within normal limits  URINALYSIS, ROUTINE W REFLEX MICROSCOPIC - Abnormal; Notable for the following components:   Color, Urine STRAW (*)    Glucose, UA >=500 (*)    Hgb urine dipstick SMALL (*)    All other components within normal limits  CBC WITH DIFFERENTIAL/PLATELET - Abnormal; Notable for the following components:   WBC 11.1 (*)    RDW 16.4 (*)    Neutro Abs 9.3 (*)    All other components within normal limits  OSMOLALITY - Abnormal; Notable for the following components:   Osmolality 395 (*)    All other components within normal limits  MAGNESIUM - Abnormal; Notable for the following components:   Magnesium 2.7 (*)    All other components within normal limits  LACTIC ACID, PLASMA - Abnormal; Notable for the following components:   Lactic Acid, Venous 4.8 (*)    All other components within normal limits  LACTIC ACID, PLASMA - Abnormal; Notable for the following components:   Lactic Acid, Venous 3.9 (*)    All other components within normal limits  BASIC METABOLIC PANEL - Abnormal; Notable for the following components:   CO2 21 (*)    Glucose, Bld 1,067 (*)    BUN 28 (*)    Creatinine, Ser 1.50 (*)    GFR, Estimated 59 (*)    Anion gap 17 (*)    All other components within normal limits  BETA-HYDROXYBUTYRIC ACID - Abnormal; Notable for the following components:   Beta-Hydroxybutyric Acid 1.56 (*)    All other components within normal limits  GLUCOSE, CAPILLARY - Abnormal; Notable for the following components:   Glucose-Capillary  >600 (*)    All other components within normal limits  GLUCOSE, CAPILLARY - Abnormal; Notable for the following components:   Glucose-Capillary >600 (*)    All other components within normal limits  CBG MONITORING, ED - Abnormal; Notable for the following components:   Glucose-Capillary >  600 (*)    All other components within normal limits  CBG MONITORING, ED - Abnormal; Notable for the following components:   Glucose-Capillary >600 (*)    All other components within normal limits  CBG MONITORING, ED - Abnormal; Notable for the following components:   Glucose-Capillary >600 (*)    All other components within normal limits  CBG MONITORING, ED - Abnormal; Notable for the following components:   Glucose-Capillary >600 (*)    All other components within normal limits  CBG MONITORING, ED - Abnormal; Notable for the following components:   Glucose-Capillary >600 (*)    All other components within normal limits  CBG MONITORING, ED - Abnormal; Notable for the following components:   Glucose-Capillary >600 (*)    All other components within normal limits  RESP PANEL BY RT-PCR (FLU A&B, COVID) ARPGX2  CULTURE, BLOOD (ROUTINE X 2)  CULTURE, BLOOD (ROUTINE X 2)  URINE CULTURE  C DIFFICILE QUICK SCREEN W PCR REFLEX    MRSA NEXT GEN BY PCR, NASAL  RAPID URINE DRUG SCREEN, HOSP PERFORMED  ETHANOL  AMMONIA  PROTIME-INR  APTT  BASIC METABOLIC PANEL  BASIC METABOLIC PANEL  BETA-HYDROXYBUTYRIC ACID  HIV ANTIBODY (ROUTINE TESTING W REFLEX)  OSMOLALITY  HEMOGLOBIN A1C  BASIC METABOLIC PANEL  BASIC METABOLIC PANEL  BETA-HYDROXYBUTYRIC ACID  PHOSPHORUS  MAGNESIUM  LIPID PANEL  CBC WITH DIFFERENTIAL/PLATELET  I-STAT CHEM 8, ED    EKG EKG Interpretation  Date/Time:  Tuesday September 17 2021 10:04:19 EDT Ventricular Rate:  134 PR Interval:  99 QRS Duration: 123 QT Interval:  409 QTC Calculation: 611 R Axis:   41 Text Interpretation: Sinus tachycardia Nonspecific intraventricular  conduction delay Abnormal inferior Q waves Confirmed by Pricilla Loveless [phone]) on 09/17/2021 10:28:04 AM  Radiology CT Head Wo Contrast  Result Date: 09/17/2021 CLINICAL DATA:  Mental status change.  Follow-up abnormal CT EXAM: CT HEAD WITHOUT CONTRAST TECHNIQUE: Contiguous axial images were obtained from the base of the skull through the vertex without intravenous contrast. RADIATION DOSE REDUCTION: This exam was performed according to the departmental dose-optimization program which includes automated exposure control, adjustment of the mA and/or kV according to patient size and/or use of iterative reconstruction technique. COMPARISON:  CT head 09/17/2021 FINDINGS: Brain: Image quality limited due to large patient size and streak artifact. The small area of increased density in the right frontal lobe is less apparent on the current study and has become difficult to visualize. No definite mass or infarct. Ventricle size normal. Vascular: Negative for hyperdense vessel Skull: Negative Sinuses/Orbits: Mild mucosal edema paranasal sinuses. Negative orbit Other: None IMPRESSION: Image quality degraded by streak artifact from large patient size. Subtle hyperdensity in the right frontal lobe is less apparent. I doubt this represents acute hemorrhage. This could be chronic hemorrhage due to cavernoma or calcification. Consider MRI if the patient is able to fit in the MRI machine. Electronically Signed   By: Marlan Palau M.D.   On: 09/17/2021 15:10   CT Head Wo Contrast  Result Date: 09/17/2021 CLINICAL DATA:  Mental status change, unknown cause EXAM: CT HEAD WITHOUT CONTRAST TECHNIQUE: Contiguous axial images were obtained from the base of the skull through the vertex without intravenous contrast. RADIATION DOSE REDUCTION: This exam was performed according to the departmental dose-optimization program which includes automated exposure control, adjustment of the mA and/or kV according to patient size and/or use  of iterative reconstruction technique. COMPARISON:  CT head October 06, 2008. FINDINGS: Mildly motion limited study. Brain: Apparent  small, subcentimeter ill-defined hyperdensity within the right frontal lobe (series 3, image 21). This could potentially represent a small amount of acute hemorrhage versus artifact given streak artifact in this region. No evidence of acute large vascular territory, mass lesion, midline shift, or hydrocephalus. The inferior aspect of the posterior fossa is not imaged on this study. Vascular: No hyperdense vessel identified. Skull: No acute fracture. Sinuses/Orbits: Largely clear sinuses.  No acute orbital findings. Other: No mastoid effusions. IMPRESSION: Apparent small, subcentimeter ill-defined hyperdensity within the right frontal lobe (series 3, image 21). This could potentially represent a small amount of acute hemorrhage versus artifact given streak artifact in this region. No appreciable edema or mass effect. Short interval follow-up CT is recommended to assess for persistence. Findings and recommendations discussed with Dr. Criss Alvine Via telephone at 12:44 p.m. Electronically Signed   By: Feliberto Harts M.D.   On: 09/17/2021 12:49   DG Chest Portable 1 View  Result Date: 09/17/2021 CLINICAL DATA:  Altered mental status, hypoxia EXAM: PORTABLE CHEST 1 VIEW COMPARISON:  01/08/2020 FINDINGS: Transverse diameter of heart is increased. Apparent shift of mediastinum to the right may be due to rotation. Central pulmonary vessels are prominent. There are no signs of alveolar pulmonary edema. Increased density is seen in the lower lung fields which may be due to chest wall attenuation or suggest underlying small effusions and infiltrates. There is no pneumothorax. IMPRESSION: Cardiomegaly. There are no signs of pulmonary edema. Increased density in both lower lung fields may be related to chest wall attenuation or pleural effusions and underlying infiltrates. Electronically Signed    By: Ernie Avena M.D.   On: 09/17/2021 10:55   [place] EKG SITE RITE  Result Date: 09/17/2021 If Site Rite image not attached, placement could not be confirmed due to current cardiac rhythm.   Procedures .Critical Care Performed by: Pricilla Loveless, MD Authorized by: Pricilla Loveless, MD   Critical care provider statement:    Critical care time (minutes):  60   Critical care time was exclusive of:  Separately billable procedures and treating other patients   Critical care was necessary to treat or prevent imminent or life-threatening deterioration of the following conditions:  Respiratory failure and CNS failure or compromise   Critical care was time spent personally by me on the following activities:  Development of treatment plan with patient or surrogate, discussions with consultants, evaluation of patient's response to treatment, examination of patient, ordering and review of laboratory studies, ordering and review of radiographic studies, ordering and performing treatments and interventions, pulse oximetry, re-evaluation of patient's condition and review of old charts    Medications Ordered in ED Medications  insulin regular, human (MYXREDLIN) 100 units/ 100 mL infusion (15 Units/hr Intravenous New Bag/Given 09/17/21 1300)  dextrose 50 % solution 0-50 mL (has no administration in time range)  ceFEPIme (MAXIPIME) 2 g in sodium chloride 0.9 % 100 mL IVPB (0 g Intravenous Stopped 09/17/21 1639)  sertraline (ZOLOFT) tablet 200 mg (200 mg Oral Not Given 09/17/21 1529)  enoxaparin (LOVENOX) injection 100 mg (has no administration in time range)  dextrose 5 % in lactated ringers infusion (0 mLs Intravenous Hold 09/17/21 1708)  0.9 %  sodium chloride infusion ( Intravenous New Bag/Given 09/17/21 1707)  nicotine (NICODERM CQ - dosed in mg/24 hours) patch 21 mg (21 mg Transdermal Patch Applied 09/17/21 1542)  hydrALAZINE (APRESOLINE) injection 10 mg (has no administration in time range)   Chlorhexidine Gluconate Cloth 2 % PADS 6 each (has no administration in  time range)  lactated ringers bolus 1,000 mL (0 mLs Intravenous Stopped 09/17/21 1220)  naloxone (NARCAN) injection 0.4 mg (0.4 mg Intravenous Given by Other 09/17/21 1112)  doxycycline (VIBRAMYCIN) 100 mg in sodium chloride 0.9 % 250 mL IVPB (0 mg Intravenous Stopped 09/17/21 1534)  lactated ringers bolus 1,000 mL (0 mLs Intravenous Stopped 09/17/21 1530)  sodium chloride 0.9 % bolus 2,000 mL (0 mLs Intravenous Stopped 09/17/21 1616)    ED Course/ Medical Decision Making/ A&P                           Medical Decision Making Problems Addressed: Acute kidney injury [facility]): acute illness or injury Acute respiratory failure with hypoxia (HCC): acute illness or injury that poses a threat to life or bodily functions Encephalopathy: acute illness or injury that poses a threat to life or bodily functions Hyperosmolar hyperglycemic state (HHS) (HCC): acute illness or injury that poses a threat to life or bodily functions  Amount and/or Complexity of Data Reviewed Independent Historian: spouse Labs: ordered. Radiology: ordered and independent interpretation performed. ECG/medicine tests: ordered and independent interpretation performed.  Risk Prescription drug management. Decision regarding hospitalization.   Patient with acute AMS. Workup shows this is probably HHS. Glucose over 1500. Mild AKI when comparing labs to baseline. I suspect this is the cause of his encephalopathy. While he is altered and sleepy, he easily awakens and responds, but is confused. Does not need emergent airway management.   ABG shows minimal CO2 elevation, but I don't think he needs bipap.   CXR viewed/interpreted by me, possible PNA but difficult study due to body habitus. Given his immunocompromise and hypoxia, will treat as pneumonia with rocephin and doxycycline (ordered azithro at first but this was stopped right after being started because of  his qtc).   CT head images viewed/interpreted by myself. Questionable area of possible head bleed, would be very small. I discussed with radiologist, likely artifact but needs repeat in a couple hours.   Started on IVF (multiple boluses) as well as insulin drip. Discussed with Dr. Sherene Sires of pulmonary/critical care, would be ok to stay here with hospitalist. Dr. Janee Morn consulted, will admit.  I have updated spouse at bedside with findings.         Final Clinical Impression(s) / ED Diagnoses Final diagnoses:  Hyperosmolar hyperglycemic state (HHS) (HCC)  Encephalopathy  Acute kidney injury (HCC)  Acute respiratory failure with hypoxia [facility])    Rx / DC Orders ED Discharge Orders     None         Pricilla Loveless, MD 09/17/21 1745

## 2021-09-17 NOTE — Assessment & Plan Note (Addendum)
-  Stable. ?-Resume home regimen sertraline 200 g p.o. daily. ?

## 2021-09-17 NOTE — Assessment & Plan Note (Addendum)
-   Patient noted with a history of hypertension on ACE inhibitor. ?-Hold ACE inhibitor secondary to acute kidney injury and consider resuming in the morning if his renal function is improved; okay to resume his lisinopril 40 mg daily ?-Blood pressure stable. ?-Monitor for now, if blood pressure control needed may consider initiation of Norvasc. ?-IV hydralazine as needed. ?-Continue monitor blood pressures per protocol and last blood pressure reading was 148/94 ?

## 2021-09-17 NOTE — Assessment & Plan Note (Addendum)
-  Resume home regimen Zoloft ?

## 2021-09-17 NOTE — ED Notes (Signed)
Pt continues to be confused and need redirection to not pull off mittens.  ?

## 2021-09-17 NOTE — Progress Notes (Signed)
Spoke with Anderson Malta, RN, made aware that PICC would not be placed tonight. Has adequate access at this time. Will continue to monitor. ?

## 2021-09-17 NOTE — Assessment & Plan Note (Addendum)
-  Patient presented to the ED with acute metabolic encephalopathy/altered mental status. ?-No prior history of diabetes and always has been "Pre-Diabetic" per wife but last hemoglobin A1c 6.6 (05/24/2021) ?-Patient on presentation with ABG pH of 7.31, PCO2 of 50, PO2 of 73.  Comprehensive metabolic profile with a potassium of 5.4, sodium of 130, glucose of 1576, BUN of 28, creatinine of 1.53, anion gap of 16.  Lactic acid at 4.8.  Urinalysis with > 500 glucose, ketones negative.  UDS negative. ?-Chest x-ray with concerns for possible infiltrate in the bases.  Beta hydroxybutyric acid elevated at 1.56 and now resolved.  Afebrile. ?-Patient placed on Endo tool which we will continue for now. ?-Given 2 L normal saline bolus.  And then given 3 LR boluses given his lactic acid level still being elevated ?-N.p.o. except ice chips and sips with meds but since he is improved we will advance his diet and now placed on carb modified diet ?-Aggressive IV fluid resuscitation. ?-Check a hemoglobin A1c and is now 12.3. ?-BMET every 4 hours, CBG every hour. ?-IV fluids changed and now have resumed at LR 75 MLS per hour.  Received 2 and half liter boluses yesterday ?-CBGs have been uncontrolled in the setting of prednisone for his scan now and have been ranging from 255-298 ?-His anion gap has closed and he has been transitioned to subcutaneous insulin and have increased to 36 units of Levemir and increased to resistant NovoLog sliding scale insulin AC and at bedtime along with 4 units of NovoLog 3 times daily AC ?-Consulted diabetes education coordinator for further evaluation and recommendations and further insulin teaching ?-Further discussion and evaluation we will send the patient home on Lantus Solostar 50 units subcu daily, 10 units of NovoLog 3 times daily with meals as well as 1 g of Ozempic weekly with a glucometer and close PCP follow-up ?-Supportive care. ?

## 2021-09-17 NOTE — Assessment & Plan Note (Addendum)
-  Patient noted to be following with general surgery in the outpatient setting. Sees [facility] ?-Bariatric surgery recommended as well as weight loss as below. ?-Outpatient follow-up with his primary surgery team at [facility] ?

## 2021-09-17 NOTE — Assessment & Plan Note (Addendum)
-  C/w Aggressive IV fluid resuscitation and IV fluid hydration has now been decreased to LR at 75 MLS per hour and have now stopped ?

## 2021-09-17 NOTE — Assessment & Plan Note (Addendum)
-   Likely secondary to problem #1 of hyperosmolar hyperglycemic state. ?-Initial Head CT with no significant acute abnormalities however area of apparent small, subcentimeter ill-defined hypodensity within the right frontal lobe. ?-Repeat head CT done and showed "Image quality degraded by streak artifact from large patient size.Subtle hyperdensity in the right frontal lobe is less apparent. I doubt this represents acute hemorrhage. This could be chronic hemorrhage due to cavernoma or  calcification. Consider MRI if the patient is able to fit in the MRI machine." - See Vascular Brain Malformation ?-Ammonia levels within normal limits. ?-Patient pancultured and awaiting culture results. ?-Placed on empiric IV antibiotics, aggressive fluid resuscitation, management as in problem #1 hyperosmolar hyperglycemic state. ?-Mentation is improving and the patient appears to be back to his baseline ?

## 2021-09-17 NOTE — Assessment & Plan Note (Addendum)
-  Complicates overall prognosis and care ?-Estimated body mass index is 59.66 kg/m? as calculated from the following: ?  Height as of this encounter: 5\' 9"  (1.753 m). ?  Weight as of this encounter: 183.3 kg.  ?-Weight Loss and Dietary Counseling given ? ?

## 2021-09-17 NOTE — Assessment & Plan Note (Addendum)
-   Patient presented to the ED with altered mental status, tachycardia, tachypnea, lactic acidosis, leukocytosis, noted with HHS. ?-Patient pancultured with blood cultures showed no growth to date at 2 days, urine cultures show 10,000 colony-forming units of Staphylococcus epidermidis.   ?-Chest x-ray with concern for possible bibasilar infiltrates but repeat CXR showed "Degraded exam secondary to patient body habitus and AP portable technique. Cardiomegaly without congestive failure or gross finding in the upper lungs. The lung bases are poorly evaluated." ?-Repeat lactic acid level and trended up to 4.9 but after several fluid boluses has now normalized and is now 1.8 ?-We will discontinue antibiotics as WBC was improving but worsened in the setting of steroid demargination.  WBC trended down from 23.1-15.8 but then bumped up to 16.6 from steroids ?-Clinically do not feel that he has a infection and recommended repeating CBC within 1 week ?-Urinalysis was relatively unremarkable and showed straw-colored urine with greater than 500 glucose and small hemoglobin with negative ketones, negative nitrites and negative leukocytes.  Urine culture showed 10,000 colony-forming units of Staph epidermidis ?-Patient received a dose of IV Rocephin in the ED and will place on IV cefepime empirically pending culture and sensitivities are on revealing we will discontinue. ?-IV fluid hydration has now stopped ?

## 2021-09-17 NOTE — ED Notes (Signed)
Pt confused and will not leave cardiac monitoring alone. Pulling everything off. ?

## 2021-09-18 ENCOUNTER — Inpatient Hospital Stay (HOSPITAL_COMMUNITY): Payer: No Typology Code available for payment source

## 2021-09-18 DIAGNOSIS — Z9081 Acquired absence of spleen: Secondary | ICD-10-CM

## 2021-09-18 DIAGNOSIS — E870 Hyperosmolality and hypernatremia: Secondary | ICD-10-CM

## 2021-09-18 DIAGNOSIS — R7989 Other specified abnormal findings of blood chemistry: Secondary | ICD-10-CM

## 2021-09-18 DIAGNOSIS — E7849 Other hyperlipidemia: Secondary | ICD-10-CM

## 2021-09-18 DIAGNOSIS — Q283 Other malformations of cerebral vessels: Secondary | ICD-10-CM

## 2021-09-18 LAB — BASIC METABOLIC PANEL
Anion gap: 10 (ref 5–15)
Anion gap: 12 (ref 5–15)
Anion gap: 14 (ref 5–15)
Anion gap: 12 (ref 5–15)
BUN: 25 mg/dL — ABNORMAL HIGH (ref 6–20)
BUN: 26 mg/dL — ABNORMAL HIGH (ref 6–20)
BUN: 25 mg/dL — ABNORMAL HIGH (ref 6–20)
BUN: 21 mg/dL — ABNORMAL HIGH (ref 6–20)
CO2: 27 mmol/L (ref 22–32)
CO2: 28 mmol/L (ref 22–32)
CO2: 23 mmol/L (ref 22–32)
CO2: 25 mmol/L (ref 22–32)
Calcium: 10 mg/dL (ref 8.9–10.3)
Calcium: 9.8 mg/dL (ref 8.9–10.3)
Calcium: 9.5 mg/dL (ref 8.9–10.3)
Calcium: 8.7 mg/dL — ABNORMAL LOW (ref 8.9–10.3)
Chloride: 112 mmol/L — ABNORMAL HIGH (ref 98–111)
Chloride: 113 mmol/L — ABNORMAL HIGH (ref 98–111)
Chloride: 114 mmol/L — ABNORMAL HIGH (ref 98–111)
Chloride: 109 mmol/L (ref 98–111)
Creatinine, Ser: 1.42 mg/dL — ABNORMAL HIGH (ref 0.61–1.24)
Creatinine, Ser: 1.53 mg/dL — ABNORMAL HIGH (ref 0.61–1.24)
Creatinine, Ser: 1.45 mg/dL — ABNORMAL HIGH (ref 0.61–1.24)
Creatinine, Ser: 1.13 mg/dL (ref 0.61–1.24)
GFR, Estimated: >60 mL/min (ref >60)
GFR, Estimated: 57 mL/min — ABNORMAL LOW (ref >60)
GFR, Estimated: >60 mL/min (ref >60)
GFR, Estimated: >60 mL/min (ref >60)
Glucose, Bld: 348 mg/dL — ABNORMAL HIGH (ref 70–99)
Glucose, Bld: 268 mg/dL — ABNORMAL HIGH (ref 70–99)
Glucose, Bld: 316 mg/dL — ABNORMAL HIGH (ref 70–99)
Glucose, Bld: 279 mg/dL — ABNORMAL HIGH (ref 70–99)
Potassium: 3.4 mmol/L — ABNORMAL LOW (ref 3.5–5.1)
Potassium: 3.8 mmol/L (ref 3.5–5.1)
Potassium: 3.4 mmol/L — ABNORMAL LOW (ref 3.5–5.1)
Potassium: 3.3 mmol/L — ABNORMAL LOW (ref 3.5–5.1)
Sodium: 149 mmol/L — ABNORMAL HIGH (ref 135–145)
Sodium: 153 mmol/L — ABNORMAL HIGH (ref 135–145)
Sodium: 151 mmol/L — ABNORMAL HIGH (ref 135–145)
Sodium: 146 mmol/L — ABNORMAL HIGH (ref 135–145)

## 2021-09-18 LAB — CBC WITH DIFFERENTIAL/PLATELET
Abs Immature Granulocytes: 0.1 10*3/uL — ABNORMAL HIGH (ref 0.00–0.07)
Basophils Absolute: 0.1 10*3/uL (ref 0.0–0.1)
Basophils Relative: 1 %
Eosinophils Absolute: 0 10*3/uL (ref 0.0–0.5)
Eosinophils Relative: 0 %
HCT: 43.4 % (ref 39.0–52.0)
Hemoglobin: 14.8 g/dL (ref 13.0–17.0)
Immature Granulocytes: 0 %
Lymphocytes Relative: 19 %
Lymphs Abs: 4.4 10*3/uL — ABNORMAL HIGH (ref 0.7–4.0)
MCH: 27.7 pg (ref 26.0–34.0)
MCHC: 34.1 g/dL (ref 30.0–36.0)
MCV: 81.3 fL (ref 80.0–100.0)
Monocytes Absolute: 3.1 10*3/uL — ABNORMAL HIGH (ref 0.1–1.0)
Monocytes Relative: 13 %
Neutro Abs: 15.4 10*3/uL — ABNORMAL HIGH (ref 1.7–7.7)
Neutrophils Relative %: 67 %
Platelets: 406 10*3/uL — ABNORMAL HIGH (ref 150–400)
RBC: 5.34 MIL/uL (ref 4.22–5.81)
RDW: 15.1 % (ref 11.5–15.5)
WBC: 23.1 10*3/uL — ABNORMAL HIGH (ref 4.0–10.5)
nRBC: 0 % (ref 0.0–0.2)

## 2021-09-18 LAB — GLUCOSE, CAPILLARY
Glucose-Capillary: 165 mg/dL — ABNORMAL HIGH (ref 70–99)
Glucose-Capillary: 208 mg/dL — ABNORMAL HIGH (ref 70–99)
Glucose-Capillary: 213 mg/dL — ABNORMAL HIGH (ref 70–99)
Glucose-Capillary: 224 mg/dL — ABNORMAL HIGH (ref 70–99)
Glucose-Capillary: 226 mg/dL — ABNORMAL HIGH (ref 70–99)
Glucose-Capillary: 231 mg/dL — ABNORMAL HIGH (ref 70–99)
Glucose-Capillary: 232 mg/dL — ABNORMAL HIGH (ref 70–99)
Glucose-Capillary: 235 mg/dL — ABNORMAL HIGH (ref 70–99)
Glucose-Capillary: 235 mg/dL — ABNORMAL HIGH (ref 70–99)
Glucose-Capillary: 242 mg/dL — ABNORMAL HIGH (ref 70–99)
Glucose-Capillary: 244 mg/dL — ABNORMAL HIGH (ref 70–99)
Glucose-Capillary: 246 mg/dL — ABNORMAL HIGH (ref 70–99)
Glucose-Capillary: 249 mg/dL — ABNORMAL HIGH (ref 70–99)
Glucose-Capillary: 252 mg/dL — ABNORMAL HIGH (ref 70–99)
Glucose-Capillary: 274 mg/dL — ABNORMAL HIGH (ref 70–99)
Glucose-Capillary: 282 mg/dL — ABNORMAL HIGH (ref 70–99)
Glucose-Capillary: 291 mg/dL — ABNORMAL HIGH (ref 70–99)
Glucose-Capillary: 308 mg/dL — ABNORMAL HIGH (ref 70–99)
Glucose-Capillary: 340 mg/dL — ABNORMAL HIGH (ref 70–99)
Glucose-Capillary: 345 mg/dL — ABNORMAL HIGH (ref 70–99)

## 2021-09-18 LAB — COMPREHENSIVE METABOLIC PANEL
ALT: 49 U/L — ABNORMAL HIGH (ref 0–44)
AST: 71 U/L — ABNORMAL HIGH (ref 15–41)
Albumin: 3.4 g/dL — ABNORMAL LOW (ref 3.5–5.0)
Alkaline Phosphatase: 93 U/L (ref 38–126)
Anion gap: 11 (ref 5–15)
BUN: 25 mg/dL — ABNORMAL HIGH (ref 6–20)
CO2: 28 mmol/L (ref 22–32)
Calcium: 9.5 mg/dL (ref 8.9–10.3)
Chloride: 112 mmol/L — ABNORMAL HIGH (ref 98–111)
Creatinine, Ser: 1.38 mg/dL — ABNORMAL HIGH (ref 0.61–1.24)
GFR, Estimated: >60 mL/min (ref >60)
Glucose, Bld: 285 mg/dL — ABNORMAL HIGH (ref 70–99)
Potassium: 3.7 mmol/L (ref 3.5–5.1)
Sodium: 151 mmol/L — ABNORMAL HIGH (ref 135–145)
Total Bilirubin: 0.6 mg/dL (ref 0.3–1.2)
Total Protein: 7.5 g/dL (ref 6.5–8.1)

## 2021-09-18 LAB — LIPID PANEL
Cholesterol: 216 mg/dL — ABNORMAL HIGH (ref 0–200)
HDL: 34 mg/dL — ABNORMAL LOW (ref >40)
LDL Cholesterol: UNDETERMINED mg/dL (ref 0–99)
Total CHOL/HDL Ratio: 6.4 RATIO
Triglycerides: 429 mg/dL — ABNORMAL HIGH (ref <150)
VLDL: UNDETERMINED mg/dL (ref 0–40)

## 2021-09-18 LAB — LACTIC ACID, PLASMA
Lactic Acid, Venous: 3.2 mmol/L (ref 0.5–1.9)
Lactic Acid, Venous: 4.9 mmol/L (ref 0.5–1.9)
Lactic Acid, Venous: 4.3 mmol/L (ref 0.5–1.9)
Lactic Acid, Venous: 3 mmol/L (ref 0.5–1.9)
Lactic Acid, Venous: 1.8 mmol/L (ref 0.5–1.9)

## 2021-09-18 LAB — LDL CHOLESTEROL, DIRECT: Direct LDL: 129.2 mg/dL — ABNORMAL HIGH (ref 0–99)

## 2021-09-18 LAB — PHOSPHORUS
Phosphorus: 2.6 mg/dL (ref 2.5–4.6)
Phosphorus: 2.8 mg/dL (ref 2.5–4.6)

## 2021-09-18 LAB — MAGNESIUM
Magnesium: 2.5 mg/dL — ABNORMAL HIGH (ref 1.7–2.4)
Magnesium: 2.2 mg/dL (ref 1.7–2.4)

## 2021-09-18 LAB — BETA-HYDROXYBUTYRIC ACID: Beta-Hydroxybutyric Acid: 0.19 mmol/L (ref 0.05–0.27)

## 2021-09-18 MED ORDER — PREDNISONE 20 MG PO TABS: 50.0000 mg | ORAL_TABLET | Freq: Four times a day (QID) | ORAL | Status: DC

## 2021-09-18 MED ORDER — MORPHINE SULFATE (PF) 2 MG/ML IV SOLN
2.0000 mg | INTRAVENOUS | Status: DC | PRN
  Administered 2021-09-18: 2 mg via INTRAVENOUS
  Filled 2021-09-18: qty 1

## 2021-09-18 MED ORDER — INSULIN ASPART 100 UNIT/ML IJ SOLN
0.0000 [IU] | Freq: Three times a day (TID) | INTRAMUSCULAR | Status: DC
  Administered 2021-09-18: 5 [IU] via SUBCUTANEOUS

## 2021-09-18 MED ORDER — LACTATED RINGERS IV BOLUS
500.0000 mL | Freq: Once | INTRAVENOUS | Status: AC
  Administered 2021-09-18: 500 mL via INTRAVENOUS

## 2021-09-18 MED ORDER — LIVING WELL WITH DIABETES BOOK: Freq: Once | Status: AC

## 2021-09-18 MED ORDER — LACTATED RINGERS IV BOLUS
1000.0000 mL | Freq: Once | INTRAVENOUS | Status: AC
  Administered 2021-09-18: 1000 mL via INTRAVENOUS

## 2021-09-18 MED ORDER — DIPHENHYDRAMINE HCL 25 MG PO CAPS: 50.0000 mg | ORAL_CAPSULE | Freq: Once | ORAL | Status: AC

## 2021-09-18 MED ORDER — SODIUM CHLORIDE 0.9 % IV SOLN: INTRAVENOUS | Status: AC

## 2021-09-18 MED ORDER — POTASSIUM CHLORIDE 10 MEQ/100ML IV SOLN
10.0000 meq | INTRAVENOUS | Status: AC
  Administered 2021-09-18 (×4): 10 meq via INTRAVENOUS
  Filled 2021-09-18 (×4): qty 100

## 2021-09-18 MED ORDER — INSULIN ASPART 100 UNIT/ML IJ SOLN
0.0000 [IU] | Freq: Every day | INTRAMUSCULAR | Status: DC
  Administered 2021-09-18: 4 [IU] via SUBCUTANEOUS

## 2021-09-18 MED ORDER — DIPHENHYDRAMINE HCL 50 MG/ML IJ SOLN
50.0000 mg | Freq: Once | INTRAMUSCULAR | Status: AC
  Administered 2021-09-19: 50 mg via INTRAVENOUS
  Filled 2021-09-18: qty 1

## 2021-09-18 MED ORDER — DIPHENHYDRAMINE HCL 25 MG PO CAPS: 50.0000 mg | ORAL_CAPSULE | Freq: Once | ORAL | Status: DC

## 2021-09-18 MED ORDER — KCL-LACTATED RINGERS-D5W 20 MEQ/L IV SOLN
INTRAVENOUS | Status: DC
Start: 2021-09-18 — End: 2021-09-18
  Filled 2021-09-18 (×9): qty 1000

## 2021-09-18 MED ORDER — INSULIN STARTER KIT- PEN NEEDLES (ENGLISH)
1.0000 | Freq: Once | Status: AC
  Administered 2021-09-19: 1
  Filled 2021-09-18: qty 1

## 2021-09-18 MED ORDER — DIPHENHYDRAMINE HCL 50 MG/ML IJ SOLN: 50.0000 mg | Freq: Once | INTRAMUSCULAR | Status: DC

## 2021-09-18 MED ORDER — POTASSIUM CHLORIDE 10 MEQ/100ML IV SOLN: 10.0000 meq | INTRAVENOUS | Status: DC

## 2021-09-18 MED ORDER — SODIUM CHLORIDE 0.9% FLUSH: 10.0000 mL | INTRAVENOUS | Status: DC | PRN

## 2021-09-18 MED ORDER — SODIUM CHLORIDE 0.9% FLUSH
10.0000 mL | Freq: Two times a day (BID) | INTRAVENOUS | Status: DC
  Administered 2021-09-18 – 2021-09-20 (×2): 10 mL

## 2021-09-18 MED ORDER — INSULIN GLARGINE-YFGN 100 UNIT/ML ~~LOC~~ SOLN
18.0000 [IU] | Freq: Every day | SUBCUTANEOUS | Status: DC
  Administered 2021-09-18: 18 [IU] via SUBCUTANEOUS
  Filled 2021-09-18 (×3): qty 0.18

## 2021-09-18 MED ORDER — INSULIN ASPART 100 UNIT/ML IJ SOLN
4.0000 [IU] | Freq: Three times a day (TID) | INTRAMUSCULAR | Status: DC
  Administered 2021-09-19 – 2021-09-20 (×5): 4 [IU] via SUBCUTANEOUS

## 2021-09-18 MED ORDER — LACTATED RINGERS IV SOLN: INTRAVENOUS | Status: DC

## 2021-09-18 MED ORDER — PREDNISONE 20 MG PO TABS
50.0000 mg | ORAL_TABLET | Freq: Four times a day (QID) | ORAL | Status: AC
  Administered 2021-09-18 – 2021-09-19 (×3): 50 mg via ORAL
  Filled 2021-09-18 (×3): qty 1

## 2021-09-18 MED ORDER — DIPHENHYDRAMINE HCL 50 MG/ML IJ SOLN
25.0000 mg | Freq: Once | INTRAMUSCULAR | Status: AC
  Administered 2021-09-18: 25 mg via INTRAVENOUS
  Filled 2021-09-18: qty 1

## 2021-09-18 NOTE — Assessment & Plan Note (Addendum)
-  Initial Head CT scan done and showed "Apparent small, subcentimeter ill-defined hyperdensity within the right frontal lobe (series 3, image 21). This could potentially represent a small amount of acute hemorrhage versus artifact given streak artifact in this region. No appreciable edema or mass effect. Short interval follow-up CT is recommended to assess for persistence." ?-Repeat Head CT Scan done and showed "Image quality degraded by streak artifact from large patient size. Subtle hyperdensity in the right frontal lobe is less apparent. I doubt this represents acute hemorrhage. This could be chronic hemorrhage due to cavernoma or calcification. Consider MRI if the patient is able to fit in the MRI machine." ?-I discussed the case with neurology Dr. Wilford Corner who feels that the patient would benefit from an MRI with outpatient follow-up however given the patient's body habitus likely will not from the MRI scanner so Dr. Wilford Corner is recommending a CTA of the head with contrast delay and the contrast protocol given that he has a contrast allergy.   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 No detectable arterial lesion. Incidental polyp in the right nasal cavity. Degraded CTA due to body habitus." ?-Outpatient follow-up with neurology indicated if he only has symptoms and his headaches or seizures; Defer to PCP to obtain MRI ?? ? ?? ?

## 2021-09-18 NOTE — Hospital Course (Addendum)
HPI by Dr. Ramiro Harvest on 09/17/21 ?Sean Murillo is a 46 y.o. male with medical history significant of hypertension, morbid obesity, hyperlipidemia, depression/anxiety, status post splenectomy, PTSD, chronic low back pain presented to the ED with altered mental status.  At time of interview patient unable to answer questions appropriately or reliably and as such history obtained per ED physician.  No family at bedside.  It is noted per ED physician that patient with altered mental status.  It is noted that patient having diarrhea about 4 to 5 days ago and over the weekend developed some emesis.  Patient noted to have developed some increased thirst with no prior history of diabetes.  It is noted that patient woke up on the morning of admission and was altered.  No fevers, no cough.  It is reported per EMS that patient noted to have sats of 87% on room air patient placed on 6 L nasal cannula and brought to the ED.  Initial CBG reading in the ED was high.  Patient states no when asked review of systems questions. ?ED Course: Patient seen in the ED noted to have significant altered mental status however protecting airway, pulling out IVs.  Vital signs on presentation noted a blood pressure of 120/72, tachycardic with heart rate as high as 133, tachypneic with a respiratory rate as high as 35, noted to have 87 6% sats on room air initially placed on 6 L nasal cannula currently on 4 L nasal cannula with sats of 94%.  ABG obtained with a pH of 7.31/PCO2 of 50/PO2 of 73.  Comprehensive metabolic profile with a sodium of 130, potassium of 5.4, chloride of 91, glucose of 1576, BUN of 28, creatinine of 1.53, calcium of 10.4, magnesium of 2.7, anion gap of 16, ALT of 59, protein of 8.5 otherwise was within normal limits.  Initial lactic acid level of 4.8 with repeat at 3.9.  CBC with a white count of 11.1 with a ANC of 9.3 otherwise was within normal limits.  INR 1.1.  Glucose of 1576.  Beta hydroxybutyric acid of 1.56.   COVID-19 PCR negative.  Influenza A and B PCR negative.  Urinalysis clear, straw-colored, glucose > 500, nitrite negative, leukocytes negative, protein negative, specific gravity 1.028.  No bacteria seen.  UDS negative.  Chest x-ray with cardiomegaly, no signs of pulmonary edema.  Increased density in both lower lung fields may be related to chest wall attenuation of pleural effusions and underlying infiltrates.  CT head with apparent small, subcentimeter ill-defined hypodensity within the right frontal lobe.  Could potentially represent a small amount of acute hemorrhage versus artifact given streak artifact in this region.  No appreciable edema or mass effect.  Patient pancultured.  Patient given a dose of IV Rocephin.  Patient placed on the Endo tool, IV fluids, supportive care.  Repeat head CT pending for 1500 hrs. per ED physician. ?  ?**Interim History ?Patient's mental status is improving slowly and he is awake and alert and oriented.  His renal function remains elevated he was given 2 more liter lactated ringer boluses along with a 500 mL given his elevated lactic acid.  Lactic acid trended up to 4.9 is now improved and resolved at 1.8.  We will continue IV fluid hydration but change from the D5 LR to just LR at 75 MLS per hour.  Patient was transitioned off the insulin drip and he was initiated on 18 units of Semglee but this has been uptitrated to 36 units.  Renal  ultrasound has been normal.  The patient is to undergo a CTA of the head for his vascular malformation noted after discussion with the neurology team yesterday. ? ?He underwent the CTA of his head and showed a faint right frontal lobe enhancement at the site of initial high density by CT likely developmental venous anomaly/cavernoma with no detectable arterial lesion but an incidental polyp in the right nasal cavity noted. ? ?Patient subsequently improved and his blood sugars are improving.  After further discussion with the diabetic coordinator  he will be discharged home on Lantus 50 units subcu daily, 10 units of NovoLog 3 times daily with meals, and Ozempic 1 weekly.  He did did have some abnormal LFTs prior to discharge however this was worked up with a right upper quadrant ultrasound which showed some hepatic steatosis.  Likely his LFTs worsened in the setting of steroids.  Recommending following up within 1 week and repeating a CMP.  He is stable for discharge at this time and ambulated well with therapy without any issues ?

## 2021-09-18 NOTE — Progress Notes (Signed)
Initial Nutrition Assessment ? ?DOCUMENTATION CODES:  ? ?  ? ?INTERVENTION:  ?Diabetes Plate Method -Handout  ? ?Provided list of credible web sources for additional recipe ideas and education ? ?NUTRITION DIAGNOSIS:  ? ?Food and nutrition related knowledge deficit related to limited prior education (newly diagnosed diabetes) as evidenced by per patient/family report (A1C-12.3%). ? ? ?GOAL:  ? (Patient will begin to modify meals to adopt a CHO modifed diet) ? ? ?MONITOR:  ?PO intake, Skin, Weight trends, Labs ? ?REASON FOR ASSESSMENT:  ? ?Consult ?Diet education ? ?ASSESSMENT: Patient is a 46 yo male newly diagnosed Diabetes. History of morbid obesity, HTN, HLD, altered mental status and chronic non-healing abdominal wound (site of hernia repair).   ? ?Patient diet advanced CHO modified. His wife is bedside and his daughter [place] is on the telephone. Reviewed the basics: list of simple/ complex carbohydrate foods, emphasized portion control and moderation. Encouraged lean sources of protein and heart healthy fats (olive oil) for cooking.  ? ?Patient is talking about pursuing bariatric surgery. If so he would be likely followed by an outpatient dietitian which would be helpful for them. His wife expressed feeling overwhelmed with the new lifestyle/diet change. ?  ?Provided RD contact and encouraged her to call with further questions. Recommend they follow up with [facility] if insurance will allow. Weight loss desirable.  ? ?Meds and weights reviewed.  ? ?Labs: 3/21) A1C- 12.3%, Triglycerides 429 (H)  ? ?  Latest Ref Rng & Units 09/18/2021  ? 12:44 PM 09/18/2021  ?  8:07 AM 09/18/2021  ?  4:57 AM  ?BMP  ?Glucose 70 - 99 mg/dL 245   809   983    ?BUN 6 - 20 mg/dL 25   25   26     ?Creatinine 0.61 - 1.24 mg/dL   3.82   5.05    ?Sodium 135 - 145 mmol/L 151   151   153    ?Potassium 3.5 - 5.1 mmol/L 3.7   3.4   3.8    ?Chloride 98 - 111 mmol/L 112   114   113    ?CO2 22 - 32 mmol/L 28   23   28     ?Calcium 8.9 - 10.3 mg/dL  9.5   9.5   9.8    ?   ? ?NUTRITION - FOCUSED PHYSICAL EXAM: ?Limited exam- reports to be bedbound at baseline ? ? ?Diet Order:   ?Diet Order   ? ?       ?  Diet Carb Modified Fluid consistency: Thin; Room service appropriate? Yes  Diet effective now       ?  ? ?  ?  ? ?  ? ? ?EDUCATION NEEDS:  ?Education needs have been addressed ? ?Skin:  Skin Assessment: Skin Integrity Issues: ?Skin Integrity Issues:: Other (Comment) ?Other: non-healing wound -site of hernia repair per WOC ? ?Last BM:  3/21 ? ?Height:  ? ?Ht Readings from Last 1 Encounters:  ?09/17/21 5\' 9"  (1.753 m)  ? ? ?Weight:  ? ?Wt Readings from Last 1 Encounters:  ?09/18/21 (!) 183.3 kg  ? ? ?Ideal Body Weight:   73 kg ? ?BMI:  Body mass index is 59.66 kg/m?. ? ?Estimated Nutritional Needs:  ? ?Kcal:  2715 (reduce 500-800 kcal daily-to promote wt loss) ? ?Protein:  135-142 gr ? ?Fluid:  >2 liters daily ? ? MS,RD,CSG,LDN ?Contact: [facility]ION ?

## 2021-09-18 NOTE — Consult Note (Signed)
WOC Nurse Consult Note: ?Reason for Consult:chronic, nonhealing wound to abdomen at site of hernia repair, also ITD ?Wound type:Moisture, delayed healing ?Pressure Injury POA: N/A ?Measurement: 0.4cm round x 0.2cm ?Wound bed:red, moist ?Drainage (amount, consistency, odor) small serous ?Periwound: with evidence of wound healing, scarring ?Dressing procedure/placement/frequency: ?Patient is on a mattress replacement for bariatric patients with low air loss feature. Topical care to the area of chronic nonhealing is to be daily with xeroform gauze topped with silicone foam.  Heels are to be floated and patient turned from side to side. A sacral foam is placed for PI prevention. ? ?[facility] nursing team will not follow, but will remain available to this patient, the nursing and medical teams.  Please re-consult if needed. ?Thanks, ?Maudie Flakes, MSN, RN, [place], [place], CWON-AP, [place]  ?Pager# [phone]  ? ? ?  ?

## 2021-09-18 NOTE — Progress Notes (Addendum)
ON CALL PHONE CONSULT ? ?Call from [email or web address]. [email or web address] ? ? ?Discussion: Patient with  HHS, head CT done for AMS eval - came back with questionable vascular malformation finding on noncontrast study.  Ideally would like MRI brain with and without contrast but his body habitus of weight over 183 kg and height of 5 foot 9 inches with a BMI of 59.66 precludes safe placement in the MRI scanner.  I have ordered a CT angio head and neck with a 5-minute delay of the head to evaluate for any emergent vascular finding which might need neurosurgical intervention.  He is also allergic to contrast and has no localizing signs so I have ordered the long prep for contrast allergy. ?Once the CTA is completed, please call the on-call with questions as needed. ? ?-- ?Fabiola Backer, MD ?Neurologist ?[facility] ?Pager: [phone] ? ? ?No charge note ? ?

## 2021-09-18 NOTE — Progress Notes (Signed)
[facility] notified of pts admission to hospital. [facility] notification ID is [id number].  ?

## 2021-09-18 NOTE — Progress Notes (Signed)
Date and time results received: 09/18/21 0857 ?(use smartphrase ".now" to insert current time) ? ?Test: Lactic Acid ?Critical Value: 4.9 ? ?Name of Provider Notified: Dr Marland Mcalpine, Heloise Beecham ? ?Orders Received? Or Actions Taken?: Orders Received - See Orders for details ?

## 2021-09-18 NOTE — Plan of Care (Signed)

## 2021-09-18 NOTE — Progress Notes (Signed)
Consent for PICC insertion obtained at bedside from wife Tzvi Pott). Attempted PICC Insertion x2 with no success. Unable to place PICC due to body habitus. However, able to place midline. If PICC still needed, would refer to IR for placement. Dr. Alfredia Ferguson notified. Patient currently now has 1 PIV and Midline, which should be suitable for current meds ordered. Potential to come off of insulin gtt.  ?

## 2021-09-18 NOTE — Assessment & Plan Note (Addendum)
-  Patient's LFTs have worsened in the setting of hypoperfusion and likely steroids ?-Patient's AST went from 38 and trended up to 71 but is now trending back down is now 59 yesterday but bumped to 93 today and ALT went from 59 is now 82   45 ?-Continue to monitor trend renal function carefully and obtain a right upper quadrant ultrasound which showed hepatic steatosis and fatty liver disease so we will need dietary modification ?-Recommending following up CMP within 1 week and trending LFTs and if continues to be elevated to have further work-up per PCP ? ?

## 2021-09-18 NOTE — Progress Notes (Signed)
?PROGRESS NOTE ? ? ? ?Sean Murillo  YTK:[id number] DOB: Jun 22, 1976 DOA: 09/17/2021 ?PCP: Avis Epley, PA-C  ? ?Brief Narrative:  ?HPI by Dr. Ramiro Harvest on 09/17/21 ?Sean Murillo is a 46 y.o. male with medical history significant of hypertension, morbid obesity, hyperlipidemia, depression/anxiety, status post splenectomy, PTSD, chronic low back pain presented to the ED with altered mental status.  At time of interview patient unable to answer questions appropriately or reliably and as such history obtained per ED physician.  No family at bedside.  It is noted per ED physician that patient with altered mental status.  It is noted that patient having diarrhea about 4 to 5 days ago and over the weekend developed some emesis.  Patient noted to have developed some increased thirst with no prior history of diabetes.  It is noted that patient woke up on the morning of admission and was altered.  No fevers, no cough.  It is reported per EMS that patient noted to have sats of 87% on room air patient placed on 6 L nasal cannula and brought to the ED.  Initial CBG reading in the ED was high.  Patient states no when asked review of systems questions. ?ED Course: Patient seen in the ED noted to have significant altered mental status however protecting airway, pulling out IVs.  Vital signs on presentation noted a blood pressure of 120/72, tachycardic with heart rate as high as 133, tachypneic with a respiratory rate as high as 35, noted to have 87 6% sats on room air initially placed on 6 L nasal cannula currently on 4 L nasal cannula with sats of 94%.  ABG obtained with a pH of 7.31/PCO2 of 50/PO2 of 73.  Comprehensive metabolic profile with a sodium of 130, potassium of 5.4, chloride of 91, glucose of 1576, BUN of 28, creatinine of 1.53, calcium of 10.4, magnesium of 2.7, anion gap of 16, ALT of 59, protein of 8.5 otherwise was within normal limits.  Initial lactic acid level of 4.8 with repeat at 3.9.  CBC with  a white count of 11.1 with a ANC of 9.3 otherwise was within normal limits.  INR 1.1.  Glucose of 1576.  Beta hydroxybutyric acid of 1.56.  COVID-19 PCR negative.  Influenza A and B PCR negative.  Urinalysis clear, straw-colored, glucose > 500, nitrite negative, leukocytes negative, protein negative, specific gravity 1.028.  No bacteria seen.  UDS negative.  Chest x-ray with cardiomegaly, no signs of pulmonary edema.  Increased density in both lower lung fields may be related to chest wall attenuation of pleural effusions and underlying infiltrates.  CT head with apparent small, subcentimeter ill-defined hypodensity within the right frontal lobe.  Could potentially represent a small amount of acute hemorrhage versus artifact given streak artifact in this region.  No appreciable edema or mass effect.  Patient pancultured.  Patient given a dose of IV Rocephin.  Patient placed on the Endo tool, IV fluids, supportive care.  Repeat head CT pending for 1500 hrs. per ED physician. ?  ?**Interim History ?Patient's mental status is improved slowly and he is awake and alert and oriented.  His renal function remains elevated he was given 2 more lactated ringer boluses given his elevated lactic acid.  Lactic acid trended up to 4.9 is now trended down to 4.3.  We will continue IV fluid hydration with D5 LR at 125 MLS per hour.  We will transition the patient off of his insulin drip to long-acting Semglee 18 units daily.  Patient now has a hyponatremia.  Patient's renal ultrasound is normal.  CBGs ranging from 224-244.  ? ? ?Assessment and Plan: ?* Hyperosmolar hyperglycemic state (HHS) (HCC) ?-Patient presented to the ED with acute metabolic encephalopathy/altered mental status. ?-No prior history of diabetes and always has been "Pre-Diabetic" per wife but last hemoglobin A1c 6.6 (05/24/2021) ?-Patient on presentation with ABG pH of 7.31, PCO2 of 50, PO2 of 73.  Comprehensive metabolic profile with a potassium of 5.4, sodium of  130, glucose of 1576, BUN of 28, creatinine of 1.53, anion gap of 16.  Lactic acid at 4.8.  Urinalysis with > 500 glucose, ketones negative.  UDS negative. ?-Chest x-ray with concerns for possible infiltrate in the bases.  Beta hydroxybutyric acid elevated at 1.56 and now resolved.  Afebrile. ?-Patient placed on Endo tool which we will continue for now. ?-Given 2 L normal saline bolus.  And then given 3 LR boluses given his lactic acid level still being elevated ?-N.p.o. except ice chips and sips with meds but since he is improved we will advance his diet and now placed on carb modified diet ?-Aggressive IV fluid resuscitation. ?-Check a hemoglobin A1c and is now 12.3. ?-BMET every 4 hours, CBG every hour. ?-After bolus placed on normal saline at 150 cc an hour. ?-Once CBGs are consistently < 250 change IV fluids to D5 LR at 125 cc an hour.;  Blood sugars have improved and now is on D5 LR at 125 cc/h and will transition off of Endo tool ?-Once anion gap is closed, blood glucose levels improved could transition to subcutaneous insulin. ?-Consulted diabetes education coordinator for further evaluation and recommendations ?-Supportive care. ? ?SIRS (systemic inflammatory response syndrome) (HCC) ?- Patient presented to the ED with altered mental status, tachycardia, tachypnea, lactic acidosis, leukocytosis, noted with HHS. ?-Patient pancultured with blood cultures pending, urine cultures pending.   ?-Chest x-ray with concern for possible bibasilar infiltrates but repeat CXR showed "Degraded exam secondary to patient body habitus and AP portable technique. Cardiomegaly without congestive failure or gross finding in the upper lungs. The lung bases are poorly evaluated." ?-Repeat lactic acid level and trended up to 4.9 and is now 4.3. ?-Urinalysis was relatively unremarkable and showed straw-colored urine with greater than 500 glucose and small hemoglobin with negative ketones, negative nitrites and negative leukocytes.   Urine culture showed 10,000 colony-forming units of Staph epidermidis ?-Patient received a dose of IV Rocephin in the ED and will place on IV cefepime empirically pending culture results. ?-C/w IV fluids with D5 LR + 20 mEQ, supportive care. ? ?Acute metabolic encephalopathy ?- Likely secondary to problem #1 of hyperosmolar hyperglycemic state. ?-Initial Head CT with no significant acute abnormalities however area of apparent small, subcentimeter ill-defined hypodensity within the right frontal lobe. ?-Repeat head CT done and showed "Image quality degraded by streak artifact from large patient size.Subtle hyperdensity in the right frontal lobe is less apparent. I doubt this represents acute hemorrhage. This could be chronic hemorrhage due to cavernoma or  calcification. Consider MRI if the patient is able to fit in the MRI machine." ?-Ammonia levels within normal limits. ?-Patient pancultured and awaiting culture results. ?-Placed on empiric IV antibiotics, aggressive fluid resuscitation, management as in problem #1 hyperosmolar hyperglycemic state. ?-Mentation is improving  ?-May discuss with Neurology  ? ?AKI (acute kidney injury) (HCC) ?- Likely secondary to a prerenal azotemia in the setting of GI losses as per ED physicians note patient noted to have had some bouts of diarrhea and  emesis prior to admission. ?-Last creatinine noted of 0.74 on 05/24/2021. ?-Urinalysis with nitrite negative, leukocytes negative, protein negative. ?-Hydrate aggressively with IV fluids. ?-Strict I's and O's, daily weights. ?-If no improvement in the next 24 hours we will get a renal ultrasound. ?-Renal ultrasound done and showed "Normal kidneys.  No hydronephrosis. Bladder not identified.  Morbid obesity.  Foley catheter" ?-Avoid further nephrotoxic medications, consciousness, hypotension and renally dose medications ?-Continue to Follow Renal Fxn closely  ? ?Hyperkalemia ?-Likely secondary to hyperosmolar hyperglycemic state. ?-Now  his potassium level was 3.7 on last check ?-Continue aggressive IV fluid resuscitation and Endo tool. ?-Follow. ?-If worsening may need Lokelma. ? ?Abnormal LFTs ?-Patient's LFTs have worsened in th

## 2021-09-18 NOTE — Assessment & Plan Note (Addendum)
-  Patient's Na+ has been elevated and in the setting of NS and Dehydration ?-Na+ went from 139 -> 146 -> 148 -> 149 -> 153 -> 151 -> 151 and trended down to 146 the day before yesterday and yesterday was 140 and today is 138 ?-We will discontinue IV fluid hydration ?Continue to monitor and trend and repeat CMP within 1 week ?

## 2021-09-18 NOTE — Progress Notes (Signed)
Inpatient Diabetes Program Recommendations ? ?AACE/ADA: New Consensus Statement on Inpatient Glycemic Control (2015) ? ?Target Ranges:  Prepandial:   less than 140 mg/dL ?     Peak postprandial:   less than 180 mg/dL (1-2 hours) ?     Critically ill patients:  140 - 180 mg/dL  ? ?Lab Results  ?Component Value Date  ? GLUCAP 308 (H) 09/18/2021  ? HGBA1C 12.3 (H) 09/17/2021  ? ? Latest Reference Range & Units 09/18/21 04:57  ?Total CHOL/HDL Ratio RATIO 6.4  ?Cholesterol 0 - 200 mg/dL 216 (H)  ?HDL Cholesterol >40 mg/dL 34 (L)  ?LDL (calc) 0 - 99 mg/dL UNABLE TO CALCULATE IF TRIGLYCERIDE OVER 400 mg/dL  ?Triglycerides <150 mg/dL 429 (H)  ?VLDL 0 - 40 mg/dL UNABLE TO CALCULATE IF TRIGLYCERIDE OVER 400 mg/dL  ? ? Latest Reference Range & Units 09/18/21 08:07  ?Sodium 135 - 145 mmol/L 151 (H)  ?Potassium 3.5 - 5.1 mmol/L 3.4 (L)  ?Chloride 98 - 111 mmol/L 114 (H)  ?CO2 22 - 32 mmol/L 23  ?Glucose 70 - 99 mg/dL 316 (H)  ?BUN 6 - 20 mg/dL 25 (H)  ?Creatinine 0.61 - 1.24 mg/dL 1.45 (H)  ?Calcium 8.9 - 10.3 mg/dL 9.5  ?Anion gap 5 - 15  14  ? ?Diabetes history: No prior hx DM ?Current orders for Inpatient glycemic control: IV insulin ? ?Inpatient Diabetes Program Recommendations:   ?Noted patient new onset on IV insulin requiring high rates of insulin.  Triglycerides 429. ?When ready to transition to subcutaneous insulin, give basal insulin 2 hrs. Prior to IV insulin discontinued and cover CBG with Novolog correction when IV insulin discontinued. ?Ordered Living Well with diabetes booklet, insulin pen starter kit, and dietician consult. ?Will follow during hospitalization and speak with patient when appropriate. ? ?Thank you, ?Nani Gasser Honesti Seaberg, RN, MSN, CDE  ?Diabetes Coordinator ?Inpatient Glycemic Control Team ?Team Pager [phone] (8am-5pm) ?09/18/2021 9:19 AM ? ? ? ?

## 2021-09-19 ENCOUNTER — Inpatient Hospital Stay (HOSPITAL_COMMUNITY): Payer: No Typology Code available for payment source

## 2021-09-19 DIAGNOSIS — E8809 Other disorders of plasma-protein metabolism, not elsewhere classified: Secondary | ICD-10-CM

## 2021-09-19 DIAGNOSIS — D72829 Elevated white blood cell count, unspecified: Secondary | ICD-10-CM

## 2021-09-19 LAB — COMPREHENSIVE METABOLIC PANEL
ALT: 45 U/L — ABNORMAL HIGH (ref 0–44)
AST: 57 U/L — ABNORMAL HIGH (ref 15–41)
Albumin: 2.8 g/dL — ABNORMAL LOW (ref 3.5–5.0)
Alkaline Phosphatase: 82 U/L (ref 38–126)
Anion gap: 11 (ref 5–15)
BUN: 21 mg/dL — ABNORMAL HIGH (ref 6–20)
CO2: 22 mmol/L (ref 22–32)
Calcium: 8.2 mg/dL — ABNORMAL LOW (ref 8.9–10.3)
Chloride: 107 mmol/L (ref 98–111)
Creatinine, Ser: 0.99 mg/dL (ref 0.61–1.24)
GFR, Estimated: >60 mL/min (ref >60)
Glucose, Bld: 503 mg/dL (ref 70–99)
Potassium: 4.8 mmol/L (ref 3.5–5.1)
Sodium: 140 mmol/L (ref 135–145)
Total Bilirubin: 1.1 mg/dL (ref 0.3–1.2)
Total Protein: 6.5 g/dL (ref 6.5–8.1)

## 2021-09-19 LAB — GLUCOSE, CAPILLARY
Glucose-Capillary: 301 mg/dL — ABNORMAL HIGH (ref 70–99)
Glucose-Capillary: 365 mg/dL — ABNORMAL HIGH (ref 70–99)
Glucose-Capillary: 382 mg/dL — ABNORMAL HIGH (ref 70–99)
Glucose-Capillary: 455 mg/dL — ABNORMAL HIGH (ref 70–99)
Glucose-Capillary: 459 mg/dL — ABNORMAL HIGH (ref 70–99)
Glucose-Capillary: 480 mg/dL — ABNORMAL HIGH (ref 70–99)
Glucose-Capillary: 526 mg/dL (ref 70–99)

## 2021-09-19 LAB — CBC WITH DIFFERENTIAL/PLATELET
Abs Immature Granulocytes: 0.07 10*3/uL (ref 0.00–0.07)
Basophils Absolute: 0.1 10*3/uL (ref 0.0–0.1)
Basophils Relative: 1 %
Eosinophils Absolute: 0 10*3/uL (ref 0.0–0.5)
Eosinophils Relative: 0 %
HCT: 38.8 % — ABNORMAL LOW (ref 39.0–52.0)
Hemoglobin: 13.1 g/dL (ref 13.0–17.0)
Immature Granulocytes: 1 %
Lymphocytes Relative: 21 %
Lymphs Abs: 3.3 10*3/uL (ref 0.7–4.0)
MCH: 27.6 pg (ref 26.0–34.0)
MCHC: 33.8 g/dL (ref 30.0–36.0)
MCV: 81.7 fL (ref 80.0–100.0)
Monocytes Absolute: 0.8 10*3/uL (ref 0.1–1.0)
Monocytes Relative: 5 %
Neutro Abs: 11.1 10*3/uL — ABNORMAL HIGH (ref 1.7–7.7)
Neutrophils Relative %: 72 %
Platelets: 320 10*3/uL (ref 150–400)
RBC: 4.75 MIL/uL (ref 4.22–5.81)
RDW: 15.7 % — ABNORMAL HIGH (ref 11.5–15.5)
WBC: 15.3 10*3/uL — ABNORMAL HIGH (ref 4.0–10.5)
nRBC: 0 % (ref 0.0–0.2)

## 2021-09-19 LAB — URINE CULTURE: Culture: 10000 — AB

## 2021-09-19 LAB — PHOSPHORUS: Phosphorus: 3 mg/dL (ref 2.5–4.6)

## 2021-09-19 LAB — MAGNESIUM: Magnesium: 2.1 mg/dL (ref 1.7–2.4)

## 2021-09-19 MED ORDER — INSULIN GLARGINE-YFGN 100 UNIT/ML ~~LOC~~ SOLN
36.0000 [IU] | Freq: Every day | SUBCUTANEOUS | Status: DC
  Administered 2021-09-19 – 2021-09-20 (×2): 36 [IU] via SUBCUTANEOUS
  Filled 2021-09-19 (×4): qty 0.36

## 2021-09-19 MED ORDER — INSULIN ASPART 100 UNIT/ML IJ SOLN
10.0000 [IU] | Freq: Once | INTRAMUSCULAR | Status: AC
  Administered 2021-09-19: 10 [IU] via SUBCUTANEOUS

## 2021-09-19 MED ORDER — PNEUMOCOCCAL VAC POLYVALENT 25 MCG/0.5ML IJ INJ
0.5000 mL | INJECTION | INTRAMUSCULAR | Status: DC
  Filled 2021-09-19: qty 0.5

## 2021-09-19 MED ORDER — INSULIN ASPART 100 UNIT/ML IJ SOLN
18.0000 [IU] | Freq: Once | INTRAMUSCULAR | Status: AC
  Administered 2021-09-19: 18 [IU] via SUBCUTANEOUS

## 2021-09-19 MED ORDER — INSULIN GLARGINE-YFGN 100 UNIT/ML ~~LOC~~ SOLN
25.0000 [IU] | Freq: Every day | SUBCUTANEOUS | Status: DC
Start: 2021-09-19 — End: 2021-09-19
  Filled 2021-09-19 (×2): qty 0.25

## 2021-09-19 MED ORDER — LACTATED RINGERS IV SOLN: INTRAVENOUS | Status: DC

## 2021-09-19 MED ORDER — INSULIN ASPART 100 UNIT/ML IJ SOLN
0.0000 [IU] | Freq: Three times a day (TID) | INTRAMUSCULAR | Status: DC
  Administered 2021-09-19: 20 [IU] via SUBCUTANEOUS

## 2021-09-19 MED ORDER — INSULIN ASPART 100 UNIT/ML IJ SOLN: 0.0000 [IU] | Freq: Every day | INTRAMUSCULAR | Status: DC

## 2021-09-19 MED ORDER — INSULIN ASPART 100 UNIT/ML IJ SOLN
0.0000 [IU] | INTRAMUSCULAR | Status: DC
  Administered 2021-09-19 (×2): 20 [IU] via SUBCUTANEOUS
  Administered 2021-09-20: 11 [IU] via SUBCUTANEOUS
  Administered 2021-09-20: 15 [IU] via SUBCUTANEOUS
  Administered 2021-09-20 (×2): 11 [IU] via SUBCUTANEOUS

## 2021-09-19 MED ORDER — IOHEXOL 350 MG/ML SOLN
75.0000 mL | Freq: Once | INTRAVENOUS | Status: AC | PRN
Start: 2021-09-19 — End: 2021-09-19
  Administered 2021-09-19: 75 mL via INTRAVENOUS

## 2021-09-19 NOTE — Assessment & Plan Note (Addendum)
-   Patient's albumin level dropped and went from 3.8 is now 2.8 x2 ?-We will consult nutrition for further evaluation ?

## 2021-09-19 NOTE — Progress Notes (Signed)
Inpatient Diabetes Program Recommendations ? ?AACE/ADA: New Consensus Statement on Inpatient Glycemic Control (2015) ? ?Target Ranges:  Prepandial:   less than 140 mg/dL ?     Peak postprandial:   less than 180 mg/dL (1-2 hours) ?     Critically ill patients:  140 - 180 mg/dL  ? ?Lab Results  ?Component Value Date  ? GLUCAP 459 (H) 09/19/2021  ? HGBA1C 12.3 (H) 09/17/2021  ? ? ?Review of Glycemic Control ? Latest Reference Range & Units 09/18/21 17:52 09/18/21 19:20 09/18/21 21:44 09/19/21 06:04 09/19/21 07:39  ?Glucose-Capillary 70 - 99 mg/dL 014 (H) 103 (H) 013 (H) 455 (H) 459 (H)  ?(H): Data is abnormally high ? ? ?Inpatient Diabetes Program Recommendations:   ?-Increase Semglee to 36 units qd (0.2 units/kg x 180.5) ?Secure chat sent to Dr. Marland Mcalpine. ?Nurses are working with patient teaching insulin administration and allowing patient to give own injections. ?Will plan to speak with patient in am. ? ?Thank you, ?Billy Fischer Breyah Akhter, RN, MSN, CDE  ?Diabetes Coordinator ?Inpatient Glycemic Control Team ?Team Pager [phone] (8am-5pm) ?09/19/2021 8:22 AM ? ? ? ? ? ?

## 2021-09-19 NOTE — Progress Notes (Signed)
Patient using hospital CPAP unit independently. RT made sure machine was on proper settings for patient and plugged into the red outlet on the wall. Patient told to call if he had issues or needed help. ?

## 2021-09-19 NOTE — Progress Notes (Signed)
?PROGRESS NOTE ? ? ? ?Sean Murillo  NMM:[id number] DOB: 01-29-1976 DOA: 09/17/2021 ?PCP: Avis Epley, PA-C  ? ?Brief Narrative:  ?HPI by Dr. Ramiro Harvest on 09/17/21 ?Sean Murillo is a 46 y.o. male with medical history significant of hypertension, morbid obesity, hyperlipidemia, depression/anxiety, status post splenectomy, PTSD, chronic low back pain presented to the ED with altered mental status.  At time of interview patient unable to answer questions appropriately or reliably and as such history obtained per ED physician.  No family at bedside.  It is noted per ED physician that patient with altered mental status.  It is noted that patient having diarrhea about 4 to 5 days ago and over the weekend developed some emesis.  Patient noted to have developed some increased thirst with no prior history of diabetes.  It is noted that patient woke up on the morning of admission and was altered.  No fevers, no cough.  It is reported per EMS that patient noted to have sats of 87% on room air patient placed on 6 L nasal cannula and brought to the ED.  Initial CBG reading in the ED was high.  Patient states no when asked review of systems questions. ?ED Course: Patient seen in the ED noted to have significant altered mental status however protecting airway, pulling out IVs.  Vital signs on presentation noted a blood pressure of 120/72, tachycardic with heart rate as high as 133, tachypneic with a respiratory rate as high as 35, noted to have 87 6% sats on room air initially placed on 6 L nasal cannula currently on 4 L nasal cannula with sats of 94%.  ABG obtained with a pH of 7.31/PCO2 of 50/PO2 of 73.  Comprehensive metabolic profile with a sodium of 130, potassium of 5.4, chloride of 91, glucose of 1576, BUN of 28, creatinine of 1.53, calcium of 10.4, magnesium of 2.7, anion gap of 16, ALT of 59, protein of 8.5 otherwise was within normal limits.  Initial lactic acid level of 4.8 with repeat at 3.9.  CBC with  a white count of 11.1 with a ANC of 9.3 otherwise was within normal limits.  INR 1.1.  Glucose of 1576.  Beta hydroxybutyric acid of 1.56.  COVID-19 PCR negative.  Influenza A and B PCR negative.  Urinalysis clear, straw-colored, glucose > 500, nitrite negative, leukocytes negative, protein negative, specific gravity 1.028.  No bacteria seen.  UDS negative.  Chest x-ray with cardiomegaly, no signs of pulmonary edema.  Increased density in both lower lung fields may be related to chest wall attenuation of pleural effusions and underlying infiltrates.  CT head with apparent small, subcentimeter ill-defined hypodensity within the right frontal lobe.  Could potentially represent a small amount of acute hemorrhage versus artifact given streak artifact in this region.  No appreciable edema or mass effect.  Patient pancultured.  Patient given a dose of IV Rocephin.  Patient placed on the Endo tool, IV fluids, supportive care.  Repeat head CT pending for 1500 hrs. per ED physician. ?  ?**Interim History ?Patient's mental status is improving slowly and he is awake and alert and oriented.  His renal function remains elevated he was given 2 more liter lactated ringer boluses along with a 500 mL given his elevated lactic acid.  Lactic acid trended up to 4.9 is now improved and resolved at 1.8.  We will continue IV fluid hydration but change from the D5 LR to just LR at 75 MLS per hour.  Patient was transitioned  off the insulin drip and he was initiated on 18 units of Semglee but this has been uptitrated to 36 units.  Renal ultrasound has been normal.  The patient is to undergo a CTA of the head for his vascular malformation noted after discussion with the neurology team yesterday. ? ?He underwent the CTA of his head and showed a faint right frontal lobe enhancement at the site of initial high density by CT likely developmental venous anomaly/cavernoma with no detectable arterial lesion but an incidental polyp in the right nasal  cavity noted.  ? ? ?Assessment and Plan: ?* Hyperosmolar hyperglycemic state (HHS) (HCC) ?-Patient presented to the ED with acute metabolic encephalopathy/altered mental status. ?-No prior history of diabetes and always has been "Pre-Diabetic" per wife but last hemoglobin A1c 6.6 (05/24/2021) ?-Patient on presentation with ABG pH of 7.31, PCO2 of 50, PO2 of 73.  Comprehensive metabolic profile with a potassium of 5.4, sodium of 130, glucose of 1576, BUN of 28, creatinine of 1.53, anion gap of 16.  Lactic acid at 4.8.  Urinalysis with > 500 glucose, ketones negative.  UDS negative. ?-Chest x-ray with concerns for possible infiltrate in the bases.  Beta hydroxybutyric acid elevated at 1.56 and now resolved.  Afebrile. ?-Patient placed on Endo tool which we will continue for now. ?-Given 2 L normal saline bolus.  And then given 3 LR boluses given his lactic acid level still being elevated ?-N.p.o. except ice chips and sips with meds but since he is improved we will advance his diet and now placed on carb modified diet ?-Aggressive IV fluid resuscitation. ?-Check a hemoglobin A1c and is now 12.3. ?-BMET every 4 hours, CBG every hour. ?-IV fluids changed and now have resumed at LR 75 MLS per hour.  Received 2 and half liter boluses yesterday ?-CBGs have been uncontrolled in the setting of prednisone for his scan now and have been ranging from 165-480 ?-His anion gap has closed and he has been transitioned to subcutaneous insulin and have increased to 36 units of Levemir and increased to resistant NovoLog sliding scale insulin AC and at bedtime along with 4 units of NovoLog 3 times daily AC ?-Consulted diabetes education coordinator for further evaluation and recommendations and further insulin teaching ?-Supportive care. ? ?SIRS (systemic inflammatory response syndrome) (HCC) ?- Patient presented to the ED with altered mental status, tachycardia, tachypnea, lactic acidosis, leukocytosis, noted with HHS. ?-Patient  pancultured with blood cultures showed no growth to date at 2 days, urine cultures show 10,000 colony-forming units of Staphylococcus epidermidis.   ?-Chest x-ray with concern for possible bibasilar infiltrates but repeat CXR showed "Degraded exam secondary to patient body habitus and AP portable technique. Cardiomegaly without congestive failure or gross finding in the upper lungs. The lung bases are poorly evaluated." ?-Repeat lactic acid level and trended up to 4.9 but after several fluid boluses has now normalized and is now 1.8 ?-We will discontinue antibiotics as WBC is improving and monitor off of antibiotics for now ?-Urinalysis was relatively unremarkable and showed straw-colored urine with greater than 500 glucose and small hemoglobin with negative ketones, negative nitrites and negative leukocytes.  Urine culture showed 10,000 colony-forming units of Staph epidermidis ?-Patient received a dose of IV Rocephin in the ED and will place on IV cefepime empirically pending culture and sensitivities are on revealing we will discontinue. ?-We will discontinue the D5 and discontinue LR now at 75 MLS per hour ? ?Acute metabolic encephalopathy ?- Likely secondary to problem #1 of hyperosmolar  hyperglycemic state. ?-Initial Head CT with no significant acute abnormalities however area of apparent small, subcentimeter ill-defined hypodensity within the right frontal lobe. ?-Repeat head CT done and showed "Image quality degraded by streak artifact from large patient size.Subtle hyperdensity in the right frontal lobe is less apparent. I doubt this represents acute hemorrhage. This could be chronic hemorrhage due to cavernoma or  calcification. Consider MRI if the patient is able to fit in the MRI machine."**C brain vascular malformation ?-Ammonia levels within normal limits. ?-Patient pancultured and awaiting culture results. ?-Placed on empiric IV antibiotics, aggressive fluid resuscitation, management as in problem #1  hyperosmolar hyperglycemic state. ?-Mentation is improving and the patient appears to be back to his baseline ? ?AKI (acute kidney injury) (HCC) ?- Likely secondary to a prerenal azotemia in the setting of

## 2021-09-19 NOTE — Plan of Care (Signed)
  Problem: Education: Goal: Knowledge of General Education information will improve Description Including pain rating scale, medication(s)/side effects and non-pharmacologic comfort measures Outcome: Progressing   Problem: Health Behavior/Discharge Planning: Goal: Ability to manage health-related needs will improve Outcome: Progressing   

## 2021-09-19 NOTE — Progress Notes (Signed)
[place] notification completed ?Your notification ID is: ?[id number] ?

## 2021-09-19 NOTE — Assessment & Plan Note (Addendum)
-   Likely reactive and had trended up in the setting of his dehydration from his HHS ?-Patient WBC went from 11.1 and trended up to 23.1 is now 15.3 -> 16.6 and trended up in the setting of Steroids for his CTA  ?-We will discontinue antibiotics for now and observe off of antibiotics ?-Continue monitor for signs and symptoms infection; currently he is afebrile and we will repeat his WBC within 1 week ?

## 2021-09-19 NOTE — Progress Notes (Signed)
Date and time results received: 09/19/21 0530 ?(use smartphrase ".now" to insert current time) ? ?Test: Glucose ?Critical Value: 503 ? ?Name of Provider Notified: O. Adefeso DO ? ?Orders Received? Or Actions Taken?: Actions Taken: Awaiting orders ?

## 2021-09-20 ENCOUNTER — Inpatient Hospital Stay (HOSPITAL_COMMUNITY): Payer: No Typology Code available for payment source

## 2021-09-20 DIAGNOSIS — E876 Hypokalemia: Secondary | ICD-10-CM

## 2021-09-20 LAB — CBC WITH DIFFERENTIAL/PLATELET
Abs Immature Granulocytes: 0.09 10*3/uL — ABNORMAL HIGH (ref 0.00–0.07)
Basophils Absolute: 0.1 10*3/uL (ref 0.0–0.1)
Basophils Relative: 1 %
Eosinophils Absolute: 0.1 10*3/uL (ref 0.0–0.5)
Eosinophils Relative: 1 %
HCT: 39.8 % (ref 39.0–52.0)
Hemoglobin: 13.4 g/dL (ref 13.0–17.0)
Immature Granulocytes: 1 %
Lymphocytes Relative: 43 %
Lymphs Abs: 7.2 10*3/uL — ABNORMAL HIGH (ref 0.7–4.0)
MCH: 27.5 pg (ref 26.0–34.0)
MCHC: 33.7 g/dL (ref 30.0–36.0)
MCV: 81.7 fL (ref 80.0–100.0)
Monocytes Absolute: 1.4 10*3/uL — ABNORMAL HIGH (ref 0.1–1.0)
Monocytes Relative: 9 %
Neutro Abs: 7.7 10*3/uL (ref 1.7–7.7)
Neutrophils Relative %: 45 %
Platelets: 318 10*3/uL (ref 150–400)
RBC: 4.87 MIL/uL (ref 4.22–5.81)
RDW: 15.5 % (ref 11.5–15.5)
WBC: 16.6 10*3/uL — ABNORMAL HIGH (ref 4.0–10.5)
nRBC: 0.2 % (ref 0.0–0.2)

## 2021-09-20 LAB — COMPREHENSIVE METABOLIC PANEL
ALT: 57 U/L — ABNORMAL HIGH (ref 0–44)
AST: 93 U/L — ABNORMAL HIGH (ref 15–41)
Albumin: 2.8 g/dL — ABNORMAL LOW (ref 3.5–5.0)
Alkaline Phosphatase: 85 U/L (ref 38–126)
Anion gap: 11 (ref 5–15)
BUN: 16 mg/dL (ref 6–20)
CO2: 26 mmol/L (ref 22–32)
Calcium: 8.1 mg/dL — ABNORMAL LOW (ref 8.9–10.3)
Chloride: 101 mmol/L (ref 98–111)
Creatinine, Ser: 0.74 mg/dL (ref 0.61–1.24)
GFR, Estimated: >60 mL/min (ref >60)
Glucose, Bld: 294 mg/dL — ABNORMAL HIGH (ref 70–99)
Potassium: 3.2 mmol/L — ABNORMAL LOW (ref 3.5–5.1)
Sodium: 138 mmol/L (ref 135–145)
Total Bilirubin: 0.9 mg/dL (ref 0.3–1.2)
Total Protein: 6.7 g/dL (ref 6.5–8.1)

## 2021-09-20 LAB — GLUCOSE, CAPILLARY
Glucose-Capillary: 255 mg/dL — ABNORMAL HIGH (ref 70–99)
Glucose-Capillary: 269 mg/dL — ABNORMAL HIGH (ref 70–99)
Glucose-Capillary: 258 mg/dL — ABNORMAL HIGH (ref 70–99)
Glucose-Capillary: 298 mg/dL — ABNORMAL HIGH (ref 70–99)

## 2021-09-20 LAB — PHOSPHORUS: Phosphorus: 2.3 mg/dL — ABNORMAL LOW (ref 2.5–4.6)

## 2021-09-20 LAB — MAGNESIUM: Magnesium: 2.3 mg/dL (ref 1.7–2.4)

## 2021-09-20 MED ORDER — LIVING WELL WITH DIABETES BOOK: Freq: Once | Status: AC

## 2021-09-20 MED ORDER — INSULIN ASPART 100 UNIT/ML FLEXPEN: 10.0000 [IU] | PEN_INJECTOR | Freq: Three times a day (TID) | SUBCUTANEOUS | 0 refills | Status: AC

## 2021-09-20 MED ORDER — INSULIN GLARGINE 100 UNIT/ML SOLOSTAR PEN: 50.0000 [IU] | PEN_INJECTOR | Freq: Every day | SUBCUTANEOUS | 0 refills | Status: DC

## 2021-09-20 MED ORDER — BLOOD GLUCOSE MONITOR KIT: PACK | 0 refills | Status: DC

## 2021-09-20 MED ORDER — OZEMPIC (1 MG/DOSE) 4 MG/3ML ~~LOC~~ SOPN: 1.0000 mg | PEN_INJECTOR | SUBCUTANEOUS | 0 refills | Status: DC

## 2021-09-20 MED ORDER — NICOTINE 21 MG/24HR TD PT24
21.0000 mg | MEDICATED_PATCH | Freq: Every day | TRANSDERMAL | 0 refills | Status: DC
Start: 2021-09-21 — End: 2022-05-25

## 2021-09-20 MED ORDER — INSULIN GLARGINE-YFGN 100 UNIT/ML ~~LOC~~ SOLN
50.0000 [IU] | Freq: Every day | SUBCUTANEOUS | Status: DC
  Filled 2021-09-20: qty 0.5

## 2021-09-20 MED ORDER — INSULIN ASPART 100 UNIT/ML IJ SOLN
0.0000 [IU] | Freq: Three times a day (TID) | INTRAMUSCULAR | Status: DC
  Administered 2021-09-20: 11 [IU] via SUBCUTANEOUS

## 2021-09-20 MED ORDER — NICOTINE 21 MG/24HR TD PT24: 21.0000 mg | MEDICATED_PATCH | Freq: Every day | TRANSDERMAL | 0 refills | Status: DC

## 2021-09-20 MED ORDER — POTASSIUM CHLORIDE CRYS ER 20 MEQ PO TBCR
40.0000 meq | EXTENDED_RELEASE_TABLET | Freq: Once | ORAL | Status: AC
  Administered 2021-09-20: 40 meq via ORAL
  Filled 2021-09-20: qty 2

## 2021-09-20 MED ORDER — INSULIN ASPART 100 UNIT/ML IJ SOLN: 0.0000 [IU] | Freq: Every day | INTRAMUSCULAR | Status: DC

## 2021-09-20 MED ORDER — BLOOD GLUCOSE MONITOR KIT: PACK | 0 refills | Status: AC

## 2021-09-20 MED ORDER — INSULIN ASPART 100 UNIT/ML FLEXPEN: 10.0000 [IU] | PEN_INJECTOR | Freq: Three times a day (TID) | SUBCUTANEOUS | 0 refills | Status: DC

## 2021-09-20 MED ORDER — PEN NEEDLES 31G X 8 MM MISC
1.0000 | Freq: Three times a day (TID) | 0 refills | Status: DC
Start: 2021-09-20 — End: 2021-09-20

## 2021-09-20 MED ORDER — PEN NEEDLES 31G X 8 MM MISC: 1.0000 | Freq: Three times a day (TID) | 0 refills | Status: AC

## 2021-09-20 MED ORDER — DEXTROSE 5 % IV SOLN
15.0000 mmol | Freq: Once | INTRAVENOUS | Status: AC
  Administered 2021-09-20: 15 mmol via INTRAVENOUS
  Filled 2021-09-20: qty 5

## 2021-09-20 MED ORDER — INSULIN ASPART 100 UNIT/ML IJ SOLN
10.0000 [IU] | Freq: Three times a day (TID) | INTRAMUSCULAR | Status: DC
  Administered 2021-09-20: 10 [IU] via SUBCUTANEOUS

## 2021-09-20 NOTE — Assessment & Plan Note (Signed)
Hypophosphatemia ?-Patient's K+ was 3.2 and Phos Level was 2.3 ?-Replete with IV K Phos 15 mmol and po KCl 40 mEQ ?-Continue to Monitor and Trend  ?-Repeat CMP and Phos Level in the outpatient setting  ?

## 2021-09-20 NOTE — Evaluation (Signed)
Physical Therapy Evaluation ?Patient Details ?Name: Gwenyth BenderMichael Umholtz ?MRN: [id number] ?DOB: 03/07/1976 ?Today's Date: 09/20/2021 ? ?History of Present Illness ? Gwenyth BenderMichael Mcalpine is a 46 y.o. male with medical history significant of hypertension, morbid obesity, hyperlipidemia, depression/anxiety, status post splenectomy, PTSD, chronic low back pain presented to the ED with altered mental status.  At time of interview patient unable to answer questions appropriately or reliably and as such history obtained per ED physician.  No family at bedside.  It is noted per ED physician that patient with altered mental status.  It is noted that patient having diarrhea about 4 to 5 days ago and over the weekend developed some emesis.  Patient noted to have developed some increased thirst with no prior history of diabetes.  It is noted that patient woke up on the morning of admission and was altered.  No fevers, no cough.  It is reported per EMS that patient noted to have sats of 87% on room air patient placed on 6 L nasal cannula and brought to the ED.  Initial CBG reading in the ED was high.  Patient states no when asked review of systems questions. ?  ?Clinical Impression ? Patient functioning near baseline for functional mobility and gait demonstrating good return for bed mobility, transfers and ambulating in room/hallway using SPC without loss of balance.  Patient slightly limited for functional activities mostly due to c/o fatigue and tolerated sitting up on commode in bathroom after therapy with his spouse in room.  Patient will benefit from continued skilled physical therapy in hospital and recommended venue below to increase strength, balance, endurance for safe ADLs and gait.  ?   ?   ? ?Recommendations for follow up therapy are one component of a multi-disciplinary discharge planning process, led by the attending physician.  Recommendations may be updated based on patient status, additional functional criteria and  insurance authorization. ? ?Follow Up Recommendations No PT follow up ? ?  ?Assistance Recommended at Discharge PRN  ?Patient can return home with the following ? Other (comment) (patient is near baseline) ? ?  ?Equipment Recommendations None recommended by PT  ?Recommendations for Other Services ?    ?  ?Functional Status Assessment Patient has had a recent decline in their functional status and demonstrates the ability to make significant improvements in function in a reasonable and predictable amount of time.  ? ?  ?Precautions / Restrictions Precautions ?Precautions: None ?Restrictions ?Weight Bearing Restrictions: No  ? ?  ? ?Mobility ? Bed Mobility ?Overal bed mobility: Modified Independent ?  ?  ?  ?  ?  ?  ?  ?  ? ?Transfers ?Overall transfer level: Modified independent ?  ?  ?  ?  ?  ?  ?  ?  ?General transfer comment: good return for transfers without AD and using SPC ?  ? ?Ambulation/Gait ?Ambulation/Gait assistance: Supervision ?Gait Distance (Feet): 65 Feet ?Assistive device: Straight cane ?Gait Pattern/deviations: Decreased step length - right, Decreased step length - left, Decreased stride length, Step-through pattern ?Gait velocity: decreased ?  ?  ?General Gait Details: slightly labord cadence without loss of balance good return for using SPC with mostly step-through pattern, limited mostly due to fatigue ? ?Stairs ?  ?  ?  ?  ?  ? ?Wheelchair Mobility ?  ? ?Modified Rankin (Stroke Patients Only) ?  ? ?  ? ?Balance Overall balance assessment: Needs assistance ?Sitting-balance support: Feet supported, No upper extremity supported ?Sitting balance-Leahy Scale: Good ?Sitting balance -  Comments: seated EOB ?  ?Standing balance support: During functional activity, No upper extremity supported ?Standing balance-Leahy Scale: Fair ?Standing balance comment: fair/good using SPC ?  ?  ?  ?  ?  ?  ?  ?  ?  ?  ?  ?   ? ? ? ?Pertinent Vitals/Pain Pain Assessment ?Pain Assessment: 0-10 ?Pain Score: 8  ?Pain  Location: low back ?Pain Descriptors / Indicators: Sharp  ? ? ?Home Living Family/patient expects to be discharged to:: Private residence ?Living Arrangements: Spouse/significant other ?Available Help at Discharge: Family;Available 24 hours/day ?Type of Home: House ?Home Access: Ramped entrance ?  ?  ?  ?Home Layout: One level ?Home Equipment: Agricultural consultant (2 wheels);Cane - single point;Shower seat;Wheelchair - manual ?Additional Comments: Pt reports he has a scooter coming soon.  ?  ?Prior Function Prior Level of Function : Needs assist ?  ?  ?  ?Physical Assist : Mobility (physical) ?Mobility (physical): Gait;Transfers;Stairs ?  ?Mobility Comments: Pt reported household ambulation with SPC and scooter when shopping. ?ADLs Comments: Pt is assisted for lower body dressing, peri-care, and washing his feet. Pt is also assisted for IADL's. ?  ? ? ?Hand Dominance  ? Dominant Hand: Right ? ?  ?Extremity/Trunk Assessment  ? Upper Extremity Assessment ?Upper Extremity Assessment: Defer to OT evaluation ?  ? ?Lower Extremity Assessment ?Lower Extremity Assessment: Overall WFL for tasks assessed ?  ? ?Cervical / Trunk Assessment ?Cervical / Trunk Assessment: Normal  ?Communication  ? Communication: No difficulties  ?Cognition Arousal/Alertness: Awake/alert ?Behavior During Therapy: [facility] for tasks assessed/performed ?Overall Cognitive Status: Within Functional Limits for tasks assessed ?  ?  ?  ?  ?  ?  ?  ?  ?  ?  ?  ?  ?  ?  ?  ?  ?  ?  ?  ? ?  ?General Comments   ? ?  ?Exercises    ? ?Assessment/Plan  ?  ?PT Assessment Patient needs continued PT services  ?PT Problem List Decreased strength;Decreased activity tolerance;Decreased balance;Decreased mobility ? ?   ?  ?PT Treatment Interventions DME instruction;Gait training;Stair training;Functional mobility training;Therapeutic activities;Therapeutic exercise;Balance training;Patient/family education   ? ?PT Goals (Current goals can be found in the Care Plan section)   ?Acute Rehab PT Goals ?Patient Stated Goal: return home with family to assist ?PT Goal Formulation: With patient/family ?Time For Goal Achievement: 09/23/21 ?Potential to Achieve Goals: Good ? ?  ?Frequency Min 2X/week ?  ? ? ?Co-evaluation PT/OT/SLP Co-Evaluation/Treatment: Yes ?Reason for Co-Treatment: To address functional/ADL transfers ?PT goals addressed during session: Mobility/safety with mobility;Balance;Proper use of DME ?  ?  ? ? ?  ?AM-PAC PT "6 Clicks" Mobility  ?Outcome Measure Help needed turning from your back to your side while in a flat bed without using bedrails?: None ?Help needed moving from lying on your back to sitting on the side of a flat bed without using bedrails?: None ?Help needed moving to and from a bed to a chair (including a wheelchair)?: A Little ?Help needed standing up from a chair using your arms (e.g., wheelchair or bedside chair)?: None ?Help needed to walk in hospital room?: A Little ?Help needed climbing 3-5 steps with a railing? : A Little ?6 Click Score: 21 ? ?  ?End of Session   ?Activity Tolerance: Patient tolerated treatment well;Patient limited by fatigue ?Patient left: with call bell/phone within reach;Other (comment);with family/visitor present (left seated on commode in bathroom) ?Nurse Communication: Mobility status ?PT Visit Diagnosis:  Unsteadiness on feet (R26.81);Other abnormalities of gait and mobility (R26.89);Muscle weakness (generalized) (M62.81) ?  ? ?Time: 3664-4034 ?PT Time Calculation (min) (ACUTE ONLY): 30 min ? ? ?Charges:   PT Evaluation ?$PT Eval Moderate Complexity: 1 Mod ?PT Treatments ?$Therapeutic Activity: 23-37 mins ?  ?   ? ? ?1:49 PM, 09/20/21 ?Ocie Bob, MPT ?Physical Therapist with [place] ?[facility] ?[phone] office ?[phone] mobile phone ? ? ?

## 2021-09-20 NOTE — TOC Progression Note (Signed)
Transition of Care (TOC) - Progression Note  ? ? ?Patient Details  ?Name: Sean Murillo ?MRN: [id number] ?Date of Birth: [birth year] ? ?Transition of Care (TOC) CM/SW Contact  ?Hetty Ely, RN ?Phone Number: ?09/20/2021, 11:31 AM ? ?Clinical Narrative: Received voice message from [place] [phone] [phone] to report that discharge SW Mallie Darting should be contacted for discharge need, pager [phone]. PCP Dr. Louis Meckel. Attempted to page SW and operator about discharge needs for Insulin and Glucometer, message left to return call. ? ? ? ?  ?  ? ?Expected Discharge Plan and Services ?  ?  ?  ?  ?  ?                ?  ?  ?  ?  ?  ?  ?  ?  ?  ?  ? ? ?Social Determinants of Health (SDOH) Interventions ?  ? ?Readmission Risk Interventions ?   ? View : No data to display.  ?  ?  ?  ? ? ?

## 2021-09-20 NOTE — Progress Notes (Signed)
Nsg Discharge Note ? ?Admit Date:  09/17/2021 ?Discharge date: 09/20/2021 ?  ?Jamey Ripa to be D/C'd Home per MD order.  AVS completed.   ?Patient/caregiver able to verbalize understanding. ? ?Discharge Medication: ?Allergies as of 09/20/2021   ? ?   Reactions  ? Contrast Media [iodinated Contrast Media] Hives  ? Iohexol  ? Shellfish Allergy Hives  ? ?  ? ?  ?Medication List  ?  ? ?STOP taking these medications   ? ?acetaminophen-codeine 300-30 MG tablet ?Commonly known as: TYLENOL #3 ?  ?HYDROcodone-acetaminophen 5-325 MG tablet ?Commonly known as: NORCO/VICODIN ?  ?meloxicam 7.5 MG tablet ?Commonly known as: Mobic ?  ?naproxen 500 MG tablet ?Commonly known as: NAPROSYN ?  ?predniSONE 20 MG tablet ?Commonly known as: DELTASONE ?  ?tiZANidine 4 MG tablet ?Commonly known as: Zanaflex ?  ? ?  ? ?TAKE these medications   ? ?blood glucose meter kit and supplies Kit ?Dispense based on patient and insurance preference. Use up to four times daily as directed. ?  ?insulin aspart 100 UNIT/ML FlexPen ?Commonly known as: NOVOLOG ?Inject 10 Units into the skin 3 (three) times daily with meals. ?  ?insulin glargine 100 UNIT/ML Solostar Pen ?Commonly known as: LANTUS ?Inject 50 Units into the skin daily. ?  ?lisinopril 40 MG tablet ?Commonly known as: ZESTRIL ?Take 40 mg by mouth daily. ?  ?nicotine 21 mg/24hr patch ?Commonly known as: NICODERM CQ - dosed in mg/24 hours ?Place 1 patch (21 mg total) onto the skin daily. ?Start taking on: September 21, 2021 ?  ?Ozempic (1 MG/DOSE) 4 MG/3ML Sopn ?Generic drug: Semaglutide (1 MG/DOSE) ?Inject 1 mg into the skin once a week. ?  ?Pen Needles 31G X 8 MM Misc ?1 Container by Does not apply route 4 (four) times daily -  with meals and at bedtime. ?  ?sertraline 100 MG tablet ?Commonly known as: ZOLOFT ?Take 200 mg by mouth daily. ?  ? ?  ? ?  ?  ? ? ?  ?Discharge Care Instructions  ?(From admission, onward)  ?  ? ? ?  ? ?  Start     Ordered  ? 09/20/21 0000  Discharge wound care:        ?Comments: Wound care to midline chronic full thickness wound:  cleanse with NS, pat dry. Cover with size appropriate piece of xeroform gauze, top with dry gauze and secure with silicone foam. Change daily.  ? 09/20/21 1439  ? ?  ?  ? ?  ? ? ?Discharge Assessment: ?Vitals:  ? 09/20/21 0536 09/20/21 1213  ?BP: (!) 141/83 (!) 148/94  ?Pulse: 81 89  ?Resp: 18 20  ?Temp: 98.9 ?F (37.2 ?C) 98.7 ?F (37.1 ?C)  ?SpO2: 92% 94%  ? Skin clean, dry and intact without evidence of skin break down, no evidence of skin tears noted. ?IV catheter discontinued intact. Site without signs and symptoms of complications - no redness or edema noted at insertion site, patient denies c/o pain - only slight tenderness at site.  Dressing with slight pressure applied. ? ?D/c Instructions-Education: ?Discharge instructions given to patient/family with verbalized understanding. ?D/c education completed with patient/family including follow up instructions, medication list, d/c activities limitations if indicated, with other d/c instructions as indicated by MD - patient able to verbalize understanding, all questions fully answered. ?Patient instructed to return to ED, call 911, or call MD for any changes in condition.  ?Patient escorted via [place], and D/C home via private auto. ? ?Kathie Rhodes, RN ?  09/20/2021 3:28 PM  ?

## 2021-09-20 NOTE — Evaluation (Signed)
Occupational Therapy Evaluation ?Patient Details ?Name: Sean Murillo ?MRN: [id number] ?DOB: 1975/07/02 ?Today's Date: 09/20/2021 ? ? ?History of Present Illness Sean Murillo is a 46 y.o. male with medical history significant of hypertension, morbid obesity, hyperlipidemia, depression/anxiety, status post splenectomy, PTSD, chronic low back pain presented to the ED with altered mental status.  At time of interview patient unable to answer questions appropriately or reliably and as such history obtained per ED physician.  No family at bedside.  It is noted per ED physician that patient with altered mental status.  It is noted that patient having diarrhea about 4 to 5 days ago and over the weekend developed some emesis.  Patient noted to have developed some increased thirst with no prior history of diabetes.  It is noted that patient woke up on the morning of admission and was altered.  No fevers, no cough.  It is reported per EMS that patient noted to have sats of 87% on room air patient placed on 6 L nasal cannula and brought to the ED.  Initial CBG reading in the ED was high.  Patient states no when asked review of systems questions. (Per MD note)  ? ?Clinical Impression ?  ?Pt agreeable to OT and PT co-evaluation. Pt appears to operating near baseline levels with mild unsteadiness on feet but modified independent overall for bed and functional mobility. Pt reports increase in blurry vision due to blood sugar issues. Reports blurry vision continues but is improving. Lower body dressing and toilet transfer completed with modified independence as well. Pt is not recommended for further acute OT services and will be discharged to care of nursing staff for remaining length of stay.  ?   ? ?Recommendations for follow up therapy are one component of a multi-disciplinary discharge planning process, led by the attending physician.  Recommendations may be updated based on patient status, additional functional criteria  and insurance authorization.  ? ?Follow Up Recommendations ? No OT follow up  ?  ?Assistance Recommended at Discharge PRN  ?Patient can return home with the following A little help with bathing/dressing/bathroom;Assistance with cooking/housework ? ?  ?Functional Status Assessment ? Patient has not had a recent decline in their functional status  ?Equipment Recommendations ? None recommended by OT  ?  ?Recommendations for Other Services   ? ? ?  ?Precautions / Restrictions Restrictions ?Weight Bearing Restrictions: No  ? ?  ? ?Mobility Bed Mobility ?Overal bed mobility: Modified Independent ?  ?  ?  ?  ?  ?  ?General bed mobility comments: Mild labored movement. ?  ? ?Transfers ?Overall transfer level: Modified independent ?Equipment used: Straight cane ?  ?  ?  ?  ?  ?  ?  ?  ?  ? ?  ?Balance Overall balance assessment: Needs assistance ?Sitting-balance support: No upper extremity supported, Feet supported ?Sitting balance-Leahy Scale: Good ?Sitting balance - Comments: seated EOB ?  ?Standing balance support: Single extremity supported, During functional activity ?Standing balance-Leahy Scale: Fair ?Standing balance comment: fair to good using cane ?  ?  ?  ?  ?  ?  ?  ?  ?  ?  ?  ?   ? ?ADL either performed or assessed with clinical judgement  ? ?ADL Overall ADL's : Needs assistance/impaired ?  ?  ?Grooming: Modified independent;Sitting ?  ?  ?  ?  ?  ?Upper Body Dressing : Modified independent;Sitting ?  ?Lower Body Dressing: Modified independent;Sitting/lateral leans ?Lower Body Dressing Details (indicate cue  type and reason): This date pt was able to don socks without physical assist seated at EOB. ?Toilet Transfer: Modified Independent;Ambulation (using cane or holding IV pole) ?Toilet Transfer Details (indicate cue type and reason): Chair to toilet holding onto IV pole. ?  ?  ?  ?  ?Functional mobility during ADLs: Modified independent;Cane ?General ADL Comments: Pt able to ambulate with modified independence  using cane.  ? ? ? ?Vision Baseline Vision/History: 1 Wears glasses ?Ability to See in Adequate Light: 2 Moderately impaired ?Patient Visual Report: Blurring of vision (Pt reports that vision has been blurry due to blood sugar issues. Pt reports burry vision is improving but not back to normal.) ?Vision Assessment?: Yes ?Eye Alignment: Within Functional Limits ?Tracking/Visual Pursuits: Able to track stimulus in all quads without difficulty ?Additional Comments: Pt reports blurry vision with inability to read the board ~8 feet away.  ?   ?   ?  ?   ?  ? ?Pertinent Vitals/Pain Pain Assessment ?Pain Assessment: 0-10 ?Pain Score: 8  ?Pain Location: low back ?Pain Descriptors / Indicators: Sharp ?Pain Intervention(s): Limited activity within patient's tolerance, Monitored during session, Repositioned  ? ? ? ?Hand Dominance Right ?  ?Extremity/Trunk Assessment Upper Extremity Assessment ?Upper Extremity Assessment: Overall WFL for tasks assessed ?  ?Lower Extremity Assessment ?Lower Extremity Assessment: Defer to PT evaluation ?  ?Cervical / Trunk Assessment ?Cervical / Trunk Assessment: Normal ?  ?Communication Communication ?Communication: No difficulties ?  ?Cognition Arousal/Alertness: Awake/alert ?Behavior During Therapy: [facility] for tasks assessed/performed ?Overall Cognitive Status: Within Functional Limits for tasks assessed ?  ?  ?  ?  ?  ?  ?  ?  ?  ?  ?  ?  ?  ?  ?  ?  ?  ?  ?  ?   ? ?  ?   ?  ?    ? ? ?Home Living Family/patient expects to be discharged to:: Private residence ?Living Arrangements: Spouse/significant other ?Available Help at Discharge: Family;Available 24 hours/day ?Type of Home: House ?Home Access: Ramped entrance ?  ?  ?Home Layout: One level ?  ?  ?Bathroom Shower/Tub: Walk-in shower ?  ?Bathroom Toilet: Handicapped height ?  ?  ?Home Equipment: Agricultural consultant (2 wheels);Cane - single point;Shower seat;Wheelchair - manual ?  ?Additional Comments: Pt reports he has a scooter coming soon. ?  ? ?   ?Prior Functioning/Environment Prior Level of Function : Needs assist ?  ?  ?  ?Physical Assist : Mobility (physical) ?Mobility (physical): Gait;Transfers;Stairs ?  ?Mobility Comments: Pt reported household ambulation with SPC and scooter when shopping. ?ADLs Comments: Pt is assisted for lower body dressing, peri-care, and washing his feet. Pt is also assisted for IADL's. ?  ? ?  ?  ?   ?  ?   ?    ?  ?OT Goals(Current goals can be found in the care plan section) Acute Rehab OT Goals ?Patient Stated Goal: return home  ?OT Frequency:   ?  ? ?Co-evaluation PT/OT/SLP Co-Evaluation/Treatment: Yes ?Reason for Co-Treatment: To address functional/ADL transfers ?  ?OT goals addressed during session: ADL's and self-care ?  ? ?  ?   ?  ?  ?  ?  ?  ?  ?  ?End of Session Equipment Utilized During Treatment: Other (comment) (cane) ? ?Activity Tolerance: Patient tolerated treatment well ?Patient left: in chair;with call bell/phone within reach ? ?OT Visit Diagnosis: Unsteadiness on feet (R26.81);Other (comment);Other symptoms and signs involving cognitive function (AMS)  ?              ?  Time: [id number] ?OT Time Calculation (min): 25 min ?Charges:  OT General Charges ?$OT Visit: 1 Visit ?OT Evaluation ?$OT Eval Low Complexity: 1 Low ? ?Danie ChandlerSAMUEL Andie Mortimer OT, MOT ? ?Danie ChandlerSamuel  Zhane Donlan ?09/20/2021, 10:05 AM ?

## 2021-09-20 NOTE — Plan of Care (Signed)
?  Problem: Acute Rehab PT Goals(only PT should resolve) ?Goal: Pt Will Go Supine/Side To Sit ?Outcome: Progressing ?Flowsheets (Taken 09/20/2021 1350) ?Pt will go Supine/Side to Sit: ? Independently ? with modified independence ?Goal: Patient Will Transfer Sit To/From Stand ?Outcome: Progressing ?Flowsheets (Taken 09/20/2021 1350) ?Patient will transfer sit to/from stand: ? with modified independence ? Independently ?Goal: Pt Will Transfer Bed To Chair/Chair To Bed ?Outcome: Progressing ?Flowsheets (Taken 09/20/2021 1350) ?Pt will Transfer Bed to Chair/Chair to Bed: ? with modified independence ? Independently ?Goal: Pt Will Ambulate ?Outcome: Progressing ?Flowsheets (Taken 09/20/2021 1350) ?Pt will Ambulate: ? 100 feet ? with cane ? with modified independence ?  ?1:51 PM, 09/20/21 ?Ocie Bob, MPT ?Physical Therapist with [place] ?[facility] ?[phone] office ?[phone] mobile phone ? ?

## 2021-09-20 NOTE — Discharge Summary (Signed)
?Physician Discharge Summary ?  ?Patient: Sean Murillo MRN: [id number] DOB: 1976/01/20  ?Admit date:     09/17/2021  ?Discharge date: 09/20/21  ?Discharge Physician: Sean Merles, DO  ? ?PCP: Sean Epley, PA-C  ? ?Recommendations at discharge:  ? ?Follow-up with PCP within 1 to 2 weeks and repeat CBC, CMP, mag and Phos and have PCP adjust insulin regimen and have the PCP also follow-up on LFTs that were abnormal ?Follow-up with neurology in outpatient setting if patient has persistent headaches or any seizure-like activity for his venous cavernoma noted; will need outpatient MRI if patient becomes symptomatic ?Follow-up with outpatient diabetes coordinator ?Follow-up with general surgery in outpatient setting ? ?Discharge Diagnoses: ?Principal Problem: ?  Hyperosmolar hyperglycemic state (HHS) (HCC) ?Active Problems: ?  SIRS (systemic inflammatory response syndrome) (HCC) ?  Acute metabolic encephalopathy ?  AKI (acute kidney injury) (HCC) ?  Hyperkalemia ?  Leukocytosis ?  Morbid obesity with body mass index of 60.0-69.9 in adult [facility]) ?  Dehydration ?  Type 2 diabetes mellitus with hyperosmolar nonketotic hyperglycemia (HCC) ?  HTN (hypertension) ?  Anxiety and depression ?  PTSD (post-traumatic stress disorder) ?  Tobacco abuse ?  Hyperlipidemia ?  Chronic pain disorder ?  S/P splenectomy ?  Ventral hernia without obstruction or gangrene ?  Abnormal LFTs ?  Hypernatremia ?  Brain vascular malformation ?  Hypoalbuminemia ?  Hypokalemia ? ?Resolved Problems: ?  * No resolved hospital problems. * ? ?Hospital Course: ?HPI by Dr. Ramiro Murillo on 09/17/21 ?Sean Murillo is a 46 y.o. male with medical history significant of hypertension, morbid obesity, hyperlipidemia, depression/anxiety, status post splenectomy, PTSD, chronic low back pain presented to the ED with altered mental status.  At time of interview patient unable to answer questions appropriately or reliably and as such history obtained per ED  physician.  No family at bedside.  It is noted per ED physician that patient with altered mental status.  It is noted that patient having diarrhea about 4 to 5 days ago and over the weekend developed some emesis.  Patient noted to have developed some increased thirst with no prior history of diabetes.  It is noted that patient woke up on the morning of admission and was altered.  No fevers, no cough.  It is reported per EMS that patient noted to have sats of 87% on room air patient placed on 6 L nasal cannula and brought to the ED.  Initial CBG reading in the ED was high.  Patient states no when asked review of systems questions. ?ED Course: Patient seen in the ED noted to have significant altered mental status however protecting airway, pulling out IVs.  Vital signs on presentation noted a blood pressure of 120/72, tachycardic with heart rate as high as 133, tachypneic with a respiratory rate as high as 35, noted to have 87 6% sats on room air initially placed on 6 L nasal cannula currently on 4 L nasal cannula with sats of 94%.  ABG obtained with a pH of 7.31/PCO2 of 50/PO2 of 73.  Comprehensive metabolic profile with a sodium of 130, potassium of 5.4, chloride of 91, glucose of 1576, BUN of 28, creatinine of 1.53, calcium of 10.4, magnesium of 2.7, anion gap of 16, ALT of 59, protein of 8.5 otherwise was within normal limits.  Initial lactic acid level of 4.8 with repeat at 3.9.  CBC with a white count of 11.1 with a ANC of 9.3 otherwise was within normal limits.  INR 1.1.  Glucose of 1576.  Beta hydroxybutyric acid of 1.56.  COVID-19 PCR negative.  Influenza A and B PCR negative.  Urinalysis clear, straw-colored, glucose > 500, nitrite negative, leukocytes negative, protein negative, specific gravity 1.028.  No bacteria seen.  UDS negative.  Chest x-ray with cardiomegaly, no signs of pulmonary edema.  Increased density in both lower lung fields may be related to chest wall attenuation of pleural effusions and  underlying infiltrates.  CT head with apparent small, subcentimeter ill-defined hypodensity within the right frontal lobe.  Could potentially represent a small amount of acute hemorrhage versus artifact given streak artifact in this region.  No appreciable edema or mass effect.  Patient pancultured.  Patient given a dose of IV Rocephin.  Patient placed on the Endo tool, IV fluids, supportive care.  Repeat head CT pending for 1500 hrs. per ED physician. ?  ?**Interim History ?Patient's mental status is improving slowly and he is awake and alert and oriented.  His renal function remains elevated he was given 2 more liter lactated ringer boluses along with a 500 mL given his elevated lactic acid.  Lactic acid trended up to 4.9 is now improved and resolved at 1.8.  We will continue IV fluid hydration but change from the D5 LR to just LR at 75 MLS per hour.  Patient was transitioned off the insulin drip and he was initiated on 18 units of Semglee but this has been uptitrated to 36 units.  Renal ultrasound has been normal.  The patient is to undergo a CTA of the head for his vascular malformation noted after discussion with the neurology team yesterday. ? ?He underwent the CTA of his head and showed a faint right frontal lobe enhancement at the site of initial high density by CT likely developmental venous anomaly/cavernoma with no detectable arterial lesion but an incidental polyp in the right nasal cavity noted. ? ?Patient subsequently improved and his blood sugars are improving.  After further discussion with the diabetic coordinator he will be discharged home on Lantus 50 units subcu daily, 10 units of NovoLog 3 times daily with meals, and Ozempic 1 weekly.  He did did have some abnormal LFTs prior to discharge however this was worked up with a right upper quadrant ultrasound which showed some hepatic steatosis.  Likely his LFTs worsened in the setting of steroids.  Recommending following up within 1 week and  repeating a CMP.  He is stable for discharge at this time and ambulated well with therapy without any issues ? ?Assessment and Plan: ?* Hyperosmolar hyperglycemic state (HHS) (HCC) ?-Patient presented to the ED with acute metabolic encephalopathy/altered mental status. ?-No prior history of diabetes and always has been "Pre-Diabetic" per wife but last hemoglobin A1c 6.6 (05/24/2021) ?-Patient on presentation with ABG pH of 7.31, PCO2 of 50, PO2 of 73.  Comprehensive metabolic profile with a potassium of 5.4, sodium of 130, glucose of 1576, BUN of 28, creatinine of 1.53, anion gap of 16.  Lactic acid at 4.8.  Urinalysis with > 500 glucose, ketones negative.  UDS negative. ?-Chest x-ray with concerns for possible infiltrate in the bases.  Beta hydroxybutyric acid elevated at 1.56 and now resolved.  Afebrile. ?-Patient placed on Endo tool which we will continue for now. ?-Given 2 L normal saline bolus.  And then given 3 LR boluses given his lactic acid level still being elevated ?-N.p.o. except ice chips and sips with meds but since he is improved we will advance his diet  and now placed on carb modified diet ?-Aggressive IV fluid resuscitation. ?-Check a hemoglobin A1c and is now 12.3. ?-BMET every 4 hours, CBG every hour. ?-IV fluids changed and now have resumed at LR 75 MLS per hour.  Received 2 and half liter boluses yesterday ?-CBGs have been uncontrolled in the setting of prednisone for his scan now and have been ranging from 255-298 ?-His anion gap has closed and he has been transitioned to subcutaneous insulin and have increased to 36 units of Levemir and increased to resistant NovoLog sliding scale insulin AC and at bedtime along with 4 units of NovoLog 3 times daily AC ?-Consulted diabetes education coordinator for further evaluation and recommendations and further insulin teaching ?-Further discussion and evaluation we will send the patient home on Lantus Solostar 50 units subcu daily, 10 units of NovoLog 3  times daily with meals as well as 1 g of Ozempic weekly with a glucometer and close PCP follow-up ?-Supportive care. ? ?SIRS (systemic inflammatory response syndrome) (HCC) ?- Patient presented to the ED with alt

## 2021-09-20 NOTE — Progress Notes (Signed)
Inpatient Diabetes Program Recommendations ? ?AACE/ADA: New Consensus Statement on Inpatient Glycemic Control (2015) ? ?Target Ranges:  Prepandial:   less than 140 mg/dL ?     Peak postprandial:   less than 180 mg/dL (1-2 hours) ?     Critically ill patients:  140 - 180 mg/dL  ? ?Lab Results  ?Component Value Date  ? GLUCAP 269 (H) 09/20/2021  ? HGBA1C 12.3 (H) 09/17/2021  ? ? ?Review of Glycemic Control ? ?Inpatient Diabetes Program Recommendations:   ?Spoke with pt and wife @ bedside about new diagnosis. Discussed A1C results with them and explained what an A1C is, basic pathophysiology of DM Type 2, basic home care, basic diabetes diet nutrition principles, importance of checking CBGs and maintaining good CBG control to prevent long-term and short-term complications. Reviewed signs and symptoms of hyperglycemia and hypoglycemia and how to treat hypoglycemia at home. Also reviewed blood sugar goals at home.  ?RNs to provide ongoing basic DM education at bedside with this patient. Have ordered educational booklet, insulin starter kit, and RD consult for DM diet education. ? ?Educated patient and spouse on insulin pen use at home. Reviewed contents of insulin flexpen starter kit. Reviewed all steps if insulin pen including attachment of needle, 2-unit air shot, dialing up dose, giving injection, removing needle, disposal of sharps, storage of unused insulin, disposal of insulin etc. Patient and wife able to provide successful return demonstration. Also reviewed troubleshooting with insulin pen. MD to give patient Rxs for insulin pens and insulin pen needles.  ? ?Reviewed timing of long acting basal insulin and short acting insulin. ?Spoke with Dr. Alfredia Ferguson to give update after patient teaching. ?Patient was on Ozempic 0.5 in the past but didn't realize he needed to get a refill. ? ?Consider @ discharge: ?-Lantus 50 units qd ?-Novolog 10 units tid meal coverage ?-Ozempic restart ? ?Thank you, ?Nani Gasser Amiley Shishido, RN, MSN,  CDE  ?Diabetes Coordinator ?Inpatient Glycemic Control Team ?Team Pager [phone] (8am-5pm) ?09/20/2021 10:13 AM ? ? ? ? ?

## 2021-09-23 LAB — CULTURE, BLOOD (ROUTINE X 2)
Culture: NO GROWTH
Culture: NO GROWTH

## 2021-10-16 ENCOUNTER — Ambulatory Visit: Payer: No Typology Code available for payment source | Admitting: Nutrition

## 2021-10-16 ENCOUNTER — Telehealth: Payer: Self-pay | Admitting: Nutrition

## 2021-10-16 NOTE — Telephone Encounter (Signed)
VM left to call back and r/s missed appt. ?

## 2021-11-26 ENCOUNTER — Telehealth: Payer: Self-pay | Admitting: Nutrition

## 2021-11-26 NOTE — Telephone Encounter (Signed)
Left 2 messages to returen call to r/s appt.

## 2021-11-27 ENCOUNTER — Encounter: Payer: Medicare HMO | Attending: Family Medicine | Admitting: Nutrition

## 2021-11-27 ENCOUNTER — Encounter: Payer: Self-pay | Admitting: Nutrition

## 2021-11-27 DIAGNOSIS — E782 Mixed hyperlipidemia: Secondary | ICD-10-CM

## 2021-11-27 DIAGNOSIS — I10 Essential (primary) hypertension: Secondary | ICD-10-CM

## 2021-11-27 DIAGNOSIS — Z713 Dietary counseling and surveillance: Secondary | ICD-10-CM | POA: Diagnosis not present

## 2021-11-27 DIAGNOSIS — E1100 Type 2 diabetes mellitus with hyperosmolarity without nonketotic hyperglycemic-hyperosmolar coma (NKHHC): Secondary | ICD-10-CM

## 2021-11-27 DIAGNOSIS — R7989 Other specified abnormal findings of blood chemistry: Secondary | ICD-10-CM

## 2021-11-27 DIAGNOSIS — Z6841 Body Mass Index (BMI) 40.0 and over, adult: Secondary | ICD-10-CM | POA: Diagnosis not present

## 2021-11-27 NOTE — Progress Notes (Signed)
Medical Nutrition Therapy  Appointment Start time:  1300  Appointment End time:  1400  Primary concerns today: Dm Type 2, Severe Obesity  Referral diagnosis: E11.8, E66.01 Preferred learning style: No preference.  Learning readiness: Change in progress   NUTRITION ASSESSMENT  46 yr old male here for diabetes Type 2. Was in the ER of [facility] with hyperosmolality in March 2023. Was given some medication from  [facility] [place] and then ad reactions and had super dry mouth, tired, frequent urination and mental status changes and went to ER. Lost 30 lbs since being in the hospital. 450's down to 420's. Changes made: has cut out sugar beverages, sweets, desserts and anything with sugar in it. NO processed foods., Drinking more water. Eating more vegetables, eating at home,  Cut out snacks and not eating after supper.  Orders in the computer say he was suppose to be on 50 units of Lantus and 10 + units of Novolog. He has been only taking Lantus 5 and Novolog 1 units with meals because he didn't know he was suppose to be on that much.. BS are excellent with only 5 units of Lantus and 1 unit of bolus insulin. He may not need insulin anymore with just lifestyle medicine changes.  He Was on Ozempic for 1 month but hasn't gotten any refills since the first month. Will check with [facility], who prescribed it. Was working on having gastric bypass surgery before all this happened. Was planning on having carpel tunnel surgery also and that was postponed.   Had only 1 blood sugar in the 70's. Had  few periods where he felt shaky and weak and had to get something to eat. BS were in the low 80's. Currently FBS 99-112 and before lunch 102's and Before dinner 103-119.  Had hernia surgery last July 2  Current diet has improved as well as his blood sugars. He is at risk for hypoglycemia due to still taking insulin and BS are doing much better with no hyperglycemia.  Diet is still low in whole plant based foods, fruits, vegetables  and whole grains. Doing much better overall though.  Wiill stop insulin for 2 days and see how blood sugars respong. Anthropometrics  Wt Readings from Last 3 Encounters:  11/27/21 (!) 423 lb (191.9 kg)  09/20/21 (!) 457 lb 12.8 oz (207.7 kg)  01/28/20 (!) 420 lb (190.5 kg)   Ht Readings from Last 3 Encounters:  11/27/21 5\' 9"  (1.753 m)  09/17/21 5\' 9"  (1.753 m)  01/28/20 5\' 9"  (1.753 m)   Body mass index is 62.47 kg/m. @BMIFA @ Facility age limit for growth percentiles is 20 years. Facility age limit for growth percentiles is 20 years.    Clinical Medical Hx: GOUT, DM, HTN, Obesity, knee problems, carpal tunnel Medications: Lantus 5 units and 1 unit of Novolog with meals.  Had been on ozempic but needs a refill.  Hasn't taken ozempic due to waiting on .   Labs:  Lab Results  Component Value Date   HGBA1C 12.3 (H) 09/17/2021      Latest Ref Rng & Units 09/20/2021    5:53 AM 09/19/2021    4:03 AM 09/18/2021    5:52 PM  CMP  Glucose 70 - 99 mg/dL 09/19/2021   09/22/2021   09/21/2021    BUN 6 - 20 mg/dL 16   21   21     Creatinine 0.61 - 1.24 mg/dL 09/20/2021   786   754    Sodium 135 - 145  mmol/L 138   140   146    Potassium 3.5 - 5.1 mmol/L 3.2   4.8   3.3    Chloride 98 - 111 mmol/L 101   107   109    CO2 22 - 32 mmol/L 26   22   25     Calcium 8.9 - 10.3 mg/dL 8.1   8.2   8.7    Total Protein 6.5 - 8.1 g/dL 6.7   6.5     Total Bilirubin 0.3 - 1.2 mg/dL 0.9   1.1     Alkaline Phos 38 - 126 U/L 85   82     AST 15 - 41 U/L 93   57     ALT 0 - 44 U/L 57   45      Lipid Panel     Component Value Date/Time   CHOL 216 (H) 09/18/2021 0457   TRIG 429 (H) 09/18/2021 0457   HDL 34 (L) 09/18/2021 0457   CHOLHDL 6.4 09/18/2021 0457   VLDL UNABLE TO CALCULATE IF TRIGLYCERIDE OVER 400 mg/dL 09/20/2021 95/02/3266   LDLCALC UNABLE TO CALCULATE IF TRIGLYCERIDE OVER 400 mg/dL 1245 80/99/8338   LDLDIRECT 129.2 (H) 09/18/2021 0457    Notable Signs/Symptoms: none   Lifestyle & Dietary Hx LIve with his wife.  She does the cooking and shopping. Eats 3 meals per day Broke his back in 2007. Had 3 back surgery. In in a lot of pain.  Estimated daily fluid intake: gallon Supplements: none Sleep: 6 hours,  Stress / self-care: none Current average weekly physical activity: ADL in wheel  24-Hr Dietary Recall First Meal: Water, eggs 2, scrambled 2 slice wheat bread Snack: none Second Meal: Ham sandwich, mayonaise and water Snack:  Third Meal: Pork chop, green beans, roll, water Snack:  Beverages: water  Estimated Energy Needs Calories: 1800 Carbohydrate: 200g Protein: 135g Fat: 50g   NUTRITION DIAGNOSIS  NB-1.1 Food and nutrition-related knowledge deficit As related to Diabetes Type 2, Morbid obesity.  As evidenced by A1C 12.3 and BMI > 60.   NUTRITION INTERVENTION  Nutrition education (E-1) on the following topics:  Nutrition and Diabetes education provided on My Plate, CHO counting, meal planning, portion sizes, timing of meals, avoiding snacks between meals unless having a low blood sugar, target ranges for A1C and blood sugars, signs/symptoms and treatment of hyper/hypoglycemia, monitoring blood sugars, taking medications as prescribed, benefits of exercising 30 minutes per day and prevention of complications of DM. Lifestyle Medicine  - Whole Food, Plant Predominant Nutrition is highly recommended: Eat Plenty of vegetables, Mushrooms, fruits, Legumes, Whole Grains, Nuts, seeds in lieu of processed meats, processed snacks/pastries red meat, poultry, eggs.    -It is better to avoid simple carbohydrates including: Cakes, Sweet Desserts, Ice Cream, Soda (diet and regular), Sweet Tea, Candies, Chips, Cookies, Store Bought Juices, Alcohol in Excess of  1-2 drinks a day, Lemonade,  Artificial Sweeteners, Doughnuts, Coffee Creamers, "Sugar-free" Products, etc, etc.  This is not a complete list.....  Exercise: If you are able: 30 -60 minutes a day ,4 days a week, or 150 minutes a week.  The  longer the better.  Combine stretch, strength, and aerobic activities.  If you were told in the past that you have high risk for cardiovascular diseases, you may seek evaluation by your heart doctor prior to initiating moderate to intense exercise programs.   Handouts Provided Include  LIfestyle Medicine Handouts Meal Plan Card  Learning Style & Readiness for Change Teaching  method utilized: Special educational needs teacherVisual & Auditory  Demonstrated degree of understanding via: Teach Back  Barriers to learning/adherence to lifestyle change: none  Goals Established by Pt Goals  Stop insulins for the next 2 days and check blood sugars. Call tomorrow afternoon by 3 pm to discuss BS readings. Eat meals on time. Don't eat past 7 pm. Focus on whole plant based foods Keep drinking water. Increase activity as tolerated. Check blood sugars before meals and bedtime.   Get A1C down to 7% or below. Prevent low blood sugars.   MONITORING & EVALUATION Dietary intake, weekly physical activity, and blood sugars in 1 month.  Next Steps  Patient is to focus on whole plant based foods..Marland Kitchen

## 2021-11-27 NOTE — Patient Instructions (Addendum)
Goals  Stop insulins for the next 2 days and check blood sugars. Call tomorrow afternoon by 3 pm to discuss BS readings. Eat meals on time. Don't eat past 7 pm. Focus on whole plant based foods Keep drinking water. Increase activity as tolerated. Check blood sugars before meals and bedtime.   Get A1C down to 7% or below. Prevent low blood sugars.    Marland Kitchen

## 2022-01-23 ENCOUNTER — Encounter: Payer: No Typology Code available for payment source | Attending: Family Medicine | Admitting: Nutrition

## 2022-02-03 ENCOUNTER — Ambulatory Visit: Payer: No Typology Code available for payment source | Admitting: Nutrition

## 2022-02-20 DIAGNOSIS — L03311 Cellulitis of abdominal wall: Secondary | ICD-10-CM | POA: Diagnosis not present

## 2022-02-20 DIAGNOSIS — F17220 Nicotine dependence, chewing tobacco, uncomplicated: Secondary | ICD-10-CM | POA: Diagnosis not present

## 2022-02-20 DIAGNOSIS — K469 Unspecified abdominal hernia without obstruction or gangrene: Secondary | ICD-10-CM | POA: Diagnosis not present

## 2022-02-20 DIAGNOSIS — R630 Anorexia: Secondary | ICD-10-CM | POA: Diagnosis not present

## 2022-02-20 DIAGNOSIS — R509 Fever, unspecified: Secondary | ICD-10-CM | POA: Diagnosis not present

## 2022-02-20 DIAGNOSIS — M1990 Unspecified osteoarthritis, unspecified site: Secondary | ICD-10-CM | POA: Diagnosis not present

## 2022-02-20 DIAGNOSIS — I10 Essential (primary) hypertension: Secondary | ICD-10-CM | POA: Diagnosis not present

## 2022-02-20 DIAGNOSIS — J45909 Unspecified asthma, uncomplicated: Secondary | ICD-10-CM | POA: Diagnosis not present

## 2022-02-20 DIAGNOSIS — R109 Unspecified abdominal pain: Secondary | ICD-10-CM | POA: Diagnosis not present

## 2022-02-20 DIAGNOSIS — K439 Ventral hernia without obstruction or gangrene: Secondary | ICD-10-CM | POA: Diagnosis not present

## 2022-02-20 DIAGNOSIS — R110 Nausea: Secondary | ICD-10-CM | POA: Diagnosis not present

## 2022-02-20 DIAGNOSIS — R10813 Right lower quadrant abdominal tenderness: Secondary | ICD-10-CM | POA: Diagnosis not present

## 2022-02-20 DIAGNOSIS — R1031 Right lower quadrant pain: Secondary | ICD-10-CM | POA: Diagnosis not present

## 2022-02-20 DIAGNOSIS — Z79891 Long term (current) use of opiate analgesic: Secondary | ICD-10-CM | POA: Diagnosis not present

## 2022-02-20 DIAGNOSIS — Z79899 Other long term (current) drug therapy: Secondary | ICD-10-CM | POA: Diagnosis not present

## 2022-02-20 DIAGNOSIS — E669 Obesity, unspecified: Secondary | ICD-10-CM | POA: Diagnosis not present

## 2022-02-20 DIAGNOSIS — Z91013 Allergy to seafood: Secondary | ICD-10-CM | POA: Diagnosis not present

## 2022-02-20 DIAGNOSIS — Z91041 Radiographic dye allergy status: Secondary | ICD-10-CM | POA: Diagnosis not present

## 2022-02-21 DIAGNOSIS — K439 Ventral hernia without obstruction or gangrene: Secondary | ICD-10-CM | POA: Diagnosis not present

## 2022-03-13 DIAGNOSIS — K436 Other and unspecified ventral hernia with obstruction, without gangrene: Secondary | ICD-10-CM | POA: Diagnosis not present

## 2022-03-13 DIAGNOSIS — Z09 Encounter for follow-up examination after completed treatment for conditions other than malignant neoplasm: Secondary | ICD-10-CM | POA: Diagnosis not present

## 2022-04-11 DIAGNOSIS — M25512 Pain in left shoulder: Secondary | ICD-10-CM | POA: Diagnosis not present

## 2022-04-21 DIAGNOSIS — Z743 Need for continuous supervision: Secondary | ICD-10-CM | POA: Diagnosis not present

## 2022-04-21 DIAGNOSIS — R6889 Other general symptoms and signs: Secondary | ICD-10-CM | POA: Diagnosis not present

## 2022-04-29 DIAGNOSIS — M62838 Other muscle spasm: Secondary | ICD-10-CM | POA: Diagnosis not present

## 2022-04-29 DIAGNOSIS — Z6841 Body Mass Index (BMI) 40.0 and over, adult: Secondary | ICD-10-CM | POA: Diagnosis not present

## 2022-04-29 DIAGNOSIS — J329 Chronic sinusitis, unspecified: Secondary | ICD-10-CM | POA: Diagnosis not present

## 2022-04-29 DIAGNOSIS — Z809 Family history of malignant neoplasm, unspecified: Secondary | ICD-10-CM | POA: Diagnosis not present

## 2022-04-29 DIAGNOSIS — I10 Essential (primary) hypertension: Secondary | ICD-10-CM | POA: Diagnosis not present

## 2022-04-29 DIAGNOSIS — E1142 Type 2 diabetes mellitus with diabetic polyneuropathy: Secondary | ICD-10-CM | POA: Diagnosis not present

## 2022-04-29 DIAGNOSIS — Z794 Long term (current) use of insulin: Secondary | ICD-10-CM | POA: Diagnosis not present

## 2022-04-29 DIAGNOSIS — M1990 Unspecified osteoarthritis, unspecified site: Secondary | ICD-10-CM | POA: Diagnosis not present

## 2022-04-29 DIAGNOSIS — Z791 Long term (current) use of non-steroidal anti-inflammatories (NSAID): Secondary | ICD-10-CM | POA: Diagnosis not present

## 2022-04-29 DIAGNOSIS — M25519 Pain in unspecified shoulder: Secondary | ICD-10-CM | POA: Diagnosis not present

## 2022-04-29 DIAGNOSIS — R69 Illness, unspecified: Secondary | ICD-10-CM | POA: Diagnosis not present

## 2022-05-02 ENCOUNTER — Other Ambulatory Visit (HOSPITAL_COMMUNITY): Payer: Self-pay | Admitting: Medical

## 2022-05-02 DIAGNOSIS — M25512 Pain in left shoulder: Secondary | ICD-10-CM

## 2022-05-06 ENCOUNTER — Ambulatory Visit: Payer: No Typology Code available for payment source | Admitting: Orthopedic Surgery

## 2022-05-13 ENCOUNTER — Ambulatory Visit (HOSPITAL_COMMUNITY): Admission: RE | Admit: 2022-05-13 | Payer: No Typology Code available for payment source | Source: Ambulatory Visit

## 2022-05-23 ENCOUNTER — Emergency Department (HOSPITAL_COMMUNITY): Payer: No Typology Code available for payment source

## 2022-05-23 ENCOUNTER — Other Ambulatory Visit: Payer: Self-pay

## 2022-05-23 ENCOUNTER — Inpatient Hospital Stay (HOSPITAL_COMMUNITY)
Admission: EM | Admit: 2022-05-23 | Discharge: 2022-05-25 | DRG: 189 | Disposition: A | Discharge status: Home / Self Care | Payer: No Typology Code available for payment source | Attending: Internal Medicine | Admitting: Internal Medicine

## 2022-05-23 DIAGNOSIS — E1100 Type 2 diabetes mellitus with hyperosmolarity without nonketotic hyperglycemic-hyperosmolar coma (NKHHC): Secondary | ICD-10-CM | POA: Diagnosis present

## 2022-05-23 DIAGNOSIS — Z8619 Personal history of other infectious and parasitic diseases: Secondary | ICD-10-CM

## 2022-05-23 DIAGNOSIS — I119 Hypertensive heart disease without heart failure: Secondary | ICD-10-CM | POA: Diagnosis present

## 2022-05-23 DIAGNOSIS — Z9989 Dependence on other enabling machines and devices: Secondary | ICD-10-CM

## 2022-05-23 DIAGNOSIS — I160 Hypertensive urgency: Secondary | ICD-10-CM | POA: Diagnosis not present

## 2022-05-23 DIAGNOSIS — Z716 Tobacco abuse counseling: Secondary | ICD-10-CM

## 2022-05-23 DIAGNOSIS — Z6841 Body Mass Index (BMI) 40.0 and over, adult: Secondary | ICD-10-CM

## 2022-05-23 DIAGNOSIS — Z91198 Patient's noncompliance with other medical treatment and regimen for other reason: Secondary | ICD-10-CM

## 2022-05-23 DIAGNOSIS — I10 Essential (primary) hypertension: Secondary | ICD-10-CM | POA: Diagnosis present

## 2022-05-23 DIAGNOSIS — Z91041 Radiographic dye allergy status: Secondary | ICD-10-CM

## 2022-05-23 DIAGNOSIS — Z23 Encounter for immunization: Secondary | ICD-10-CM

## 2022-05-23 DIAGNOSIS — Z7951 Long term (current) use of inhaled steroids: Secondary | ICD-10-CM

## 2022-05-23 DIAGNOSIS — J45909 Unspecified asthma, uncomplicated: Secondary | ICD-10-CM | POA: Diagnosis present

## 2022-05-23 DIAGNOSIS — F4310 Post-traumatic stress disorder, unspecified: Secondary | ICD-10-CM | POA: Diagnosis present

## 2022-05-23 DIAGNOSIS — Z8701 Personal history of pneumonia (recurrent): Secondary | ICD-10-CM

## 2022-05-23 DIAGNOSIS — E662 Morbid (severe) obesity with alveolar hypoventilation: Secondary | ICD-10-CM | POA: Diagnosis present

## 2022-05-23 DIAGNOSIS — Z91013 Allergy to seafood: Secondary | ICD-10-CM

## 2022-05-23 DIAGNOSIS — Z794 Long term (current) use of insulin: Secondary | ICD-10-CM

## 2022-05-23 DIAGNOSIS — F32A Depression, unspecified: Secondary | ICD-10-CM | POA: Diagnosis present

## 2022-05-23 DIAGNOSIS — R600 Localized edema: Secondary | ICD-10-CM | POA: Insufficient documentation

## 2022-05-23 DIAGNOSIS — Z79899 Other long term (current) drug therapy: Secondary | ICD-10-CM

## 2022-05-23 DIAGNOSIS — Z823 Family history of stroke: Secondary | ICD-10-CM

## 2022-05-23 DIAGNOSIS — Z87442 Personal history of urinary calculi: Secondary | ICD-10-CM

## 2022-05-23 DIAGNOSIS — M1990 Unspecified osteoarthritis, unspecified site: Secondary | ICD-10-CM | POA: Diagnosis present

## 2022-05-23 DIAGNOSIS — J189 Pneumonia, unspecified organism: Secondary | ICD-10-CM | POA: Insufficient documentation

## 2022-05-23 DIAGNOSIS — J9601 Acute respiratory failure with hypoxia: Secondary | ICD-10-CM | POA: Diagnosis not present

## 2022-05-23 DIAGNOSIS — R253 Fasciculation: Secondary | ICD-10-CM | POA: Diagnosis present

## 2022-05-23 DIAGNOSIS — Z1152 Encounter for screening for COVID-19: Secondary | ICD-10-CM

## 2022-05-23 DIAGNOSIS — F17290 Nicotine dependence, other tobacco product, uncomplicated: Secondary | ICD-10-CM | POA: Diagnosis present

## 2022-05-23 DIAGNOSIS — E785 Hyperlipidemia, unspecified: Secondary | ICD-10-CM | POA: Diagnosis present

## 2022-05-23 DIAGNOSIS — Z981 Arthrodesis status: Secondary | ICD-10-CM

## 2022-05-23 LAB — CBC
HCT: 42.3 % (ref 39.0–52.0)
Hemoglobin: 14.3 g/dL (ref 13.0–17.0)
MCH: 26.9 pg (ref 26.0–34.0)
MCHC: 33.8 g/dL (ref 30.0–36.0)
MCV: 79.7 fL — ABNORMAL LOW (ref 80.0–100.0)
Platelets: 465 10*3/uL — ABNORMAL HIGH (ref 150–400)
RBC: 5.31 MIL/uL (ref 4.22–5.81)
RDW: 15.4 % (ref 11.5–15.5)
WBC: 15.2 10*3/uL — ABNORMAL HIGH (ref 4.0–10.5)
nRBC: 0 % (ref 0.0–0.2)

## 2022-05-23 LAB — URINALYSIS, ROUTINE W REFLEX MICROSCOPIC
Bacteria, UA: NONE SEEN
Bilirubin Urine: NEGATIVE
Glucose, UA: NEGATIVE mg/dL
Ketones, ur: NEGATIVE mg/dL
Leukocytes,Ua: NEGATIVE
Nitrite: NEGATIVE
Protein, ur: >=300 mg/dL — AB
Specific Gravity, Urine: 1.02 (ref 1.005–1.030)
pH: 6 (ref 5.0–8.0)

## 2022-05-23 LAB — BASIC METABOLIC PANEL
Anion gap: 9 (ref 5–15)
BUN: 15 mg/dL (ref 6–20)
CO2: 31 mmol/L (ref 22–32)
Calcium: 9 mg/dL (ref 8.9–10.3)
Chloride: 101 mmol/L (ref 98–111)
Creatinine, Ser: 0.69 mg/dL (ref 0.61–1.24)
GFR, Estimated: >60 mL/min (ref >60)
Glucose, Bld: 134 mg/dL — ABNORMAL HIGH (ref 70–99)
Potassium: 3.8 mmol/L (ref 3.5–5.1)
Sodium: 141 mmol/L (ref 135–145)

## 2022-05-23 LAB — CBG MONITORING, ED
Glucose-Capillary: 148 mg/dL — ABNORMAL HIGH (ref 70–99)
Glucose-Capillary: 116 mg/dL — ABNORMAL HIGH (ref 70–99)

## 2022-05-23 LAB — BRAIN NATRIURETIC PEPTIDE: B Natriuretic Peptide: 54 pg/mL (ref 0.0–100.0)

## 2022-05-23 MED ORDER — IOHEXOL 350 MG/ML SOLN
75.0000 mL | Freq: Once | INTRAVENOUS | Status: AC | PRN
  Administered 2022-05-23: 75 mL via INTRAVENOUS

## 2022-05-23 MED ORDER — SODIUM CHLORIDE 0.9 % IV SOLN
500.0000 mg | Freq: Once | INTRAVENOUS | Status: AC
  Administered 2022-05-24: 500 mg via INTRAVENOUS
  Filled 2022-05-23: qty 5

## 2022-05-23 MED ORDER — METHYLPREDNISOLONE SODIUM SUCC 40 MG IJ SOLR
40.0000 mg | Freq: Once | INTRAMUSCULAR | Status: AC
  Administered 2022-05-23: 40 mg via INTRAVENOUS
  Filled 2022-05-23: qty 1

## 2022-05-23 MED ORDER — DIPHENHYDRAMINE HCL 25 MG PO CAPS: 50.0000 mg | ORAL_CAPSULE | Freq: Once | ORAL | Status: AC

## 2022-05-23 MED ORDER — HYDRALAZINE HCL 20 MG/ML IJ SOLN
10.0000 mg | Freq: Once | INTRAMUSCULAR | Status: AC
  Administered 2022-05-23: 10 mg via INTRAVENOUS
  Filled 2022-05-23: qty 1

## 2022-05-23 MED ORDER — SODIUM CHLORIDE 0.9 % IV SOLN
1.0000 g | Freq: Once | INTRAVENOUS | Status: AC
  Administered 2022-05-23: 1 g via INTRAVENOUS
  Filled 2022-05-23: qty 10

## 2022-05-23 MED ORDER — DIPHENHYDRAMINE HCL 50 MG/ML IJ SOLN
50.0000 mg | Freq: Once | INTRAMUSCULAR | Status: AC
  Administered 2022-05-23: 50 mg via INTRAVENOUS
  Filled 2022-05-23: qty 1

## 2022-05-23 NOTE — ED Notes (Signed)
Pt sleeping upon entrance but awakens easily. Wife at bedside. Received report from [facility]. Pt aware awaiting benadryl around 2045 and will have CT done. Pt c/o some sob at this time. Per previous RN , pt desated to 88% when walked to restroom. Nad at this time. Pt continues 02 2L .

## 2022-05-23 NOTE — ED Provider Notes (Signed)
[facility] EMERGENCY DEPARTMENT Provider Note   CSN: [id number] Arrival date & time: 05/23/22  1102     History  Chief Complaint  Patient presents with   Hypertension   Hyperglycemia    Sean Murillo is a 46 y.o. male with history of asthma, depression, hypertension, hyperlipidemia, PTSD, sleep apnea, type 2 diabetes, morbid obesity, brain vascular malformation who presents the emergency department complaining of elevated blood pressure, elevated blood sugar, and shortness of breath for the past 2 weeks.  Patient states that he has been taking his medications as prescribed.  While checking his blood sugar at home his systolic was over 774, and his blood sugar was over 300.  He denies any chest pain, headache, or blurry vision.  Has noticed that his right leg has been swelling recently, this happens on and off since he had back surgery several years ago.  He was recently seen at the [place] and had a chest x-ray and they told him that he was diagnosed with "an enlarged heart".  Denies fever, chills, cough.   Hypertension Associated symptoms include shortness of breath. Pertinent negatives include no chest pain and no headaches.  Hyperglycemia Associated symptoms: shortness of breath   Associated symptoms: no chest pain and no fever        Home Medications Prior to Admission medications   Medication Sig Start Date End Date Taking? Authorizing Provider  fluticasone (FLONASE) 50 MCG/ACT nasal spray Place 2 sprays into both nostrils daily. 05/13/22 06/12/22 Yes [provider]  insulin aspart (NOVOLOG) 100 UNIT/ML FlexPen Inject 10 Units into the skin 3 (three) times daily with meals. 09/20/21  Yes Sheikh, Omair Latif, DO  lisinopril (ZESTRIL) 40 MG tablet Take 40 mg by mouth daily. 12/11/20  Yes [provider]  sertraline (ZOLOFT) 100 MG tablet Take 200 mg by mouth daily.    Yes [provider]  blood glucose meter kit and supplies KIT Dispense based on  patient and insurance preference. Use up to four times daily as directed. 09/20/21   Sheikh, [place], DO  cyclobenzaprine (FLEXERIL) 10 MG tablet TAKE ONE TABLET BY MOUTH TWICE A DAY AND AT BEDTIME FOR SPASMS Patient not taking: Reported on 05/23/2022 04/23/22 05/23/22  [provider]  insulin glargine (LANTUS) 100 UNIT/ML Solostar Pen Inject 50 Units into the skin daily. Patient not taking: Reported on 05/23/2022 09/20/21   Raiford Noble Latif, DO  Insulin Pen Needle (PEN NEEDLES) 31G X 8 MM MISC 1 Container by Does not apply route 4 (four) times daily -  with meals and at bedtime. 09/20/21   Sheikh, Georgina Quint Latif, DO  nicotine (NICODERM CQ - DOSED IN MG/24 HOURS) 21 mg/24hr patch Place 1 patch (21 mg total) onto the skin daily. Patient not taking: Reported on 05/23/2022 09/21/21   Raiford Noble Latif, DO  Semaglutide, 1 MG/DOSE, (OZEMPIC, 1 MG/DOSE,) 4 MG/3ML SOPN Inject 1 mg into the skin once a week. Patient not taking: Reported on 05/23/2022 09/20/21   Raiford Noble Latif, DO      Allergies    Contrast media [iodinated contrast media] and Shellfish allergy    Review of Systems   Review of Systems  Constitutional:  Negative for chills and fever.  Eyes:  Negative for visual disturbance.  Respiratory:  Positive for shortness of breath. Negative for cough.   Cardiovascular:  Positive for leg swelling. Negative for chest pain.  Neurological:  Negative for headaches.  All other systems reviewed and are negative.   Physical Exam  Updated Vital Signs BP (!) 167/125 (BP Location: Left Arm)   Pulse (!) 101   Temp 98.7 F (37.1 C) (Oral)   Resp 20   Ht _0  (1.753 m)   Wt (!) 191.9 kg   SpO2 96%   BMI 62.47 kg/m  Physical Exam Vitals and nursing note reviewed.  Constitutional:      Appearance: Normal appearance.  HENT:     Head: Normocephalic and atraumatic.  Eyes:     Conjunctiva/sclera: Conjunctivae normal.  Cardiovascular:     Rate and Rhythm: Normal rate and regular  rhythm.     Comments: Non pitting edema Pulmonary:     Effort: Pulmonary effort is normal. No respiratory distress.     Breath sounds: Normal breath sounds.  Abdominal:     General: There is no distension.     Palpations: Abdomen is soft.     Tenderness: There is no abdominal tenderness.  Musculoskeletal:     Right lower leg: 2+ Edema present.     Left lower leg: 1+ Edema present.  Skin:    General: Skin is warm and dry.  Neurological:     General: No focal deficit present.     Mental Status: He is alert.     ED Results / Procedures / Treatments   Labs (all labs ordered are listed, but only abnormal results are displayed) Labs Reviewed  BASIC METABOLIC PANEL - Abnormal; Notable for the following components:      Result Value   Glucose, Bld 134 (*)    All other components within normal limits  CBC - Abnormal; Notable for the following components:   WBC 15.2 (*)    MCV 79.7 (*)    Platelets 465 (*)    All other components within normal limits  URINALYSIS, ROUTINE W REFLEX MICROSCOPIC - Abnormal; Notable for the following components:   Hgb urine dipstick SMALL (*)    Protein, ur >=300 (*)    All other components within normal limits  CBG MONITORING, ED - Abnormal; Notable for the following components:   Glucose-Capillary 148 (*)    All other components within normal limits  CBG MONITORING, ED - Abnormal; Notable for the following components:   Glucose-Capillary 116 (*)    All other components within normal limits  BRAIN NATRIURETIC PEPTIDE  TROPONIN I (HIGH SENSITIVITY)    EKG EKG Interpretation  Date/Time:  Friday May 23 2022 13:57:13 EST Ventricular Rate:  96 PR Interval:  191 QRS Duration: 119 QT Interval:  361 QTC Calculation: 452 R Axis:   33 Text Interpretation: Sinus rhythm Nonspecific intraventricular conduction delay Inferior infarct, old Baseline wander in lead(s) V3 since last tracing no significant change Confirmed by Noemi Chapel [phone]) on  05/23/2022 4:48:01 PM  Radiology CT Angio Chest PE W and/or Wo Contrast  Result Date: 05/23/2022 CLINICAL DATA:  Pulmonary embolism suspected, high probability. Shortness of breath. Morbid obesity. EXAM: CT ANGIOGRAPHY CHEST WITH CONTRAST TECHNIQUE: Multidetector CT imaging of the chest was performed using the standard protocol during bolus administration of intravenous contrast. Multiplanar CT image reconstructions and MIPs were obtained to evaluate the vascular anatomy. RADIATION DOSE REDUCTION: This exam was performed according to the departmental dose-optimization program which includes automated exposure control, adjustment of the mA and/or kV according to patient size and/or use of iterative reconstruction technique. CONTRAST:  4m OMNIPAQUE IOHEXOL 350 MG/ML SOLN COMPARISON:  None Available. FINDINGS: Cardiovascular: The heart is enlarged and there is no pericardial effusion. The aorta is normal in  caliber. The pulmonary trunk is mildly distended, suggesting underlying pulmonary artery hypertension. No large saddle embolus. There is suboptimal opacification of the distal pulmonary arteries. Mediastinum/Nodes: No mediastinal, hilar, or axillary lymphadenopathy. The thyroid gland, trachea, and esophagus are within normal limits. Lungs/Pleura: Patchy airspace disease is noted in the left lower lobe. No effusion or pneumothorax. Upper Abdomen: Splenectomy changes are noted in the left upper quadrant. No acute abnormality. Musculoskeletal: Degenerative changes in the thoracic spine. No acute osseous abnormality Review of the MIP images confirms the above findings. IMPRESSION: 1. No large saddle embolus. There is suboptimal opacification of the distal pulmonary arteries. 2. Patchy airspace disease in the left lower lobe, possible atelectasis or infiltrate. 3. Cardiomegaly. 4. Distended pulmonary trunk which may be associated with underlying pulmonary artery hypertension. Electronically Signed   By: Brett Fairy M.D.   On: 05/23/2022 22:31   US Venous Img Lower Right (DVT Study)  Result Date: 05/23/2022 CLINICAL DATA:  Right lower extremity swelling for 2 weeks. EXAM: RIGHT LOWER EXTREMITY VENOUS DOPPLER ULTRASOUND TECHNIQUE: Gray-scale sonography with graded compression, as well as color Doppler and duplex ultrasound were performed to evaluate the lower extremity deep venous systems from the level of the common femoral vein and including the common femoral, femoral, profunda femoral, popliteal and calf veins including the posterior tibial, peroneal and gastrocnemius veins when visible. The superficial great saphenous vein was also interrogated. Spectral Doppler was utilized to evaluate flow at rest and with distal augmentation maneuvers in the common femoral, femoral and popliteal veins. COMPARISON:  None Available. FINDINGS: Contralateral Common Femoral Vein: Respiratory phasicity is normal and symmetric with the symptomatic side. No evidence of thrombus. Normal compressibility. Common Femoral Vein: No evidence of thrombus. Normal compressibility, respiratory phasicity and response to augmentation. Saphenofemoral Junction: No evidence of thrombus. Normal compressibility and flow on color Doppler imaging. Profunda Femoral Vein: No evidence of thrombus. Normal compressibility and flow on color Doppler imaging. Femoral Vein: No evidence of thrombus. Normal compressibility, respiratory phasicity and response to augmentation. Popliteal Vein: No evidence of thrombus. Normal compressibility, respiratory phasicity and response to augmentation. Calf Veins: No evidence of thrombus. Normal compressibility and flow on color Doppler imaging. Superficial Great Saphenous Vein: No evidence of thrombus. Normal compressibility. Venous Reflux:  None. Other Findings:  None. IMPRESSION: Study limited by patient body habitus. Within this limitation, no evidence of deep venous thrombosis. Electronically Signed   By: Misty Stanley  M.D.   On: 05/23/2022 15:49   DG Chest 2 View  Result Date: 05/23/2022 CLINICAL DATA:  Shortness of breath for 2 weeks. Hypertension and hyperglycemia. EXAM: CHEST - 2 VIEW COMPARISON:  09/18/2021 FINDINGS: Heart is enlarged but stable compared to prior study. LEFT lung base is not well evaluated due to technique/patient body habitus. RIGHT lung is clear. IMPRESSION: 1. Stable cardiomegaly. 2. LEFT lung base is not well evaluated. Electronically Signed   By: Nolon Nations M.D.   On: 05/23/2022 15:11    Procedures Procedures    Medications Ordered in ED Medications  cefTRIAXone (ROCEPHIN) 1 g in sodium chloride 0.9 % 100 mL IVPB (has no administration in time range)  azithromycin (ZITHROMAX) 500 mg in sodium chloride 0.9 % 250 mL IVPB (has no administration in time range)  hydrALAZINE (APRESOLINE) injection 10 mg (has no administration in time range)  methylPREDNISolone sodium succinate (SOLU-MEDROL) 40 mg/mL injection 40 mg (40 mg Intravenous Given 05/23/22 1748)  diphenhydrAMINE (BENADRYL) capsule 50 mg ( Oral See Alternative 05/23/22 2040)    Or  diphenhydrAMINE (BENADRYL) injection 50 mg (50 mg Intravenous Given 05/23/22 2040)  iohexol (OMNIPAQUE) 350 MG/ML injection 75 mL (75 mLs Intravenous Contrast Given 05/23/22 2209)    ED Course/ Medical Decision Making/ A&P Clinical Course as of 05/23/22 2315  Fri May 23, 2022  1422 Nursing staff made me aware that patient dropped to 87% on room air and was complaining of shortness of breath. Placed on 2 L nasal cannula, returned to 97%. [LR]  1727 Reevaluated the patient with oxygen turned off, maintained normal saturation at 96%, but still tachypneic and tachycardic. [LR]  2244 Reevaluated the patient with oxygen turned off, dropped to 90% and tachycardic to 114 bpm. [LR]    Clinical Course User Index [LR] Jud Fanguy, Cecille Aver, PA-C                           Medical Decision Making Amount and/or Complexity of Data Reviewed Labs:  ordered. Radiology: ordered.  Risk Prescription drug management. Decision regarding hospitalization.  This patient is a 46 y.o. male  who presents to the ED for concern of shortness of breath x 2 weeks.   Differential diagnoses prior to evaluation: The emergent differential diagnosis includes, but is not limited to,  CHF, pericardial effusion/tamponade, arrhythmias, ACS, COPD, asthma, bronchitis, pneumonia, pneumothorax, PE, anemia. This is not an exhaustive differential.   Past Medical History / Co-morbidities: asthma, depression, hypertension, hyperlipidemia, PTSD, sleep apnea, type 2 diabetes, morbid obesity, brain vascular malformation   Additional history: Chart reviewed. Pertinent results include: No echocardiogram on file  Physical Exam: Physical exam performed. The pertinent findings include: Hypertensive to 168/112. Placed on 2 L [place] after saturation 87% on room air. Increased work of breathing but clear lung sounds. Right 2+ non-pitting edema compared to left leg which is 1+.   Lab Tests/Imaging studies: I personally interpreted labs/imaging and the pertinent results include:  leukocytosis of 15.2, CMP unremarkable. Urinalysis with > 300 protein, no infection. BNP 54.  Chest x-ray with cardiomegaly, and poor visualization of left lung base.  DVT study of right leg negative.  CT angio with no emboli, but patchy airspace disease in the left lower lobe concerning for infiltrate.  I agree with the radiologist interpretation.  Cardiac monitoring: EKG obtained and interpreted by my attending physician which shows: sinus rhythm   Medications: I ordered medication including antibiotics for pneumonia.  I have reviewed the patients home medicines and have made adjustments as needed.  Consultations obtained: I consulted with hospitalist Dr Clearence Ped who will admit.    Disposition: After consideration of the diagnostic results and the patients response to treatment, I feel that  patient would benefit from admission for CAP in the setting of new oxygen requirement.  I discussed this case with my attending physician Dr. Sabra Heck who cosigned this note including patient's presenting symptoms, physical exam, and planned diagnostics and interventions. Attending physician stated agreement with plan or made changes to plan which were implemented.   Final Clinical Impression(s) / ED Diagnoses Final diagnoses:  Pneumonia of left lower lobe due to infectious organism    Rx / DC Orders ED Discharge Orders     None      Portions of this report may have been transcribed using voice recognition software. Every effort was made to ensure accuracy; however, inadvertent computerized transcription errors may be present.    Estill Cotta 05/23/22 2315    Milton Ferguson, MD 05/24/22 [phone]

## 2022-05-23 NOTE — ED Notes (Signed)
Patient transported to radiology

## 2022-05-23 NOTE — ED Triage Notes (Signed)
Pt, from home, c/o hypertension and hyperglycemia x 2 weeks.  Pt reports medication compliance.  Sts BP over 200 this morning and CBG>300.  Denies chest pain.

## 2022-05-23 NOTE — ED Triage Notes (Signed)
Pt brought in by [facility] for mutlple complaints  Pt has HTN 165/134 Cbg 301  Pt c/o right leg swelling and an overall not feeling well x 2 weeks  20G in L AC

## 2022-05-24 ENCOUNTER — Encounter (HOSPITAL_COMMUNITY): Payer: Self-pay | Admitting: Family Medicine

## 2022-05-24 ENCOUNTER — Inpatient Hospital Stay (HOSPITAL_COMMUNITY): Payer: No Typology Code available for payment source

## 2022-05-24 DIAGNOSIS — J189 Pneumonia, unspecified organism: Secondary | ICD-10-CM | POA: Diagnosis present

## 2022-05-24 DIAGNOSIS — J45909 Unspecified asthma, uncomplicated: Secondary | ICD-10-CM | POA: Diagnosis present

## 2022-05-24 DIAGNOSIS — E1100 Type 2 diabetes mellitus with hyperosmolarity without nonketotic hyperglycemic-hyperosmolar coma (NKHHC): Secondary | ICD-10-CM | POA: Diagnosis present

## 2022-05-24 DIAGNOSIS — R253 Fasciculation: Secondary | ICD-10-CM | POA: Diagnosis present

## 2022-05-24 DIAGNOSIS — I10 Essential (primary) hypertension: Secondary | ICD-10-CM

## 2022-05-24 DIAGNOSIS — F32A Depression, unspecified: Secondary | ICD-10-CM | POA: Diagnosis present

## 2022-05-24 DIAGNOSIS — Z794 Long term (current) use of insulin: Secondary | ICD-10-CM | POA: Diagnosis not present

## 2022-05-24 DIAGNOSIS — F4310 Post-traumatic stress disorder, unspecified: Secondary | ICD-10-CM | POA: Diagnosis present

## 2022-05-24 DIAGNOSIS — E785 Hyperlipidemia, unspecified: Secondary | ICD-10-CM | POA: Diagnosis present

## 2022-05-24 DIAGNOSIS — R600 Localized edema: Secondary | ICD-10-CM | POA: Diagnosis present

## 2022-05-24 DIAGNOSIS — I119 Hypertensive heart disease without heart failure: Secondary | ICD-10-CM | POA: Diagnosis present

## 2022-05-24 DIAGNOSIS — Z1152 Encounter for screening for COVID-19: Secondary | ICD-10-CM | POA: Diagnosis not present

## 2022-05-24 DIAGNOSIS — Z91013 Allergy to seafood: Secondary | ICD-10-CM | POA: Diagnosis not present

## 2022-05-24 DIAGNOSIS — Z9989 Dependence on other enabling machines and devices: Secondary | ICD-10-CM | POA: Diagnosis not present

## 2022-05-24 DIAGNOSIS — Z6841 Body Mass Index (BMI) 40.0 and over, adult: Secondary | ICD-10-CM | POA: Diagnosis not present

## 2022-05-24 DIAGNOSIS — J9601 Acute respiratory failure with hypoxia: Secondary | ICD-10-CM | POA: Diagnosis present

## 2022-05-24 DIAGNOSIS — Z91198 Patient's noncompliance with other medical treatment and regimen for other reason: Secondary | ICD-10-CM | POA: Diagnosis not present

## 2022-05-24 DIAGNOSIS — Z23 Encounter for immunization: Secondary | ICD-10-CM | POA: Diagnosis not present

## 2022-05-24 DIAGNOSIS — E662 Morbid (severe) obesity with alveolar hypoventilation: Secondary | ICD-10-CM | POA: Diagnosis present

## 2022-05-24 DIAGNOSIS — Z91041 Radiographic dye allergy status: Secondary | ICD-10-CM | POA: Diagnosis not present

## 2022-05-24 DIAGNOSIS — F17290 Nicotine dependence, other tobacco product, uncomplicated: Secondary | ICD-10-CM | POA: Diagnosis present

## 2022-05-24 DIAGNOSIS — I160 Hypertensive urgency: Secondary | ICD-10-CM | POA: Diagnosis not present

## 2022-05-24 DIAGNOSIS — M1990 Unspecified osteoarthritis, unspecified site: Secondary | ICD-10-CM | POA: Diagnosis present

## 2022-05-24 DIAGNOSIS — Z87442 Personal history of urinary calculi: Secondary | ICD-10-CM | POA: Diagnosis not present

## 2022-05-24 DIAGNOSIS — Z823 Family history of stroke: Secondary | ICD-10-CM | POA: Diagnosis not present

## 2022-05-24 LAB — COMPREHENSIVE METABOLIC PANEL
ALT: 36 U/L (ref 0–44)
AST: 24 U/L (ref 15–41)
Albumin: 3.3 g/dL — ABNORMAL LOW (ref 3.5–5.0)
Alkaline Phosphatase: 61 U/L (ref 38–126)
Anion gap: 9 (ref 5–15)
BUN: 15 mg/dL (ref 6–20)
CO2: 29 mmol/L (ref 22–32)
Calcium: 9 mg/dL (ref 8.9–10.3)
Chloride: 103 mmol/L (ref 98–111)
Creatinine, Ser: 0.6 mg/dL — ABNORMAL LOW (ref 0.61–1.24)
GFR, Estimated: >60 mL/min (ref >60)
Glucose, Bld: 128 mg/dL — ABNORMAL HIGH (ref 70–99)
Potassium: 3.6 mmol/L (ref 3.5–5.1)
Sodium: 141 mmol/L (ref 135–145)
Total Bilirubin: 0.5 mg/dL (ref 0.3–1.2)
Total Protein: 7.7 g/dL (ref 6.5–8.1)

## 2022-05-24 LAB — CBC WITH DIFFERENTIAL/PLATELET
Abs Immature Granulocytes: 0.06 10*3/uL (ref 0.00–0.07)
Basophils Absolute: 0.1 10*3/uL (ref 0.0–0.1)
Basophils Relative: 1 %
Eosinophils Absolute: 0 10*3/uL (ref 0.0–0.5)
Eosinophils Relative: 0 %
HCT: 40.2 % (ref 39.0–52.0)
Hemoglobin: 13.7 g/dL (ref 13.0–17.0)
Immature Granulocytes: 0 %
Lymphocytes Relative: 22 %
Lymphs Abs: 3.2 10*3/uL (ref 0.7–4.0)
MCH: 26.8 pg (ref 26.0–34.0)
MCHC: 34.1 g/dL (ref 30.0–36.0)
MCV: 78.5 fL — ABNORMAL LOW (ref 80.0–100.0)
Monocytes Absolute: 1.2 10*3/uL — ABNORMAL HIGH (ref 0.1–1.0)
Monocytes Relative: 8 %
Neutro Abs: 10 10*3/uL — ABNORMAL HIGH (ref 1.7–7.7)
Neutrophils Relative %: 69 %
Platelets: 464 10*3/uL — ABNORMAL HIGH (ref 150–400)
RBC: 5.12 MIL/uL (ref 4.22–5.81)
RDW: 15.3 % (ref 11.5–15.5)
WBC: 14.6 10*3/uL — ABNORMAL HIGH (ref 4.0–10.5)
nRBC: 0 % (ref 0.0–0.2)

## 2022-05-24 LAB — BLOOD GAS, VENOUS
Acid-Base Excess: 7.9 mmol/L — ABNORMAL HIGH (ref 0.0–2.0)
Bicarbonate: 35.9 mmol/L — ABNORMAL HIGH (ref 20.0–28.0)
Drawn by: 7049
FIO2: 28 %
O2 Saturation: 68.1 %
Patient temperature: 36.5
pCO2, Ven: 64 mmHg — ABNORMAL HIGH (ref 44–60)
pH, Ven: 7.36 (ref 7.25–7.43)
pO2, Ven: 38 mmHg (ref 32–45)

## 2022-05-24 LAB — CBG MONITORING, ED: Glucose-Capillary: 128 mg/dL — ABNORMAL HIGH (ref 70–99)

## 2022-05-24 LAB — GLUCOSE, CAPILLARY
Glucose-Capillary: 119 mg/dL — ABNORMAL HIGH (ref 70–99)
Glucose-Capillary: 154 mg/dL — ABNORMAL HIGH (ref 70–99)
Glucose-Capillary: 114 mg/dL — ABNORMAL HIGH (ref 70–99)

## 2022-05-24 LAB — PROCALCITONIN: Procalcitonin: 0.18 ng/mL

## 2022-05-24 LAB — TROPONIN I (HIGH SENSITIVITY)
Troponin I (High Sensitivity): 24 ng/L — ABNORMAL HIGH (ref <18)
Troponin I (High Sensitivity): 20 ng/L — ABNORMAL HIGH (ref <18)

## 2022-05-24 LAB — SARS CORONAVIRUS 2 BY RT PCR: SARS Coronavirus 2 by RT PCR: NEGATIVE

## 2022-05-24 LAB — MAGNESIUM: Magnesium: 2.4 mg/dL (ref 1.7–2.4)

## 2022-05-24 MED ORDER — INFLUENZA VAC SPLIT QUAD 0.5 ML IM SUSY
0.5000 mL | PREFILLED_SYRINGE | INTRAMUSCULAR | Status: AC
  Administered 2022-05-25: 0.5 mL via INTRAMUSCULAR
  Filled 2022-05-24: qty 0.5

## 2022-05-24 MED ORDER — MORPHINE SULFATE (PF) 2 MG/ML IV SOLN
2.0000 mg | INTRAVENOUS | Status: DC | PRN
  Administered 2022-05-24: 2 mg via INTRAVENOUS
  Filled 2022-05-24: qty 1

## 2022-05-24 MED ORDER — INSULIN GLARGINE-YFGN 100 UNIT/ML ~~LOC~~ SOLN
35.0000 [IU] | Freq: Every day | SUBCUTANEOUS | Status: DC
  Filled 2022-05-24 (×2): qty 0.35

## 2022-05-24 MED ORDER — ONDANSETRON HCL 4 MG/2ML IJ SOLN
4.0000 mg | Freq: Four times a day (QID) | INTRAMUSCULAR | Status: DC | PRN
  Administered 2022-05-24: 4 mg via INTRAVENOUS
  Filled 2022-05-24: qty 2

## 2022-05-24 MED ORDER — ACETAMINOPHEN 650 MG RE SUPP: 650.0000 mg | Freq: Four times a day (QID) | RECTAL | Status: DC | PRN

## 2022-05-24 MED ORDER — INSULIN ASPART 100 UNIT/ML IJ SOLN
0.0000 [IU] | Freq: Three times a day (TID) | INTRAMUSCULAR | Status: DC
  Administered 2022-05-24: 2 [IU] via SUBCUTANEOUS
  Administered 2022-05-24: 3 [IU] via SUBCUTANEOUS
  Administered 2022-05-25: 2 [IU] via SUBCUTANEOUS
  Filled 2022-05-24: qty 1

## 2022-05-24 MED ORDER — ONDANSETRON HCL 4 MG PO TABS: 4.0000 mg | ORAL_TABLET | Freq: Four times a day (QID) | ORAL | Status: DC | PRN

## 2022-05-24 MED ORDER — OXYCODONE HCL 5 MG PO TABS
5.0000 mg | ORAL_TABLET | ORAL | Status: DC | PRN
  Administered 2022-05-24 – 2022-05-25 (×3): 5 mg via ORAL
  Filled 2022-05-24 (×3): qty 1

## 2022-05-24 MED ORDER — INSULIN ASPART 100 UNIT/ML IJ SOLN: 0.0000 [IU] | Freq: Every day | INTRAMUSCULAR | Status: DC

## 2022-05-24 MED ORDER — LABETALOL HCL 5 MG/ML IV SOLN: 20.0000 mg | INTRAVENOUS | Status: DC | PRN

## 2022-05-24 MED ORDER — SERTRALINE HCL 50 MG PO TABS
200.0000 mg | ORAL_TABLET | Freq: Every day | ORAL | Status: DC
  Administered 2022-05-24 – 2022-05-25 (×2): 200 mg via ORAL
  Filled 2022-05-24 (×2): qty 4

## 2022-05-24 MED ORDER — MORPHINE SULFATE (PF) 2 MG/ML IV SOLN: 2.0000 mg | INTRAVENOUS | Status: DC | PRN

## 2022-05-24 MED ORDER — METOPROLOL TARTRATE 5 MG/5ML IV SOLN
5.0000 mg | Freq: Once | INTRAVENOUS | Status: AC
  Administered 2022-05-24: 5 mg via INTRAVENOUS
  Filled 2022-05-24: qty 5

## 2022-05-24 MED ORDER — SODIUM CHLORIDE 0.9 % IV SOLN
2.0000 g | INTRAVENOUS | Status: DC
  Administered 2022-05-24: 2 g via INTRAVENOUS
  Filled 2022-05-24: qty 20

## 2022-05-24 MED ORDER — IPRATROPIUM-ALBUTEROL 0.5-2.5 (3) MG/3ML IN SOLN: 3.0000 mL | Freq: Four times a day (QID) | RESPIRATORY_TRACT | Status: DC | PRN

## 2022-05-24 MED ORDER — ALBUTEROL SULFATE (2.5 MG/3ML) 0.083% IN NEBU: 2.5000 mg | INHALATION_SOLUTION | RESPIRATORY_TRACT | Status: DC | PRN

## 2022-05-24 MED ORDER — AMLODIPINE BESYLATE 5 MG PO TABS
10.0000 mg | ORAL_TABLET | Freq: Every day | ORAL | Status: DC
  Administered 2022-05-24 – 2022-05-25 (×2): 10 mg via ORAL
  Filled 2022-05-24 (×2): qty 2

## 2022-05-24 MED ORDER — ACETAMINOPHEN 325 MG PO TABS: 650.0000 mg | ORAL_TABLET | Freq: Four times a day (QID) | ORAL | Status: DC | PRN

## 2022-05-24 MED ORDER — NICOTINE 21 MG/24HR TD PT24
21.0000 mg | MEDICATED_PATCH | Freq: Every day | TRANSDERMAL | Status: DC
  Administered 2022-05-24: 21 mg via TRANSDERMAL
  Filled 2022-05-24 (×2): qty 1

## 2022-05-24 MED ORDER — LISINOPRIL 10 MG PO TABS
40.0000 mg | ORAL_TABLET | Freq: Every day | ORAL | Status: DC
  Administered 2022-05-24 – 2022-05-25 (×3): 40 mg via ORAL
  Filled 2022-05-24 (×3): qty 4

## 2022-05-24 MED ORDER — SODIUM CHLORIDE 0.9 % IV SOLN
500.0000 mg | INTRAVENOUS | Status: DC
  Administered 2022-05-24: 500 mg via INTRAVENOUS
  Filled 2022-05-24 (×2): qty 5

## 2022-05-24 MED ORDER — HYDRALAZINE HCL 20 MG/ML IJ SOLN
10.0000 mg | Freq: Four times a day (QID) | INTRAMUSCULAR | Status: DC | PRN
  Administered 2022-05-24: 10 mg via INTRAVENOUS
  Filled 2022-05-24: qty 1

## 2022-05-24 MED ORDER — HEPARIN SODIUM (PORCINE) 5000 UNIT/ML IJ SOLN
5000.0000 [IU] | Freq: Three times a day (TID) | INTRAMUSCULAR | Status: DC
  Administered 2022-05-24 – 2022-05-25 (×5): 5000 [IU] via SUBCUTANEOUS
  Filled 2022-05-24 (×5): qty 1
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
## 2022-05-24 NOTE — Assessment & Plan Note (Addendum)
-   Ultrasound DVT is negative but limited by body habitus - CTA chest shows no large saddle embolus -Continue treatment for pneumonia -Patient has been advised to follow heart healthy/low-sodium diet.

## 2022-05-24 NOTE — Assessment & Plan Note (Signed)
-   Resume home hypoglycemic regimen -Modified carbohydrate diet discussed with patient -Continue close monitoring of patient's CBGs/A1c with further adjustment to hypoglycemia regimen as required.

## 2022-05-24 NOTE — ED Notes (Addendum)
Spoke with Wallace Cullens, RN charge nurse on 3rd floor- updated on pt status, current BP, and medication given to lower BP that has not really helped- no other orders present to be given to reduce BP- no new orders received from admitting physician other than previous orders that have been completed. Bree informed floor is unable to take pt with BP in triple digits- diastolic has to be less than triple digits.

## 2022-05-24 NOTE — ED Notes (Signed)
BP 192/128--MD made aware

## 2022-05-24 NOTE — ED Notes (Signed)
Pt spouse brought pt [employer] and [employer]

## 2022-05-24 NOTE — Assessment & Plan Note (Signed)
-   BMI 62.47 - Patient is currently undergoing eval for bariatric surgery - Advised on the importance of eating nutrient dense foods and counting calories

## 2022-05-24 NOTE — ED Notes (Signed)
Pt ambulatory to bathroom and back to bed without assistance.

## 2022-05-24 NOTE — Progress Notes (Signed)
Patient seen and examined; admitted after midnight secondary to acute respiratory failure with hypoxia in the setting of community-acquired pneumonia.  Patient morbidly obese with most likely component of hypoventilation syndrome at baseline; he chronically uses CPAP nightly for obstructive sleep apnea.  Elevated blood pressure but hemodynamically stable currently; continue current IV antibiotics, supportive care and bronchodilator management.  Please refer to H&P written by Dr.Zierle-Ghosh on 05/24/22 for further info/details on admission.  Plan: -Continue current IV antibiotic -Continue as needed bronchodilators and the use of flutter valve -Follow culture results and clinical response -Wean off oxygen supplementation as tolerated -Continue treatment for blood pressure and follow-up vital signs. -CPAP QHS re-ordered  Sean Loll MD [phone]

## 2022-05-24 NOTE — ED Notes (Signed)
Dr. Z at bedside.

## 2022-05-24 NOTE — Assessment & Plan Note (Addendum)
-   Dropping down into the 80s with minimal exertion - Requiring 2 L nasal cannula to maintain oxygen sats - Use of accessory muscles, but patient reports that that is chronic - Speaking in half sentences - CTA ruled out large saddle embolus, and indicates there is an infiltrate in the left lower lobe - Treat underlying community-acquired pneumonia - Wean off O2 as tolerated - Albuterol as needed - Continue to monitor

## 2022-05-24 NOTE — ED Notes (Addendum)
Pt says he has already had dose of daily lisinopril prior to arrival- Dr Herma Carson made aware. New orders received via secure chat to give an additional dose of lisinopril

## 2022-05-24 NOTE — ED Notes (Signed)
Nurse to room- pt oxygen was around 91%, however RR had increased with more accessory muscle use- asked pt if he could tell that he was breathing faster or harder, reports he could tell a little, agreed to put O2 back on via [place]

## 2022-05-24 NOTE — ED Notes (Signed)
Breakfast tray given. °

## 2022-05-24 NOTE — ED Notes (Signed)
Pt took off oxygen, Dr Herma Carson was in room and aware that pt is not wearing Oxygen, pt agrees that if oxygen levels drop or he feels sob he will put [place] back on.

## 2022-05-24 NOTE — Assessment & Plan Note (Signed)
-   White blood cell count 15.2, tachypneic to 24 - CTA shows left lower lobe infiltrate; no pulmonary embolism. -Treatment with antibiotics using Augmentin has been recommended at discharge; 7 more days pending. -Flutter valve, antitussive, mucolytic's and oxygen supplementation provided at discharge -Repeat chest x-ray in 6 weeks to assure complete resolution of infiltrates.

## 2022-05-24 NOTE — ED Notes (Addendum)
Pt BP still elevated after hydralazine- Dr Carren Rang aware- order received for lisinopril PO

## 2022-05-24 NOTE — Assessment & Plan Note (Signed)
-   With hypertensive urgency - With blood pressures as high as 187/134 - Proteinuria as well - Hydralazine did not touch the blood pressure - Restarted home p.o. blood pressure medication lisinopril - Blood pressure still remain high and systolic increased to 200, with slight tachycardia in the low 100s, metoprolol ordered - Continue to monitor

## 2022-05-25 DIAGNOSIS — E1100 Type 2 diabetes mellitus with hyperosmolarity without nonketotic hyperglycemic-hyperosmolar coma (NKHHC): Secondary | ICD-10-CM | POA: Diagnosis not present

## 2022-05-25 DIAGNOSIS — J189 Pneumonia, unspecified organism: Secondary | ICD-10-CM | POA: Diagnosis not present

## 2022-05-25 DIAGNOSIS — Z720 Tobacco use: Secondary | ICD-10-CM

## 2022-05-25 DIAGNOSIS — J9601 Acute respiratory failure with hypoxia: Secondary | ICD-10-CM | POA: Diagnosis not present

## 2022-05-25 LAB — BASIC METABOLIC PANEL
Anion gap: 9 (ref 5–15)
BUN: 17 mg/dL (ref 6–20)
CO2: 32 mmol/L (ref 22–32)
Calcium: 8.7 mg/dL — ABNORMAL LOW (ref 8.9–10.3)
Chloride: 99 mmol/L (ref 98–111)
Creatinine, Ser: 0.61 mg/dL (ref 0.61–1.24)
GFR, Estimated: >60 mL/min (ref >60)
Glucose, Bld: 100 mg/dL — ABNORMAL HIGH (ref 70–99)
Potassium: 3.9 mmol/L (ref 3.5–5.1)
Sodium: 140 mmol/L (ref 135–145)

## 2022-05-25 LAB — GLUCOSE, CAPILLARY
Glucose-Capillary: 112 mg/dL — ABNORMAL HIGH (ref 70–99)
Glucose-Capillary: 132 mg/dL — ABNORMAL HIGH (ref 70–99)

## 2022-05-25 LAB — STREP PNEUMONIAE URINARY ANTIGEN: Strep Pneumo Urinary Antigen: NEGATIVE

## 2022-05-25 MED ORDER — NICOTINE 21 MG/24HR TD PT24: 21.0000 mg | MEDICATED_PATCH | Freq: Every day | TRANSDERMAL | 2 refills | Status: DC

## 2022-05-25 MED ORDER — AMLODIPINE BESYLATE 10 MG PO TABS: 10.0000 mg | ORAL_TABLET | Freq: Every day | ORAL | 1 refills | Status: DC

## 2022-05-25 MED ORDER — DM-GUAIFENESIN ER 30-600 MG PO TB12: 1.0000 | ORAL_TABLET | Freq: Two times a day (BID) | ORAL | 0 refills | Status: AC

## 2022-05-25 MED ORDER — AMOXICILLIN-POT CLAVULANATE 875-125 MG PO TABS: 1.0000 | ORAL_TABLET | Freq: Two times a day (BID) | ORAL | 0 refills | Status: AC

## 2022-05-25 NOTE — Progress Notes (Signed)
Patient states understanding of discharge instructions.  

## 2022-05-25 NOTE — TOC Transition Note (Signed)
Transition of Care [facility]) - CM/SW Discharge Note   Patient Details  Name: Sean Murillo MRN: [id number] Date of Birth: 07-12-1975  [facility] CM/SW Contact:  Villa Herb, LCSWA Phone Number: 05/25/2022, 2:01 PM   Clinical Narrative:    CSW updated that pt needs home O2 set up prior to D/C. CSW spoke with pt who does not have a preference in DME company. Pt would like to use his Medicare benefits for home O2. CSW spoke to [place] with [employer] who accepts pts referral. Home O2 tank has been delivered to pts room and number for pt/spouse to call when discharging has been given. CSW updated RN that O2 is in room. Pt and spouse plan to call and update [facility] on [facility]. TOC signing off.    Final next level of care: Home/Self Care Barriers to Discharge: Barriers Resolved   Patient Goals and CMS Choice Patient states their goals for this hospitalization and ongoing recovery are:: return home CMS Medicare.gov Compare Post Acute Care list provided to:: Patient Choice offered to / list presented to : Patient  Discharge Placement                       Discharge Plan and Services                DME Arranged: Oxygen DME Agency: Patsy Lager Date DME Agency Contacted: 05/25/22   Representative spoke with at DME Agency: Morrie Sheldon            Social Determinants of Health (SDOH) Interventions     Readmission Risk Interventions     No data to display

## 2022-05-25 NOTE — Discharge Summary (Signed)
Physician Discharge Summary   Patient: Sean Murillo MRN: [id number] DOB: [birth year]  Admit date:     05/23/2022  Discharge date: 05/25/22  Discharge Physician: Barton Dubois   PCP: [facility]   Recommendations at discharge:  Reassess blood pressure and adjust antihypertensive regimen as needed. Repeat chest x-ray in 6 weeks to assure complete resolution of infiltrates. Continue assisting patient with weight loss management and tobacco cessation. Continue close monitoring of patient's CBGs/A1c with further adjustment to hypoglycemia regimen as required.   Discharge Diagnoses: Principal Problem:   Acute respiratory failure with hypoxia (HCC) Active Problems:   Morbid obesity with body mass index of 60.0-69.9 in adult [facility])   Type 2 diabetes mellitus with hyperosmolar nonketotic hyperglycemia (HCC)   HTN (hypertension)   CAP (community acquired pneumonia)   Unilateral edema of lower extremity Tobacco abuse  Hospital Course: Sean Murillo is a 46 y.o. male with medical history significant of arthritis, asthma, depression, hypertension, PTSD, [place] fever, diabetes mellitus type 2, and more presents to the ED with a chief complaint of dyspnea.  Patient reports that it was gradual in onset and started 2 weeks ago.  It is worse on exertion.  Used to build to walk down the hall at his house but now he has to use a scooter to go from room to room because he gets too short of breath.  He is speaking in 3-4 word phrases.  He denies cough, fever, chest pain, palpitations.  He does admit to peripheral edema which started last night.  Has had some muscle twitching that is been new for him, which started 2 weeks ago.  Patient reports orthopnea, unclear when that started.  He reports he normally sleeps on the side of his stomach without a problem, now he is having to stay propped up.  The complaint he has at this time is he was hyperglycemic at home.  He reports his glucose was  300 at home, but by the time he arrived at the hospital with EMS it was 134.  There was no intervention in between.  It is possible the patient's glucometer needs to be calibrated.  Patient reports no other complaints at this time.   Patient does not smoke, does not drink alcohol, does not use illicit drugs.  He is vaccinated for COVID.  Patient is full code.  Assessment and Plan: * Acute respiratory failure with hypoxia (HCC) - Dropping down into the 80s with exertion -Excellent response to the use of 2 L nasal cannula supplementation -Patient most likely with component of obesity hypoventilation syndrome. -No fever, normalization's of his WBCs and otherwise hemodynamically stable and feeling ready to go home. -Continue antibiotic therapy and wean off oxygen supplementation as tolerated. -Patient instructed to continue the use of flutter valve and mucolytic's/antitussive medication.  Unilateral edema of lower extremity - Ultrasound DVT is negative but limited by body habitus - CTA chest shows no large saddle embolus -Continue treatment for pneumonia -Patient has been advised to follow heart healthy/low-sodium diet.  CAP (community acquired pneumonia) - White blood cell count 15.2, tachypneic to 24 - CTA shows left lower lobe infiltrate; no pulmonary embolism. -Treatment with antibiotics using Augmentin has been recommended at discharge; 7 more days pending. -Flutter valve, antitussive, mucolytic's and oxygen supplementation provided at discharge -Repeat chest x-ray in 6 weeks to assure complete resolution of infiltrates.  HTN (hypertension) - With hypertensive urgency in the setting of missing the use of CPAP overnight at time of admission and no  having his home antihypertensive agents medications. -Home meds resumed; blood pressure still running high and patient started on Norvasc. -Vital signs of stabilized at time of discharge -Reassess blood pressure and further adjust  antihypertensive medications -Heart healthy/low-sodium diet discussed with patient.  Type 2 diabetes mellitus with hyperosmolar nonketotic hyperglycemia (HCC) - Resume home hypoglycemic regimen -Modified carbohydrate diet discussed with patient -Continue close monitoring of patient's CBGs/A1c with further adjustment to hypoglycemia regimen as required.  Morbid obesity with body mass index of 60.0-69.9 in adult (HCC) - BMI 62.47 - Low calorie diet, portion control and increase physical activity discussed with patient.   Tobacco abuse -Cessation counseling provided -Nicotine patch prescribed at discharge.  Consultants: None Procedures performed: See below for x-ray reports. Disposition: Home Diet recommendation: Heart healthy/low-sodium, modified carbohydrates and low calorie diet.  DISCHARGE MEDICATION: Allergies as of 05/25/2022       Reactions   Contrast Media [iodinated Contrast Media] Hives   Iohexol   Shellfish Allergy Hives        Medication List     STOP taking these medications    cyclobenzaprine 10 MG tablet Commonly known as: FLEXERIL       TAKE these medications    amLODipine 10 MG tablet Commonly known as: NORVASC Take 1 tablet (10 mg total) by mouth daily. Start taking on: May 26, 2022   amoxicillin-clavulanate 875-125 MG tablet Commonly known as: AUGMENTIN Take 1 tablet by mouth 2 (two) times daily for 7 days.   blood glucose meter kit and supplies Kit Dispense based on patient and insurance preference. Use up to four times daily as directed.   dextromethorphan-guaiFENesin 30-600 MG 12hr tablet Commonly known as: MUCINEX DM Take 1 tablet by mouth 2 (two) times daily for 10 days.   fluticasone 50 MCG/ACT nasal spray Commonly known as: FLONASE Place 2 sprays into both nostrils daily.   insulin aspart 100 UNIT/ML FlexPen Commonly known as: NOVOLOG Inject 10 Units into the skin 3 (three) times daily with meals.   insulin glargine 100  UNIT/ML Solostar Pen Commonly known as: LANTUS Inject 50 Units into the skin daily.   lisinopril 40 MG tablet Commonly known as: ZESTRIL Take 40 mg by mouth daily.   nicotine 21 mg/24hr patch Commonly known as: NICODERM CQ - dosed in mg/24 hours Place 1 patch (21 mg total) onto the skin daily.   Ozempic (1 MG/DOSE) 4 MG/3ML Sopn Generic drug: Semaglutide (1 MG/DOSE) Inject 1 mg into the skin once a week.   Pen Needles 31G X 8 MM Misc 1 Container by Does not apply route 4 (four) times daily -  with meals and at bedtime.   sertraline 100 MG tablet Commonly known as: ZOLOFT Take 200 mg by mouth daily.               Durable Medical Equipment  (From admission, onward)           Start     Ordered   05/25/22 1107  For home use only DME oxygen  Once       Question Answer Comment  Length of Need 12 Months   Mode or (Route) Nasal cannula   Liters per Minute 2   Frequency Continuous (stationary and portable oxygen unit needed)   Oxygen conserving device Yes   Oxygen delivery system Gas      05/25/22 1106            Follow-up Information     [facility] Go on 05/28/2022.  Contact information: [place] [address] [phone]                Discharge Exam: Danley Danker Weights   05/23/22 1143 05/24/22 1200  Weight: (!) 191.9 kg (!) 210 kg   General exam: Alert, awake, oriented x 3; able to speak in full sentences.  2 L nasal cannula supplementation in place demonstrating good saturation. Respiratory system: Improved air movement bilaterally, no wheezing or crackles. Cardiovascular system:RRR. No rubs or Rubs. Gastrointestinal system: Abdomen is obese, nondistended, and without any guarding; positive bowel sounds. Central nervous system: Alert and oriented. No focal neurological deficits. Extremities: No cyanosis or clubbing; 1+ edema appreciated bilaterally. Skin: No petechiae. Psychiatry: Judgement and  insight appear normal. Mood & affect appropriate.    Condition at discharge: Stable and improved.  The results of significant diagnostics from this hospitalization (including imaging, microbiology, ancillary and laboratory) are listed below for reference.   Imaging Studies: DG CHEST PORT 1 VIEW  Result Date: 05/24/2022 CLINICAL DATA:  Labored breathing, morbid obesity. EXAM: PORTABLE CHEST 1 VIEW COMPARISON:  05/23/2022. FINDINGS: The heart is enlarged and the mediastinal contour stable. The pulmonary vasculature is distended. No definite consolidation, effusion, or pneumothorax. Examination is limited due to patient's body habitus. No acute osseous abnormality. IMPRESSION: Cardiomegaly with pulmonary vascular congestion. Electronically Signed   By: Brett Fairy M.D.   On: 05/24/2022 22:22   CT Angio Chest PE W and/or Wo Contrast  Result Date: 05/23/2022 CLINICAL DATA:  Pulmonary embolism suspected, high probability. Shortness of breath. Morbid obesity. EXAM: CT ANGIOGRAPHY CHEST WITH CONTRAST TECHNIQUE: Multidetector CT imaging of the chest was performed using the standard protocol during bolus administration of intravenous contrast. Multiplanar CT image reconstructions and MIPs were obtained to evaluate the vascular anatomy. RADIATION DOSE REDUCTION: This exam was performed according to the departmental dose-optimization program which includes automated exposure control, adjustment of the mA and/or kV according to patient size and/or use of iterative reconstruction technique. CONTRAST:  34m OMNIPAQUE IOHEXOL 350 MG/ML SOLN COMPARISON:  None Available. FINDINGS: Cardiovascular: The heart is enlarged and there is no pericardial effusion. The aorta is normal in caliber. The pulmonary trunk is mildly distended, suggesting underlying pulmonary artery hypertension. No large saddle embolus. There is suboptimal opacification of the distal pulmonary arteries. Mediastinum/Nodes: No mediastinal, hilar, or  axillary lymphadenopathy. The thyroid gland, trachea, and esophagus are within normal limits. Lungs/Pleura: Patchy airspace disease is noted in the left lower lobe. No effusion or pneumothorax. Upper Abdomen: Splenectomy changes are noted in the left upper quadrant. No acute abnormality. Musculoskeletal: Degenerative changes in the thoracic spine. No acute osseous abnormality Review of the MIP images confirms the above findings. IMPRESSION: 1. No large saddle embolus. There is suboptimal opacification of the distal pulmonary arteries. 2. Patchy airspace disease in the left lower lobe, possible atelectasis or infiltrate. 3. Cardiomegaly. 4. Distended pulmonary trunk which may be associated with underlying pulmonary artery hypertension. Electronically Signed   By: LBrett FairyM.D.   On: 05/23/2022 22:31   UK[place]Venous Img Lower Right (DVT Study)  Result Date: 05/23/2022 CLINICAL DATA:  Right lower extremity swelling for 2 weeks. EXAM: RIGHT LOWER EXTREMITY VENOUS DOPPLER ULTRASOUND TECHNIQUE: Gray-scale sonography with graded compression, as well as color Doppler and duplex ultrasound were performed to evaluate the lower extremity deep venous systems from the level of the common femoral vein and including the common femoral, femoral, profunda femoral, popliteal and calf veins including the posterior tibial, peroneal and gastrocnemius veins  when visible. The superficial great saphenous vein was also interrogated. Spectral Doppler was utilized to evaluate flow at rest and with distal augmentation maneuvers in the common femoral, femoral and popliteal veins. COMPARISON:  None Available. FINDINGS: Contralateral Common Femoral Vein: Respiratory phasicity is normal and symmetric with the symptomatic side. No evidence of thrombus. Normal compressibility. Common Femoral Vein: No evidence of thrombus. Normal compressibility, respiratory phasicity and response to augmentation. Saphenofemoral Junction: No evidence of  thrombus. Normal compressibility and flow on color Doppler imaging. Profunda Femoral Vein: No evidence of thrombus. Normal compressibility and flow on color Doppler imaging. Femoral Vein: No evidence of thrombus. Normal compressibility, respiratory phasicity and response to augmentation. Popliteal Vein: No evidence of thrombus. Normal compressibility, respiratory phasicity and response to augmentation. Calf Veins: No evidence of thrombus. Normal compressibility and flow on color Doppler imaging. Superficial Great Saphenous Vein: No evidence of thrombus. Normal compressibility. Venous Reflux:  None. Other Findings:  None. IMPRESSION: Study limited by patient body habitus. Within this limitation, no evidence of deep venous thrombosis. Electronically Signed   By: Misty Stanley M.D.   On: 05/23/2022 15:49   DG Chest 2 View  Result Date: 05/23/2022 CLINICAL DATA:  Shortness of breath for 2 weeks. Hypertension and hyperglycemia. EXAM: CHEST - 2 VIEW COMPARISON:  09/18/2021 FINDINGS: Heart is enlarged but stable compared to prior study. LEFT lung base is not well evaluated due to technique/patient body habitus. RIGHT lung is clear. IMPRESSION: 1. Stable cardiomegaly. 2. LEFT lung base is not well evaluated. Electronically Signed   By: Nolon Nations M.D.   On: 05/23/2022 15:11    Microbiology: Results for orders placed or performed during the hospital encounter of 05/23/22  SARS Coronavirus 2 by RT PCR (hospital order, performed in [facility] hospital lab) *cepheid single result test* Anterior Nasal Swab     Status: None   Collection Time: 05/23/22 11:24 PM   Specimen: Anterior Nasal Swab  Result Value Ref Range Status   SARS Coronavirus 2 by RT PCR NEGATIVE NEGATIVE Final    Comment: (NOTE) SARS-CoV-2 target nucleic acids are NOT DETECTED.  The SARS-CoV-2 RNA is generally detectable in upper and lower respiratory specimens during the acute phase of infection. The lowest concentration of SARS-CoV-2  viral copies this assay can detect is 250 copies / mL. A negative result does not preclude SARS-CoV-2 infection and should not be used as the sole basis for treatment or other patient management decisions.  A negative result may occur with improper specimen collection / handling, submission of specimen other than nasopharyngeal swab, presence of viral mutation(s) within the areas targeted by this assay, and inadequate number of viral copies (<250 copies / mL). A negative result must be combined with clinical observations, patient history, and epidemiological information.  Fact Sheet for Patients:   https://www.patel.info/  Fact Sheet for Healthcare Providers: https://hall.com/  This test is not yet approved or  cleared by the [place] FDA and has been authorized for detection and/or diagnosis of SARS-CoV-2 by FDA under an Emergency Use Authorization (EUA).  This EUA will remain in effect (meaning this test can be used) for the duration of the COVID-19 declaration under Section 564(b)(1) of the Act, 21 U.S.C. section 360bbb-3(b)(1), unless the authorization is terminated or revoked sooner.  Performed at [facility], [address] [place], [place] [address]     Labs: CBC: Recent Labs  Lab 05/23/22 1214 05/24/22 0548  WBC 15.2* 14.6*  NEUTROABS  --  10.0*  HGB 14.3 13.7  HCT 42.3 40.2  MCV 79.7* 78.5*  PLT 465* 973*   Basic Metabolic Panel: Recent Labs  Lab 05/23/22 1214 05/24/22 0548 05/25/22 0651  NA 141 141 140  K 3.8 3.6 3.9  CL 101 103 99  CO2 31 29 32  GLUCOSE 134* 128* 100*  BUN _0 CREATININE 0.69 0.60* 0.61  CALCIUM 9.0 9.0 8.7*  MG  --  2.4  --    Liver Function Tests: Recent Labs  Lab 05/24/22 0548  AST 24  ALT 36  ALKPHOS 61  BILITOT 0.5  PROT 7.7  ALBUMIN 3.3*   CBG: Recent Labs  Lab 05/24/22 1210 05/24/22 1706 05/24/22 2128 05/25/22 0748 05/25/22 1223  GLUCAP 119* 154* 114*  112* 132*    Discharge time spent: greater than 30 minutes.  Signed: Barton Dubois, MD [facility] 05/25/2022

## 2022-05-25 NOTE — Progress Notes (Signed)
RT called to patient's room to check CPAP. Wife was worried it was leaking. Machine tubing was leaking appropriately in the correct port for a non vented nasal mask. Readjusted the O2 adapter and line because patient is moving around a lot and wife stated it kept popping off. Patient stable and doing well at this time.

## 2022-05-25 NOTE — Progress Notes (Signed)
SATURATION QUALIFICATIONS: (This note is used to comply with regulatory documentation for home oxygen)  Patient Saturations on Room Air at Rest = 95%  Patient Saturations on Room Air while Ambulating = 85%  Patient Saturations on 2 Liters of oxygen while Ambulating = 94%  Please briefly explain why patient needs home oxygen: patients saturation drops to 85% and patient is SOB while ambulating on RA.

## 2022-05-26 LAB — HEMOGLOBIN A1C
Hgb A1c MFr Bld: 7 % — ABNORMAL HIGH (ref 4.8–5.6)
Mean Plasma Glucose: 154 mg/dL

## 2022-05-26 LAB — LEGIONELLA PNEUMOPHILA SEROGP 1 UR AG: L. pneumophila Serogp 1 Ur Ag: NEGATIVE

## 2022-05-29 DIAGNOSIS — J9601 Acute respiratory failure with hypoxia: Secondary | ICD-10-CM | POA: Diagnosis not present

## 2022-06-02 ENCOUNTER — Ambulatory Visit (HOSPITAL_COMMUNITY): Admission: RE | Admit: 2022-06-02 | Payer: No Typology Code available for payment source | Source: Ambulatory Visit

## 2022-06-19 ENCOUNTER — Ambulatory Visit (HOSPITAL_COMMUNITY): Admission: RE | Admit: 2022-06-19 | Payer: No Typology Code available for payment source | Source: Ambulatory Visit

## 2022-06-28 DIAGNOSIS — J9601 Acute respiratory failure with hypoxia: Secondary | ICD-10-CM | POA: Diagnosis not present

## 2022-07-15 ENCOUNTER — Encounter (HOSPITAL_COMMUNITY): Payer: Self-pay

## 2022-07-15 ENCOUNTER — Ambulatory Visit (HOSPITAL_COMMUNITY): Admission: RE | Admit: 2022-07-15 | Payer: No Typology Code available for payment source | Source: Ambulatory Visit

## 2022-07-29 DIAGNOSIS — J9601 Acute respiratory failure with hypoxia: Secondary | ICD-10-CM | POA: Diagnosis not present

## 2022-08-04 ENCOUNTER — Ambulatory Visit (HOSPITAL_COMMUNITY): Payer: No Typology Code available for payment source

## 2022-08-04 ENCOUNTER — Ambulatory Visit (HOSPITAL_COMMUNITY): Admission: RE | Admit: 2022-08-04 | Payer: No Typology Code available for payment source | Source: Ambulatory Visit

## 2022-08-04 ENCOUNTER — Encounter (HOSPITAL_COMMUNITY): Payer: Self-pay

## 2022-08-28 ENCOUNTER — Encounter: Payer: Self-pay | Admitting: Radiology

## 2022-09-04 DIAGNOSIS — K436 Other and unspecified ventral hernia with obstruction, without gangrene: Secondary | ICD-10-CM | POA: Diagnosis not present

## 2022-09-04 DIAGNOSIS — K439 Ventral hernia without obstruction or gangrene: Secondary | ICD-10-CM | POA: Diagnosis not present

## 2022-09-05 ENCOUNTER — Ambulatory Visit (HOSPITAL_COMMUNITY): Payer: No Typology Code available for payment source

## 2022-09-21 ENCOUNTER — Ambulatory Visit (HOSPITAL_COMMUNITY): Admission: RE | Admit: 2022-09-21 | Payer: No Typology Code available for payment source | Source: Ambulatory Visit

## 2022-09-26 ENCOUNTER — Ambulatory Visit (HOSPITAL_COMMUNITY): Payer: No Typology Code available for payment source

## 2022-09-30 ENCOUNTER — Ambulatory Visit (HOSPITAL_COMMUNITY): Admission: RE | Admit: 2022-09-30 | Payer: No Typology Code available for payment source | Source: Ambulatory Visit

## 2022-10-03 ENCOUNTER — Other Ambulatory Visit (HOSPITAL_COMMUNITY): Payer: No Typology Code available for payment source

## 2022-10-07 ENCOUNTER — Ambulatory Visit (HOSPITAL_COMMUNITY): Admission: RE | Admit: 2022-10-07 | Payer: No Typology Code available for payment source | Source: Ambulatory Visit

## 2022-10-08 ENCOUNTER — Ambulatory Visit (HOSPITAL_COMMUNITY): Payer: No Typology Code available for payment source

## 2022-10-30 ENCOUNTER — Ambulatory Visit (HOSPITAL_COMMUNITY): Admission: RE | Admit: 2022-10-30 | Payer: No Typology Code available for payment source | Source: Ambulatory Visit

## 2022-11-21 ENCOUNTER — Ambulatory Visit (HOSPITAL_COMMUNITY)
Admission: RE | Admit: 2022-11-21 | Discharge: 2022-11-21 | Disposition: A | Discharge status: Home / Self Care | Payer: No Typology Code available for payment source | Source: Ambulatory Visit | Attending: Medical | Admitting: Medical

## 2022-11-21 DIAGNOSIS — M25512 Pain in left shoulder: Secondary | ICD-10-CM | POA: Diagnosis present

## 2023-03-05 DIAGNOSIS — K439 Ventral hernia without obstruction or gangrene: Secondary | ICD-10-CM | POA: Diagnosis not present

## 2023-07-13 ENCOUNTER — Emergency Department (HOSPITAL_COMMUNITY): Payer: No Typology Code available for payment source

## 2023-07-13 ENCOUNTER — Inpatient Hospital Stay (HOSPITAL_COMMUNITY)
Admission: EM | Admit: 2023-07-13 | Discharge: 2023-07-17 | DRG: 394 | Disposition: A | Discharge status: Home / Self Care | Payer: No Typology Code available for payment source | Attending: Family Medicine | Admitting: Family Medicine

## 2023-07-13 ENCOUNTER — Other Ambulatory Visit: Payer: Self-pay

## 2023-07-13 ENCOUNTER — Encounter (HOSPITAL_COMMUNITY): Payer: Self-pay

## 2023-07-13 DIAGNOSIS — Z7985 Long-term (current) use of injectable non-insulin antidiabetic drugs: Secondary | ICD-10-CM

## 2023-07-13 DIAGNOSIS — Z6841 Body Mass Index (BMI) 40.0 and over, adult: Secondary | ICD-10-CM

## 2023-07-13 DIAGNOSIS — Z7984 Long term (current) use of oral hypoglycemic drugs: Secondary | ICD-10-CM | POA: Diagnosis not present

## 2023-07-13 DIAGNOSIS — N200 Calculus of kidney: Secondary | ICD-10-CM | POA: Diagnosis present

## 2023-07-13 DIAGNOSIS — I10 Essential (primary) hypertension: Secondary | ICD-10-CM | POA: Diagnosis present

## 2023-07-13 DIAGNOSIS — Z823 Family history of stroke: Secondary | ICD-10-CM | POA: Diagnosis not present

## 2023-07-13 DIAGNOSIS — K436 Other and unspecified ventral hernia with obstruction, without gangrene: Principal | ICD-10-CM | POA: Diagnosis present

## 2023-07-13 DIAGNOSIS — K439 Ventral hernia without obstruction or gangrene: Secondary | ICD-10-CM | POA: Diagnosis present

## 2023-07-13 DIAGNOSIS — E876 Hypokalemia: Secondary | ICD-10-CM | POA: Diagnosis present

## 2023-07-13 DIAGNOSIS — Z743 Need for continuous supervision: Secondary | ICD-10-CM | POA: Diagnosis not present

## 2023-07-13 DIAGNOSIS — E119 Type 2 diabetes mellitus without complications: Secondary | ICD-10-CM | POA: Diagnosis present

## 2023-07-13 DIAGNOSIS — K56609 Unspecified intestinal obstruction, unspecified as to partial versus complete obstruction: Principal | ICD-10-CM | POA: Diagnosis present

## 2023-07-13 DIAGNOSIS — F419 Anxiety disorder, unspecified: Secondary | ICD-10-CM | POA: Diagnosis present

## 2023-07-13 DIAGNOSIS — G4733 Obstructive sleep apnea (adult) (pediatric): Secondary | ICD-10-CM | POA: Diagnosis present

## 2023-07-13 DIAGNOSIS — Z794 Long term (current) use of insulin: Secondary | ICD-10-CM

## 2023-07-13 DIAGNOSIS — F4310 Post-traumatic stress disorder, unspecified: Secondary | ICD-10-CM | POA: Diagnosis present

## 2023-07-13 DIAGNOSIS — F32A Depression, unspecified: Secondary | ICD-10-CM | POA: Diagnosis present

## 2023-07-13 DIAGNOSIS — Z87442 Personal history of urinary calculi: Secondary | ICD-10-CM | POA: Diagnosis not present

## 2023-07-13 DIAGNOSIS — Z1152 Encounter for screening for COVID-19: Secondary | ICD-10-CM | POA: Diagnosis not present

## 2023-07-13 DIAGNOSIS — Z91041 Radiographic dye allergy status: Secondary | ICD-10-CM | POA: Diagnosis not present

## 2023-07-13 DIAGNOSIS — Z9081 Acquired absence of spleen: Secondary | ICD-10-CM

## 2023-07-13 DIAGNOSIS — R1084 Generalized abdominal pain: Secondary | ICD-10-CM | POA: Diagnosis not present

## 2023-07-13 DIAGNOSIS — Z91013 Allergy to seafood: Secondary | ICD-10-CM

## 2023-07-13 DIAGNOSIS — K760 Fatty (change of) liver, not elsewhere classified: Secondary | ICD-10-CM | POA: Diagnosis present

## 2023-07-13 DIAGNOSIS — E785 Hyperlipidemia, unspecified: Secondary | ICD-10-CM | POA: Diagnosis present

## 2023-07-13 DIAGNOSIS — Z79899 Other long term (current) drug therapy: Secondary | ICD-10-CM | POA: Diagnosis not present

## 2023-07-13 LAB — CBC WITH DIFFERENTIAL/PLATELET
Abs Immature Granulocytes: 0.08 10*3/uL — ABNORMAL HIGH (ref 0.00–0.07)
Basophils Absolute: 0.2 10*3/uL — ABNORMAL HIGH (ref 0.0–0.1)
Basophils Relative: 1 %
Eosinophils Absolute: 0.2 10*3/uL (ref 0.0–0.5)
Eosinophils Relative: 1 %
HCT: 49.8 % (ref 39.0–52.0)
Hemoglobin: 16.6 g/dL (ref 13.0–17.0)
Immature Granulocytes: 0 %
Lymphocytes Relative: 17 %
Lymphs Abs: 3.9 10*3/uL (ref 0.7–4.0)
MCH: 27.3 pg (ref 26.0–34.0)
MCHC: 33.3 g/dL (ref 30.0–36.0)
MCV: 81.9 fL (ref 80.0–100.0)
Monocytes Absolute: 1.7 10*3/uL — ABNORMAL HIGH (ref 0.1–1.0)
Monocytes Relative: 8 %
Neutro Abs: 16.7 10*3/uL — ABNORMAL HIGH (ref 1.7–7.7)
Neutrophils Relative %: 73 %
Platelets: 457 10*3/uL — ABNORMAL HIGH (ref 150–400)
RBC: 6.08 MIL/uL — ABNORMAL HIGH (ref 4.22–5.81)
RDW: 15.9 % — ABNORMAL HIGH (ref 11.5–15.5)
WBC: 22.8 10*3/uL — ABNORMAL HIGH (ref 4.0–10.5)
nRBC: 0 % (ref 0.0–0.2)

## 2023-07-13 LAB — PROTIME-INR
INR: 1.1 (ref 0.8–1.2)
Prothrombin Time: 14.2 s (ref 11.4–15.2)

## 2023-07-13 LAB — LACTIC ACID, PLASMA: Lactic Acid, Venous: 1.6 mmol/L (ref 0.5–1.9)

## 2023-07-13 LAB — RESP PANEL BY RT-PCR (RSV, FLU A&B, COVID)  RVPGX2
Influenza A by PCR: NEGATIVE
Influenza B by PCR: NEGATIVE
Resp Syncytial Virus by PCR: NEGATIVE
SARS Coronavirus 2 by RT PCR: NEGATIVE

## 2023-07-13 LAB — COMPREHENSIVE METABOLIC PANEL
ALT: 23 U/L (ref 0–44)
AST: 17 U/L (ref 15–41)
Albumin: 3.9 g/dL (ref 3.5–5.0)
Alkaline Phosphatase: 64 U/L (ref 38–126)
Anion gap: 8 (ref 5–15)
BUN: 13 mg/dL (ref 6–20)
CO2: 31 mmol/L (ref 22–32)
Calcium: 8.9 mg/dL (ref 8.9–10.3)
Chloride: 98 mmol/L (ref 98–111)
Creatinine, Ser: 0.91 mg/dL (ref 0.61–1.24)
GFR, Estimated: >60 mL/min (ref >60)
Glucose, Bld: 122 mg/dL — ABNORMAL HIGH (ref 70–99)
Potassium: 3.6 mmol/L (ref 3.5–5.1)
Sodium: 137 mmol/L (ref 135–145)
Total Bilirubin: 0.9 mg/dL (ref 0.0–1.2)
Total Protein: 8.2 g/dL — ABNORMAL HIGH (ref 6.5–8.1)

## 2023-07-13 LAB — APTT: aPTT: 29 s (ref 24–36)

## 2023-07-13 LAB — CBG MONITORING, ED: Glucose-Capillary: 132 mg/dL — ABNORMAL HIGH (ref 70–99)

## 2023-07-13 LAB — LIPASE, BLOOD: Lipase: 27 U/L (ref 11–51)

## 2023-07-13 MED ORDER — METRONIDAZOLE 500 MG/100ML IV SOLN
500.0000 mg | Freq: Once | INTRAVENOUS | Status: AC
  Administered 2023-07-13: 500 mg via INTRAVENOUS
  Filled 2023-07-13: qty 100

## 2023-07-13 MED ORDER — MORPHINE SULFATE (PF) 2 MG/ML IV SOLN
2.0000 mg | Freq: Four times a day (QID) | INTRAVENOUS | Status: DC | PRN
  Administered 2023-07-13: 2 mg via INTRAVENOUS
  Filled 2023-07-13: qty 1

## 2023-07-13 MED ORDER — BISACODYL 5 MG PO TBEC: 5.0000 mg | DELAYED_RELEASE_TABLET | Freq: Every day | ORAL | Status: DC | PRN

## 2023-07-13 MED ORDER — LACTATED RINGERS IV BOLUS (SEPSIS)
1000.0000 mL | Freq: Once | INTRAVENOUS | Status: AC
  Administered 2023-07-13: 1000 mL via INTRAVENOUS

## 2023-07-13 MED ORDER — LACTATED RINGERS IV BOLUS: 1000.0000 mL | Freq: Once | INTRAVENOUS | Status: DC

## 2023-07-13 MED ORDER — FENTANYL CITRATE PF 50 MCG/ML IJ SOSY
50.0000 ug | PREFILLED_SYRINGE | Freq: Once | INTRAMUSCULAR | Status: AC
Start: 2023-07-13 — End: 2023-07-13
  Administered 2023-07-13: 50 ug via INTRAVENOUS
  Filled 2023-07-13: qty 1

## 2023-07-13 MED ORDER — LACTATED RINGERS IV BOLUS (SEPSIS): 1000.0000 mL | Freq: Once | INTRAVENOUS | Status: DC

## 2023-07-13 MED ORDER — OXYCODONE HCL 5 MG PO TABS
5.0000 mg | ORAL_TABLET | ORAL | Status: DC | PRN
  Administered 2023-07-13 – 2023-07-14 (×4): 5 mg via ORAL
  Filled 2023-07-13 (×5): qty 1

## 2023-07-13 MED ORDER — MORPHINE SULFATE (PF) 2 MG/ML IV SOLN
2.0000 mg | INTRAVENOUS | Status: DC | PRN
  Administered 2023-07-13 – 2023-07-16 (×10): 2 mg via INTRAVENOUS
  Filled 2023-07-13 (×11): qty 1

## 2023-07-13 MED ORDER — SERTRALINE HCL 50 MG PO TABS
200.0000 mg | ORAL_TABLET | Freq: Every day | ORAL | Status: DC
  Administered 2023-07-14 – 2023-07-17 (×4): 200 mg via ORAL
  Filled 2023-07-13 (×4): qty 4

## 2023-07-13 MED ORDER — ONDANSETRON HCL 4 MG/2ML IJ SOLN
4.0000 mg | Freq: Once | INTRAMUSCULAR | Status: AC
  Administered 2023-07-13: 4 mg via INTRAVENOUS
  Filled 2023-07-13: qty 2

## 2023-07-13 MED ORDER — POLYETHYLENE GLYCOL 3350 17 G PO PACK
17.0000 g | PACK | Freq: Every day | ORAL | Status: DC
  Administered 2023-07-14 – 2023-07-16 (×3): 17 g via ORAL
  Filled 2023-07-13 (×4): qty 1

## 2023-07-13 MED ORDER — FENTANYL CITRATE PF 50 MCG/ML IJ SOSY
50.0000 ug | PREFILLED_SYRINGE | Freq: Once | INTRAMUSCULAR | Status: AC
  Administered 2023-07-13: 50 ug via INTRAVENOUS
  Filled 2023-07-13: qty 1

## 2023-07-13 MED ORDER — ACETAMINOPHEN 650 MG RE SUPP: 650.0000 mg | Freq: Four times a day (QID) | RECTAL | Status: DC | PRN

## 2023-07-13 MED ORDER — ONDANSETRON HCL 4 MG PO TABS: 4.0000 mg | ORAL_TABLET | Freq: Four times a day (QID) | ORAL | Status: DC | PRN

## 2023-07-13 MED ORDER — LISINOPRIL 10 MG PO TABS
40.0000 mg | ORAL_TABLET | Freq: Every day | ORAL | Status: DC
Start: 2023-07-14 — End: 2023-07-14
  Administered 2023-07-14: 40 mg via ORAL
  Filled 2023-07-13: qty 4

## 2023-07-13 MED ORDER — INSULIN ASPART 100 UNIT/ML IJ SOLN: 0.0000 [IU] | Freq: Three times a day (TID) | INTRAMUSCULAR | Status: DC

## 2023-07-13 MED ORDER — SODIUM CHLORIDE 0.9 % IV SOLN
2.0000 g | Freq: Once | INTRAVENOUS | Status: AC
  Administered 2023-07-13: 2 g via INTRAVENOUS
  Filled 2023-07-13: qty 20

## 2023-07-13 MED ORDER — HEPARIN SODIUM (PORCINE) 5000 UNIT/ML IJ SOLN
5000.0000 [IU] | Freq: Three times a day (TID) | INTRAMUSCULAR | Status: DC
  Administered 2023-07-13 – 2023-07-17 (×10): 5000 [IU] via SUBCUTANEOUS
  Filled 2023-07-13 (×10): qty 1

## 2023-07-13 MED ORDER — ACETAMINOPHEN 325 MG PO TABS: 650.0000 mg | ORAL_TABLET | Freq: Four times a day (QID) | ORAL | Status: DC | PRN

## 2023-07-13 MED ORDER — ONDANSETRON HCL 4 MG/2ML IJ SOLN
4.0000 mg | Freq: Four times a day (QID) | INTRAMUSCULAR | Status: DC | PRN
  Administered 2023-07-14: 4 mg via INTRAVENOUS
  Filled 2023-07-13: qty 2

## 2023-07-13 NOTE — Sepsis Progress Note (Signed)
 Elink will follow per sepsis protocol.
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
## 2023-07-13 NOTE — ED Notes (Addendum)
 Rocephin started prior to BC's drawn due to delay in lab getting BC's

## 2023-07-13 NOTE — ED Provider Triage Note (Signed)
 Emergency Medicine Provider Triage Evaluation Note  Sean Murillo , a 48 y.o. male  was evaluated in triage.  Pt complains of with a known history of ventral hernia, also history of small bowel obstruction presenting for evaluation of sudden onset of abdominal pain along with increased abdominal distention from his baseline, diaphoresis and lightheadedness, nausea without emesis which woke him around 4 AM today.  Currently under the care of his surgeon at [facility] and is anticipating hernia surgery repair once he has sufficient weight loss, in the interim feels this is his hernia causing today's acute symptoms..  Review of Systems  Positive: Abdominal pain, nausea, diaphoresis, lightheadedness Negative: Vomiting, fever, diarrhea  Physical Exam  Ht 5' 9 (1.753 m)   Wt (!) 196.4 kg   BMI 63.94 kg/m  Gen:   Awake,diaphoretic,  uncomfortable appearing  Resp:  Normal effort  MSK:   Moves extremities without difficulty  Other:  Large right lower ventral hernia, tender.  Medical Decision Making  Medically screening exam initiated at 12:23 PM.  Appropriate orders placed.  Sean Murillo was informed that the remainder of the evaluation will be completed by another provider, this initial triage assessment does not replace that evaluation, and the importance of remaining in the ED until their evaluation is complete.     Sean Clarity, PA-C 07/13/23 1458
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
## 2023-07-13 NOTE — ED Triage Notes (Signed)
 Pt arrived via [facility] from home c/o abdominal pain that began this morning around 3am. Pt endorses nausea w/o emesis.

## 2023-07-13 NOTE — ED Provider Notes (Signed)
 [place] EMERGENCY DEPARTMENT AT [facility] Provider Note   CSN: [id number] Arrival date & time: 07/13/23  1141     History  Chief Complaint  Patient presents with   Abdominal Pain    Argyle Gustafson is a 48 y.o. male with known ventral hernia, failed surgery from 2020,  also history of kidney stones, HTN,  asthma,  obesity presenting with enlarged hernia with severe pain at the site which woke him from sleep at 4 am today.  He reports this reminds him of prior SBO.  He has nausea without emesis,  reports diaphoresis, weakness and feels lightheaded, worse with waves of pain.  No diarrhea, constipation,  last bm ytd.  He has passed flatus since arrival here.  Has found no alleviators.  He has been evaluated by general surgery at [facility] in the past for this same complaint,  advised he would need weight loss prior to consideration for elective repeat hernia repair.   The history is provided by the patient.       Home Medications Prior to Admission medications   Medication Sig Start Date End Date Taking? Authorizing Provider  amLODipine  (NORVASC ) 10 MG tablet Take 1 tablet (10 mg total) by mouth daily. 05/26/22   Ricky Fines, MD  blood glucose meter kit and supplies KIT Dispense based on patient and insurance preference. Use up to four times daily as directed. 09/20/21   Sheikh, Omair Latif, DO  fluticasone (FLONASE) 50 MCG/ACT nasal spray Place 2 sprays into both nostrils daily. 05/13/22 06/12/22  [provider]  insulin  aspart (NOVOLOG ) 100 UNIT/ML FlexPen Inject 10 Units into the skin 3 (three) times daily with meals. 09/20/21   Sherrill Cable Latif, DO  insulin  glargine (LANTUS ) 100 UNIT/ML Solostar Pen Inject 50 Units into the skin daily. Patient not taking: Reported on 05/23/2022 09/20/21   Sherrill Cable Latif, DO  Insulin  Pen Needle (PEN NEEDLES) 31G X 8 MM MISC 1 Container by Does not apply route 4 (four) times daily -  with meals and at bedtime. 09/20/21    Sherrill Cable Latif, DO  lisinopril  (ZESTRIL ) 40 MG tablet Take 40 mg by mouth daily. 12/11/20   [provider]  nicotine  (NICODERM CQ  - DOSED IN MG/24 HOURS) 21 mg/24hr patch Place 1 patch (21 mg total) onto the skin daily. 05/25/22   Ricky Fines, MD  Semaglutide , 1 MG/DOSE, (OZEMPIC , 1 MG/DOSE,) 4 MG/3ML SOPN Inject 1 mg into the skin once a week. Patient not taking: Reported on 05/23/2022 09/20/21   Sherrill Cable Latif, DO  sertraline  (ZOLOFT ) 100 MG tablet Take 200 mg by mouth daily.     [provider]      Allergies    Contrast media [iodinated contrast media] and Shellfish allergy    Review of Systems   Review of Systems  Constitutional:  Positive for diaphoresis. Negative for fever.  HENT:  Negative for congestion and sore throat.   Eyes: Negative.   Respiratory:  Negative for chest tightness and shortness of breath.   Cardiovascular:  Negative for chest pain.  Gastrointestinal:  Positive for abdominal pain and nausea. Negative for vomiting.  Genitourinary: Negative.   Musculoskeletal:  Negative for arthralgias, joint swelling and neck pain.  Skin: Negative.  Negative for rash and wound.  Neurological:  Positive for light-headedness. Negative for dizziness, weakness, numbness and headaches.  Psychiatric/Behavioral: Negative.    All other systems reviewed and are negative.   Physical Exam Updated Vital Signs BP (!) 156/94  Pulse 72   Temp 97.9 F (36.6 C) (Oral)   Resp 17   Ht 5' 9 (1.753 m)   Wt (!) 196.4 kg   SpO2 94%   BMI 63.94 kg/m  Physical Exam Vitals and nursing note reviewed.  Constitutional:      Appearance: He is well-developed.  HENT:     Head: Normocephalic and atraumatic.  Eyes:     Conjunctiva/sclera: Conjunctivae normal.  Cardiovascular:     Rate and Rhythm: Normal rate and regular rhythm.     Heart sounds: Normal heart sounds.  Pulmonary:     Effort: Pulmonary effort is normal.     Breath sounds: Normal breath sounds.  No wheezing.  Abdominal:     General: Bowel sounds are decreased.     Palpations: Abdomen is soft.     Tenderness: There is abdominal tenderness in the right lower quadrant. There is guarding.     Hernia: A hernia is present. Hernia is present in the ventral area.  Musculoskeletal:        General: Normal range of motion.     Cervical back: Normal range of motion.  Skin:    General: Skin is warm and dry.  Neurological:     Mental Status: He is alert.     ED Results / Procedures / Treatments   Labs (all labs ordered are listed, but only abnormal results are displayed) Labs Reviewed  COMPREHENSIVE METABOLIC PANEL - Abnormal; Notable for the following components:      Result Value   Glucose, Bld 122 (*)    Total Protein 8.2 (*)    All other components within normal limits  CBC WITH DIFFERENTIAL/PLATELET - Abnormal; Notable for the following components:   WBC 22.8 (*)    RBC 6.08 (*)    RDW 15.9 (*)    Platelets 457 (*)    Neutro Abs 16.7 (*)    Monocytes Absolute 1.7 (*)    Basophils Absolute 0.2 (*)    Abs Immature Granulocytes 0.08 (*)    All other components within normal limits  CBG MONITORING, ED - Abnormal; Notable for the following components:   Glucose-Capillary 132 (*)    All other components within normal limits  RESP PANEL BY RT-PCR (RSV, FLU A&B, COVID)  RVPGX2  CULTURE, BLOOD (ROUTINE X 2)  CULTURE, BLOOD (ROUTINE X 2)  LACTIC ACID, PLASMA  LIPASE, BLOOD  PROTIME-INR  APTT  URINALYSIS, ROUTINE W REFLEX MICROSCOPIC    EKG None  Radiology DG Chest Port 1 View Result Date: 07/13/2023 CLINICAL DATA:  Possible sepsis. EXAM: PORTABLE CHEST 1 VIEW COMPARISON:  Chest x-ray dated May 24, 2022. FINDINGS: Stable cardiomediastinal silhouette with normal heart size accentuated by prominent epicardial fat. Normal pulmonary vascularity. Mild left basilar atelectasis. No focal consolidation, pleural effusion, or pneumothorax. No acute osseous abnormality.  IMPRESSION: 1. Mild left basilar atelectasis. Electronically Signed   By: Elsie ONEIDA Shoulder M.D.   On: 07/13/2023 14:42   CT ABDOMEN PELVIS WO CONTRAST Result Date: 07/13/2023 CLINICAL DATA:  Abdominal pain. Recurrent hernia. History of hernia surgery in 2021. EXAM: CT ABDOMEN AND PELVIS WITHOUT CONTRAST TECHNIQUE: Multidetector CT imaging of the abdomen and pelvis was performed following the standard protocol without IV contrast. RADIATION DOSE REDUCTION: This exam was performed according to the departmental dose-optimization program which includes automated exposure control, adjustment of the mA and/or kV according to patient size and/or use of iterative reconstruction technique. COMPARISON:  CT abdomen pelvis dated January 07, 2020. FINDINGS: Lower chest:  Lingular and left lower lobe subsegmental atelectasis. Hepatobiliary: Unchanged diffusely decreased liver density. No focal liver abnormality is seen. No gallstones, gallbladder wall thickening, or biliary dilatation. Pancreas: Unremarkable. No pancreatic ductal dilatation or surrounding inflammatory changes. Spleen: Prior splenectomy. Multiple splenules in the surgical bed are again noted. Adrenals/Urinary Tract: Adrenal glands and left kidney are unremarkable. New 3 mm nonobstructive calculus in the lower pole of the right kidney. No hydronephrosis. The bladder is decompressed. Stomach/Bowel: The stomach is within normal limits. Multiple dilated loops of small bowel within the large recurrent ventral abdominal wall hernia, with transition point in the proximal ileum in the right aspect of the hernia sac (series 7, image 83 series 10, image 49). Ileum distal to the transition point is decompressed. Mild colonic diverticulosis. Normal appendix. Vascular/Lymphatic: No significant vascular findings are present. No enlarged abdominal or pelvic lymph nodes. Reproductive: Prostate is unremarkable. Other: Postsurgical changes from prior ventral hernia repair with large  recurrent midline abdominal wall hernia containing small bowel and colon. No free fluid or pneumoperitoneum. Musculoskeletal: No acute or significant osseous findings. IMPRESSION: 1. Large recurrent ventral abdominal wall hernia containing a small-bowel obstruction with transition point in the proximal ileum in the right aspect of the hernia sac. 2. New nonobstructive right nephrolithiasis. 3. Hepatic steatosis. Electronically Signed   By: Elsie ONEIDA Shoulder M.D.   On: 07/13/2023 14:41    Procedures Procedures    Medications Ordered in ED Medications  ondansetron  (ZOFRAN ) injection 4 mg (4 mg Intravenous Given 07/13/23 1324)  fentaNYL  (SUBLIMAZE ) injection 50 mcg (50 mcg Intravenous Given 07/13/23 1326)  lactated ringers  bolus 1,000 mL (1,000 mLs Intravenous New Bag/Given 07/13/23 1328)    And  lactated ringers  bolus 1,000 mL (1,000 mLs Intravenous New Bag/Given 07/13/23 1329)    And  lactated ringers  bolus 1,000 mL (1,000 mLs Intravenous New Bag/Given 07/13/23 1429)  cefTRIAXone  (ROCEPHIN ) 2 g in sodium chloride  0.9 % 100 mL IVPB (0 g Intravenous Stopped 07/13/23 1424)  metroNIDAZOLE  (FLAGYL ) IVPB 500 mg ( Intravenous Infusion Verify 07/13/23 1446)  fentaNYL  (SUBLIMAZE ) injection 50 mcg (50 mcg Intravenous Given 07/13/23 1426)  fentaNYL  (SUBLIMAZE ) injection 50 mcg (50 mcg Intravenous Given 07/13/23 1531)    ED Course/ Medical Decision Making/ A&P                                 Medical Decision Making Pt presenting with right lower abdominal pain at site of chronic large ventral hernia,  nausea without emesis.  H/o sbo,  also has had failed hernia surgeries in the past, afebrile, but significant wbc count here.  Initially,  sepsis workup started as initial bp soft,  but further blood pressures good and lactate normal.  CT imaging per below - he does have a sbo within the hernia space, but no incarceration involving the hernia itself which appears to be widely patent.  He denies vomiting,  has passed  flatus here.    Ddx also including acute appy, diverticulitis, mesenteric ischemia,   Amount and/or Complexity of Data Reviewed Labs: ordered.    Details: Labs reviewed and significant for leukocytosis to 22.8, cmet ok,  lactate normal range. Blood cx,  coags,  ua pending.  Radiology: ordered.    Details: Sbo within ventral hernia space without incarceration involving the hernia.  Right renal stone.   Discussion of management or test interpretation with external provider(s): Pt discussed with Dr. Kallie who will follow pt but states  pt would need to txr to tertiary care center (or discuss with [facility]?) if conservative measures do not resolve this sbo and pt needed surgery, too high risk surgery pt for AP.  No vomiting,  no NG tube needed currently.   Discussed with Dr. Lenon with hospitalist service who accepts pt for admission.   Risk Decision regarding hospitalization.           Final Clinical Impression(s) / ED Diagnoses Final diagnoses:  Small bowel obstruction (HCC)  Ventral hernia without obstruction or gangrene    Rx / DC Orders ED Discharge Orders     None         Birdena Mliss RIGGERS 07/13/23 1657    Melvenia Motto, MD 07/14/23 [phone]

## 2023-07-14 ENCOUNTER — Inpatient Hospital Stay (HOSPITAL_COMMUNITY): Payer: No Typology Code available for payment source

## 2023-07-14 LAB — BASIC METABOLIC PANEL
Anion gap: 7 (ref 5–15)
BUN: 15 mg/dL (ref 6–20)
CO2: 29 mmol/L (ref 22–32)
Calcium: 8.2 mg/dL — ABNORMAL LOW (ref 8.9–10.3)
Chloride: 101 mmol/L (ref 98–111)
Creatinine, Ser: 0.9 mg/dL (ref 0.61–1.24)
GFR, Estimated: >60 mL/min (ref >60)
Glucose, Bld: 92 mg/dL (ref 70–99)
Potassium: 3.3 mmol/L — ABNORMAL LOW (ref 3.5–5.1)
Sodium: 137 mmol/L (ref 135–145)

## 2023-07-14 LAB — GLUCOSE, CAPILLARY
Glucose-Capillary: 112 mg/dL — ABNORMAL HIGH (ref 70–99)
Glucose-Capillary: 83 mg/dL (ref 70–99)
Glucose-Capillary: 98 mg/dL (ref 70–99)
Glucose-Capillary: 109 mg/dL — ABNORMAL HIGH (ref 70–99)

## 2023-07-14 LAB — HEMOGLOBIN A1C
Hgb A1c MFr Bld: 5.5 % (ref 4.8–5.6)
Mean Plasma Glucose: 111.15 mg/dL

## 2023-07-14 LAB — CBC
HCT: 43.3 % (ref 39.0–52.0)
Hemoglobin: 14.8 g/dL (ref 13.0–17.0)
MCH: 27.9 pg (ref 26.0–34.0)
MCHC: 34.2 g/dL (ref 30.0–36.0)
MCV: 81.5 fL (ref 80.0–100.0)
Platelets: 413 10*3/uL — ABNORMAL HIGH (ref 150–400)
RBC: 5.31 MIL/uL (ref 4.22–5.81)
RDW: 15.6 % — ABNORMAL HIGH (ref 11.5–15.5)
WBC: 19.3 10*3/uL — ABNORMAL HIGH (ref 4.0–10.5)
nRBC: 0 % (ref 0.0–0.2)

## 2023-07-14 LAB — HIV ANTIBODY (ROUTINE TESTING W REFLEX): HIV Screen 4th Generation wRfx: NONREACTIVE

## 2023-07-14 MED ORDER — HYDRALAZINE HCL 20 MG/ML IJ SOLN: 10.0000 mg | Freq: Four times a day (QID) | INTRAMUSCULAR | Status: DC | PRN

## 2023-07-14 MED ORDER — DEXTROSE-SODIUM CHLORIDE 5-0.45 % IV SOLN: INTRAVENOUS | Status: AC

## 2023-07-14 MED ORDER — POTASSIUM CHLORIDE 10 MEQ/100ML IV SOLN
10.0000 meq | INTRAVENOUS | Status: AC
Start: 2023-07-14 — End: 2023-07-14
  Administered 2023-07-14 (×4): 10 meq via INTRAVENOUS
  Filled 2023-07-14: qty 100

## 2023-07-14 NOTE — Progress Notes (Signed)
 Mobility Specialist Progress Note:    07/14/23 1215  Mobility  Activity Ambulated with assistance in hallway  Level of Assistance Standby assist, set-up cues, supervision of patient - no hands on  Assistive Device None  Distance Ambulated (ft) 100 ft  Range of Motion/Exercises Active;All extremities  Activity Response Tolerated well  Mobility Referral Yes  Mobility visit 1 Mobility  Mobility Specialist Start Time (ACUTE ONLY) 1205  Mobility Specialist Stop Time (ACUTE ONLY) 1215  Mobility Specialist Time Calculation (min) (ACUTE ONLY) 10 min   Pt received in bed, agreeable to mobility. Required SBA to stand and ambulate with no AD. Tolerated well, asx throughout. Left pt in bed, all needs met.  Sherrilee Ditty Mobility Specialist Please contact via Special Educational Needs Teacher or  Rehab office at [phone]

## 2023-07-14 NOTE — Progress Notes (Signed)
 PROGRESS NOTE  Sean Murillo, is a 48 y.o. male, DOB - January 02, 1976, FMW:[id number]  Admit date - 07/13/2023   Admitting Physician Marien LITTIE Piety, MD  Outpatient Primary MD for the patient is [facility]  LOS - 1  Chief Complaint  Patient presents with   Abdominal Pain       Brief Narrative:   48 y.o. male with a PMH significant for SBO, ventral hernia, severe obesity, type II DM, HTN, anxiety, depression, HLD admitted on 07/13/2023 with complex SBO in the setting of rather large right-sided ventral hernia    -Assessment and Plan:  1)Complex SBO----with a large hernia sac without definite incarceration at this time -Gen surgery input appreciated -Recommends conservative management at this time -Per general surgeon okay to hold off on NG tube as long as patient is not vomiting -If patient decompensates patient will need transfer to [facility] if surgical intervention is needed -WBC trending down (22.8 >>19.3 -- Continue IV fluids until oral intake is more reliable  2)DM2- Use Novolog /Humalog Sliding scale insulin  with Accu-Cheks/Fingersticks as ordered   3)HTN--lisinopril  while oral intake remains challenging to avoid AKI -IV hydralazine  as needed elevated BP - 4) depression and anxiety--- stable, continue Zoloft   5) hypokalemia--replace and recheck potassium  Status is: Inpatient   Disposition: The patient is from: Home              Anticipated d/c is to: Home              Anticipated d/c date is: 2 days              Patient currently is not medically stable to d/c. Barriers: Not Clinically Stable-   Code Status :  -  Code Status: Full Code   Family Communication:    (patient is alert, awake and coherent)  Discussed with wife at bedside  DVT Prophylaxis  :   - SCDs heparin  injection 5,000 Units Start: 07/13/23 2200   Lab Results  Component Value Date   PLT 413 (H) 07/14/2023    Inpatient Medications  Scheduled Meds:  heparin   5,000 Units  Subcutaneous Q8H   insulin  aspart  0-6 Units Subcutaneous TID WC   lisinopril   40 mg Oral Daily   polyethylene glycol  17 g Oral Daily   sertraline   200 mg Oral Daily   Continuous Infusions:  dextrose  5 % and 0.45 % NaCl 83 mL/hr at 07/14/23 1809   PRN Meds:.acetaminophen  **OR** acetaminophen , bisacodyl , morphine  injection, ondansetron  **OR** ondansetron  (ZOFRAN ) IV, oxyCODONE    Anti-infectives (From admission, onward)    Start     Dose/Rate Route Frequency Ordered Stop   07/13/23 1315  cefTRIAXone  (ROCEPHIN ) 2 g in sodium chloride  0.9 % 100 mL IVPB        2 g 200 mL/hr over 30 Minutes Intravenous Once 07/13/23 1304 07/13/23 1424   07/13/23 1315  metroNIDAZOLE  (FLAGYL ) IVPB 500 mg        500 mg 100 mL/hr over 60 Minutes Intravenous  Once 07/13/23 1304 07/13/23 1530       Subjective: Sean Murillo today has no fevers, no emesis,  No chest pain,   - Patient's wife and brother-in-law at bedside, questions answered   Objective: Vitals:   07/14/23 0110 07/14/23 0421 07/14/23 0859 07/14/23 1401  BP: 137/82 126/81 (!) 140/88 121/71  Pulse: 82 79 80 78  Resp: 18 18  18   Temp: 98.2 F (36.8 C) 97.9 F (36.6 C)  98.6 F (37 C)  TempSrc:  Oral Oral  Oral  SpO2: 95% 95%  95%  Weight:      Height:        Intake/Output Summary (Last 24 hours) at 07/14/2023 2021 Last data filed at 07/14/2023 1809 Gross per 24 hour  Intake 413.53 ml  Output --  Net 413.53 ml   Filed Weights   07/13/23 1149 07/13/23 1959  Weight: (!) 196.4 kg (!) 181.2 kg    Physical Exam  Gen:- Awake Alert, morbidly obese, in no acute distress HEENT:- [place]yr.AT, No sclera icterus Neck-Supple Neck,No JVD,.  Lungs-  CTAB , fair symmetrical air movement CV- S1, S2 normal, regular  Abd-  +ve B.Sounds, Abd Soft, No tenderness, rather large right-sided ventral hernia Extremity/Skin:- No  edema, pedal pulses present  Psych-affect is appropriate, oriented x3 Neuro-no new focal deficits, no tremors -  Media  Information   Document Information  Photos  Large Hernia on the Right  07/14/2023 10:50  Attached To:  Hospital Encounter on 07/13/23  Source Information  Pearlean Manus, MD  [facility]  Document History      Media Information   Document Information  Photos  Large Hernia on the art  07/14/2023 10:50  Attached To:  Hospital Encounter on 07/13/23  Source Information  Pearlean Manus, MD  [facility]  Document History    Data Reviewed: I have personally reviewed following labs and imaging studies  CBC: Recent Labs  Lab 07/13/23 1206 07/14/23 0345  WBC 22.8* 19.3*  NEUTROABS 16.7*  --   HGB 16.6 14.8  HCT 49.8 43.3  MCV 81.9 81.5  PLT 457* 413*   Basic Metabolic Panel: Recent Labs  Lab 07/13/23 1206 07/14/23 0345  NA 137 137  K 3.6 3.3*  CL 98 101  CO2 31 29  GLUCOSE 122* 92  BUN 13 15  CREATININE 0.91 0.90  CALCIUM 8.9 8.2*   GFR: Estimated Creatinine Clearance: 168.4 mL/min (by C-G formula based on SCr of 0.9 mg/dL). Liver Function Tests: Recent Labs  Lab 07/13/23 1206  AST 17  ALT 23  ALKPHOS 64  BILITOT 0.9  PROT 8.2*  ALBUMIN  3.9   HbA1C: Recent Labs    07/13/23 1206  HGBA1C 5.5   Recent Results (from the past 240 hours)  Resp panel by RT-PCR (RSV, Flu A&B, Covid) Anterior Nasal Swab     Status: None   Collection Time: 07/13/23  1:31 PM   Specimen: Anterior Nasal Swab  Result Value Ref Range Status   SARS Coronavirus 2 by RT PCR NEGATIVE NEGATIVE Final    Comment: (NOTE) SARS-CoV-2 target nucleic acids are NOT DETECTED.  The SARS-CoV-2 RNA is generally detectable in upper respiratory specimens during the acute phase of infection. The lowest concentration of SARS-CoV-2 viral copies this assay can detect is 138 copies/mL. A negative result does not preclude SARS-Cov-2 infection and should not be used as the sole basis for treatment or other patient management decisions. A negative result may occur with  improper specimen  collection/handling, submission of specimen other than nasopharyngeal swab, presence of viral mutation(s) within the areas targeted by this assay, and inadequate number of viral copies(<138 copies/mL). A negative result must be combined with clinical observations, patient history, and epidemiological information. The expected result is Negative.  Fact Sheet for Patients:  bloggercourse.com  Fact Sheet for Healthcare Providers:  seriousbroker.it  This test is no t yet approved or cleared by the [place]  FDA and  has been authorized for detection and/or diagnosis of SARS-CoV-2 by  FDA under an Emergency Use Authorization (EUA). This EUA will remain  in effect (meaning this test can be used) for the duration of the COVID-19 declaration under Section 564(b)(1) of the Act, 21 U.S.C.section 360bbb-3(b)(1), unless the authorization is terminated  or revoked sooner.       Influenza A by PCR NEGATIVE NEGATIVE Final   Influenza B by PCR NEGATIVE NEGATIVE Final    Comment: (NOTE) The Xpert Xpress SARS-CoV-2/FLU/RSV plus assay is intended as an aid in the diagnosis of influenza from Nasopharyngeal swab specimens and should not be used as a sole basis for treatment. Nasal washings and aspirates are unacceptable for Xpert Xpress SARS-CoV-2/FLU/RSV testing.  Fact Sheet for Patients: bloggercourse.com  Fact Sheet for Healthcare Providers: seriousbroker.it  This test is not yet approved or cleared by the [place]  FDA and has been authorized for detection and/or diagnosis of SARS-CoV-2 by FDA under an Emergency Use Authorization (EUA). This EUA will remain in effect (meaning this test can be used) for the duration of the COVID-19 declaration under Section 564(b)(1) of the Act, 21 U.S.C. section 360bbb-3(b)(1), unless the authorization is terminated or revoked.     Resp Syncytial  Virus by PCR NEGATIVE NEGATIVE Final    Comment: (NOTE) Fact Sheet for Patients: bloggercourse.com  Fact Sheet for Healthcare Providers: seriousbroker.it  This test is not yet approved or cleared by the [place]  FDA and has been authorized for detection and/or diagnosis of SARS-CoV-2 by FDA under an Emergency Use Authorization (EUA). This EUA will remain in effect (meaning this test can be used) for the duration of the COVID-19 declaration under Section 564(b)(1) of the Act, 21 U.S.C. section 360bbb-3(b)(1), unless the authorization is terminated or revoked.  Performed at [facility], [address], [place], [place] [address]   Blood culture (routine x 2)     Status: None (Preliminary result)   Collection Time: 07/13/23  1:56 PM   Specimen: BLOOD  Result Value Ref Range Status   Specimen Description BLOOD BLOOD LEFT ARM  Final   Special Requests   Final    BOTTLES DRAWN AEROBIC AND ANAEROBIC Blood Culture adequate volume   Culture   Final    NO GROWTH < 24 HOURS Performed at [facility], [address], [place], [place] [address]    Report Status PENDING  Incomplete  Blood culture (routine x 2)     Status: None (Preliminary result)   Collection Time: 07/13/23  2:04 PM   Specimen: BLOOD  Result Value Ref Range Status   Specimen Description BLOOD BLOOD LEFT ARM  Final   Special Requests   Final    BOTTLES DRAWN AEROBIC AND ANAEROBIC Blood Culture adequate volume   Culture   Final    NO GROWTH < 24 HOURS Performed at [facility], [address] [place], [place] [address]    Report Status PENDING  Incomplete    Radiology Studies: Abd 1 View (KUB) Result Date: 07/14/2023 CLINICAL DATA:  [id number] Small bowel obstruction (HCC) [id number] EXAM: ABDOMEN - 1 VIEW COMPARISON:  07/13/2023 FINDINGS: Abnormally dilated loops of small bowel are seen peripheral to the right hemiabdomen compatible with known small bowel obstruction in  patient's large right ventral abdominal wall hernia. No gross free intraperitoneal air. IMPRESSION: Findings compatible with known small bowel obstruction in patient's large right ventral abdominal wall hernia. Electronically Signed   By: Mabel Converse D.O.   On: 07/14/2023 08:31   DG Chest Port 1 View Result Date: 07/13/2023 CLINICAL DATA:  Possible sepsis. EXAM: PORTABLE CHEST 1 VIEW COMPARISON:  Chest x-ray dated May 24, 2022. FINDINGS: Stable cardiomediastinal silhouette with normal heart size accentuated by prominent epicardial fat. Normal pulmonary vascularity. Mild left basilar atelectasis. No focal consolidation, pleural effusion, or pneumothorax. No acute osseous abnormality. IMPRESSION: 1. Mild left basilar atelectasis. Electronically Signed   By: Elsie ONEIDA Shoulder M.D.   On: 07/13/2023 14:42   CT ABDOMEN PELVIS WO CONTRAST Result Date: 07/13/2023 CLINICAL DATA:  Abdominal pain. Recurrent hernia. History of hernia surgery in 2021. EXAM: CT ABDOMEN AND PELVIS WITHOUT CONTRAST TECHNIQUE: Multidetector CT imaging of the abdomen and pelvis was performed following the standard protocol without IV contrast. RADIATION DOSE REDUCTION: This exam was performed according to the departmental dose-optimization program which includes automated exposure control, adjustment of the mA and/or kV according to patient size and/or use of iterative reconstruction technique. COMPARISON:  CT abdomen pelvis dated January 07, 2020. FINDINGS: Lower chest: Lingular and left lower lobe subsegmental atelectasis. Hepatobiliary: Unchanged diffusely decreased liver density. No focal liver abnormality is seen. No gallstones, gallbladder wall thickening, or biliary dilatation. Pancreas: Unremarkable. No pancreatic ductal dilatation or surrounding inflammatory changes. Spleen: Prior splenectomy. Multiple splenules in the surgical bed are again noted. Adrenals/Urinary Tract: Adrenal glands and left kidney are unremarkable. New 3 mm  nonobstructive calculus in the lower pole of the right kidney. No hydronephrosis. The bladder is decompressed. Stomach/Bowel: The stomach is within normal limits. Multiple dilated loops of small bowel within the large recurrent ventral abdominal wall hernia, with transition point in the proximal ileum in the right aspect of the hernia sac (series 7, image 83 series 10, image 49). Ileum distal to the transition point is decompressed. Mild colonic diverticulosis. Normal appendix. Vascular/Lymphatic: No significant vascular findings are present. No enlarged abdominal or pelvic lymph nodes. Reproductive: Prostate is unremarkable. Other: Postsurgical changes from prior ventral hernia repair with large recurrent midline abdominal wall hernia containing small bowel and colon. No free fluid or pneumoperitoneum. Musculoskeletal: No acute or significant osseous findings. IMPRESSION: 1. Large recurrent ventral abdominal wall hernia containing a small-bowel obstruction with transition point in the proximal ileum in the right aspect of the hernia sac. 2. New nonobstructive right nephrolithiasis. 3. Hepatic steatosis. Electronically Signed   By: Elsie ONEIDA Shoulder M.D.   On: 07/13/2023 14:41   Scheduled Meds:  heparin   5,000 Units Subcutaneous Q8H   insulin  aspart  0-6 Units Subcutaneous TID WC   lisinopril   40 mg Oral Daily   polyethylene glycol  17 g Oral Daily   sertraline   200 mg Oral Daily   Continuous Infusions:  dextrose  5 % and 0.45 % NaCl 83 mL/hr at 07/14/23 1809    LOS: 1 day   Rendall Carwin M.D on 07/14/2023 at 8:21 PM  Go to [email or web address] - for contact info  [facility] - Office  [phone]  If 7PM-7AM, please contact night-coverage [email or web address] 07/14/2023, 8:21 PM
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
## 2023-07-14 NOTE — Progress Notes (Signed)
 Transition of Care Department [facility]) has reviewed patient and no other TOC needs have been identified at this time. We will continue to monitor patient advancement through interdisciplinary progression rounds. If new patient transition needs arise, please place a TOC consult.  Pt's [facility] reports she [facility].  07/14/23 0755  TOC Brief Assessment  Insurance and Status Reviewed  Patient has primary care physician Yes  Home environment has been reviewed Lives with wife.  Prior level of function: Fairly independent.  Prior/Current Home Services No current home services  Social Drivers of Health Review SDOH reviewed no interventions necessary  Readmission risk has been reviewed Yes  Transition of care needs no transition of care needs at this time

## 2023-07-14 NOTE — Progress Notes (Signed)
   07/14/23 1020  Spiritual Encounters  Type of Visit Initial  Care provided to: Pt and family  Referral source Other (comment) (Spiritual Consult)  Reason for visit Routine spiritual support  OnCall Visit No   Chaplain responded to a spiritual consult for prayer. I met with the patient, Sean Murillo and his wife Sean Murillo. Edinson is confident that he is on the path of healing at this point. He asked for prayer for continued  healing avoiding any surgery. Family is well and Zyron and Sean Murillo look forward to being with them again.   Carley [facility]  [phone]

## 2023-07-15 ENCOUNTER — Inpatient Hospital Stay (HOSPITAL_COMMUNITY): Payer: No Typology Code available for payment source

## 2023-07-15 LAB — CBC
HCT: 41.3 % (ref 39.0–52.0)
Hemoglobin: 14 g/dL (ref 13.0–17.0)
MCH: 27.8 pg (ref 26.0–34.0)
MCHC: 33.9 g/dL (ref 30.0–36.0)
MCV: 82.1 fL (ref 80.0–100.0)
Platelets: 408 10*3/uL — ABNORMAL HIGH (ref 150–400)
RBC: 5.03 MIL/uL (ref 4.22–5.81)
RDW: 15.8 % — ABNORMAL HIGH (ref 11.5–15.5)
WBC: 14.2 10*3/uL — ABNORMAL HIGH (ref 4.0–10.5)
nRBC: 0 % (ref 0.0–0.2)

## 2023-07-15 LAB — RENAL FUNCTION PANEL
Albumin: 2.9 g/dL — ABNORMAL LOW (ref 3.5–5.0)
Anion gap: 8 (ref 5–15)
BUN: 12 mg/dL (ref 6–20)
CO2: 30 mmol/L (ref 22–32)
Calcium: 8.4 mg/dL — ABNORMAL LOW (ref 8.9–10.3)
Chloride: 101 mmol/L (ref 98–111)
Creatinine, Ser: 0.79 mg/dL (ref 0.61–1.24)
GFR, Estimated: >60 mL/min (ref >60)
Glucose, Bld: 79 mg/dL (ref 70–99)
Phosphorus: 3 mg/dL (ref 2.5–4.6)
Potassium: 3.8 mmol/L (ref 3.5–5.1)
Sodium: 139 mmol/L (ref 135–145)

## 2023-07-15 LAB — GLUCOSE, CAPILLARY
Glucose-Capillary: 87 mg/dL (ref 70–99)
Glucose-Capillary: 88 mg/dL (ref 70–99)
Glucose-Capillary: 97 mg/dL (ref 70–99)
Glucose-Capillary: 87 mg/dL (ref 70–99)

## 2023-07-15 NOTE — Progress Notes (Signed)
 [facility] Progress Note     Subjective: Still with pain on the Right side. No BM or  flatus.  Tolerating some clears with no nausea, doing sips now. Wife at bedside.   Objective: Vital signs in last 24 hours: Temp:  [98.1 F (36.7 C)-98.7 F (37.1 C)] 98.1 F (36.7 C) (01/15 0506) Pulse Rate:  [71-78] 71 (01/15 0506) Resp:  [18-20] 20 (01/15 0506) BP: (121-135)/(71-88) 135/88 (01/15 0506) SpO2:  [92 %-95 %] 92 % (01/15 0506) Last BM Date : 07/13/23  Intake/Output from previous day: 01/14 0701 - 01/15 0700 In: 1148 [I.V.:747.5; IV Piggyback:400.6] Out: 650 [Urine:650] Intake/Output this shift: Total I/O In: 0  Out: 550 [Urine:550]  General appearance: alert and no distress GI: soft, large hernia right abdomen, tender, non reducible   Lab Results:  Recent Labs    07/14/23 0345 07/15/23 0407  WBC 19.3* 14.2*  HGB 14.8 14.0  HCT 43.3 41.3  PLT 413* 408*   BMET Recent Labs    07/14/23 0345 07/15/23 0407  NA 137 139  K 3.3* 3.8  CL 101 101  CO2 29 30  GLUCOSE 92 79  BUN 15 12  CREATININE 0.90 0.79  CALCIUM 8.2* 8.4*   PT/INR Recent Labs    07/13/23 1404  LABPROT 14.2  INR 1.1    Studies/Results: Abd 1 View (KUB) Result Date: 07/14/2023 CLINICAL DATA:  [id number] Small bowel obstruction (HCC) [id number] EXAM: ABDOMEN - 1 VIEW COMPARISON:  07/13/2023 FINDINGS: Abnormally dilated loops of small bowel are seen peripheral to the right hemiabdomen compatible with known small bowel obstruction in patient's large right ventral abdominal wall hernia. No gross free intraperitoneal air. IMPRESSION: Findings compatible with known small bowel obstruction in patient's large right ventral abdominal wall hernia. Electronically Signed   By: Leverne Reading D.O.   On: 07/14/2023 08:31   DG Chest Port 1 View Result Date: 07/13/2023 CLINICAL DATA:  Possible sepsis. EXAM: PORTABLE CHEST 1 VIEW COMPARISON:  Chest x-ray dated May 24, 2022. FINDINGS: Stable  cardiomediastinal silhouette with normal heart size accentuated by prominent epicardial fat. Normal pulmonary vascularity. Mild left basilar atelectasis. No focal consolidation, pleural effusion, or pneumothorax. No acute osseous abnormality. IMPRESSION: 1. Mild left basilar atelectasis. Electronically Signed   By: Aleta Anda M.D.   On: 07/13/2023 14:42   CT ABDOMEN PELVIS WO CONTRAST Result Date: 07/13/2023 CLINICAL DATA:  Abdominal pain. Recurrent hernia. History of hernia surgery in 2021. EXAM: CT ABDOMEN AND PELVIS WITHOUT CONTRAST TECHNIQUE: Multidetector CT imaging of the abdomen and pelvis was performed following the standard protocol without IV contrast. RADIATION DOSE REDUCTION: This exam was performed according to the departmental dose-optimization program which includes automated exposure control, adjustment of the mA and/or kV according to patient size and/or use of iterative reconstruction technique. COMPARISON:  CT abdomen pelvis dated January 07, 2020. FINDINGS: Lower chest: Lingular and left lower lobe subsegmental atelectasis. Hepatobiliary: Unchanged diffusely decreased liver density. No focal liver abnormality is seen. No gallstones, gallbladder wall thickening, or biliary dilatation. Pancreas: Unremarkable. No pancreatic ductal dilatation or surrounding inflammatory changes. Spleen: Prior splenectomy. Multiple splenules in the surgical bed are again noted. Adrenals/Urinary Tract: Adrenal glands and left kidney are unremarkable. New 3 mm nonobstructive calculus in the lower pole of the right kidney. No hydronephrosis. The bladder is decompressed. Stomach/Bowel: The stomach is within normal limits. Multiple dilated loops of small bowel within the large recurrent ventral abdominal wall hernia, with transition point in the proximal ileum in the right aspect  of the hernia sac (series 7, image 83 series 10, image 49). Ileum distal to the transition point is decompressed. Mild colonic  diverticulosis. Normal appendix. Vascular/Lymphatic: No significant vascular findings are present. No enlarged abdominal or pelvic lymph nodes. Reproductive: Prostate is unremarkable. Other: Postsurgical changes from prior ventral hernia repair with large recurrent midline abdominal wall hernia containing small bowel and colon. No free fluid or pneumoperitoneum. Musculoskeletal: No acute or significant osseous findings. IMPRESSION: 1. Large recurrent ventral abdominal wall hernia containing a small-bowel obstruction with transition point in the proximal ileum in the right aspect of the hernia sac. 2. New nonobstructive right nephrolithiasis. 3. Hepatic steatosis. Electronically Signed   By: Aleta Anda M.D.   On: 07/13/2023 14:41    Anti-infectives: Anti-infectives (From admission, onward)    Start     Dose/Rate Route Frequency Ordered Stop   07/13/23 1315  cefTRIAXone  (ROCEPHIN ) 2 g in sodium chloride  0.9 % 100 mL IVPB        2 g 200 mL/hr over 30 Minutes Intravenous Once 07/13/23 1304 07/13/23 1424   07/13/23 1315  metroNIDAZOLE  (FLAGYL ) IVPB 500 mg        500 mg 100 mL/hr over 60 Minutes Intravenous  Once 07/13/23 1304 07/13/23 1530       Assessment/Plan: Patient with large ventral hernia and SBO inside the hernia. Improving some but not resolved. No Bms. Tolerating some sips. Discussed with him and his wife that we likely will need to do ct with oral contrast to assess if this is resolving and if not then get him transferred to [facility] where his surgeon is located.   CT oral contrast today, delaying imaging for 3 hours to allow contrast to move through Discussed with patient, family, team.    LOS: 2 days    Sean Murillo 07/15/2023

## 2023-07-15 NOTE — Hospital Course (Signed)
 48 y.o. male with a PMH significant for SBO, ventral hernia, severe obesity, type II DM, HTN, anxiety, depression, HLD.  At baseline, lived at home with wife and independent with their ADLs.   Patient presented from home to the ED on 07/13/2023 with abdominal pain since about 3am. He woke up with abdominal pain and had a "normal" bowel movement which did not improve his abdominal pain. Denies any blood in the BM. He has not eaten anything today but denies nausea, vomiting. He states that he has intermittently been passing gas. Has not tried anything to make the pain better. Has future plans for hernia repair at [facility] but is required to lose weight prior to procedure. Has already failed hernia repair x2.  Denies fever, rash, hematemesis.   They were initially treated with CTX, analgesics, IV fluids, flagyl . General surgery was consulted. Recommended initial conservative management.     Patient was admitted to medicine service for further workup and management of SBO as outlined in detail below.

## 2023-07-15 NOTE — Progress Notes (Signed)
 PROGRESS NOTE   Sean Murillo  NAT:[id number] DOB: May 06, 1976 DOA: 07/13/2023 PCP: [facility]   Chief Complaint  Patient presents with   Abdominal Pain   Level of care: Med-Surg  Brief Admission History:  48 y.o. male with a PMH significant for SBO, ventral hernia, severe obesity, type II DM, HTN, anxiety, depression, HLD.  At baseline, lived at home with wife and independent with their ADLs.   Patient presented from home to the ED on 07/13/2023 with abdominal pain since about 3am. He woke up with abdominal pain and had a "normal" bowel movement which did not improve his abdominal pain. Denies any blood in the BM. He has not eaten anything today but denies nausea, vomiting. He states that he has intermittently been passing gas. Has not tried anything to make the pain better. Has future plans for hernia repair at [facility] but is required to lose weight prior to procedure. Has already failed hernia repair x2.  Denies fever, rash, hematemesis.   They were initially treated with CTX, analgesics, IV fluids, flagyl . General surgery was consulted. Recommended initial conservative management.     Patient was admitted to medicine service for further workup and management of SBO as outlined in detail below.   Assessment and Plan:  Complex SBO - pt presented with SBO inside of his large right protruding ventral hernia - slowly improving - flatus but no BM - tolerating sips - CT with contrast today per surgeon - if no improvement, will transfer to his surgeon at [facility] - continue supportive measures  Essential hypertension  - lisinopril  and IV hydralazine   Type 2 DM  - continue SSI coverage as ordered  Depression with Anxiety  - continue sertraline    Hypokalemia - repleted, recheck in AM    DVT prophylaxis: sq heparin  Code Status: Full  Family Communication: wife at bedside  Disposition: TBD    Consultants:  Surgery  Procedures:   Antimicrobials:    Subjective: Pt  having a lot of flatus but no bowel movement.  Objective: Vitals:   07/14/23 1401 07/14/23 2054 07/15/23 0506 07/15/23 1330  BP: 121/71 129/81 135/88 120/79  Pulse: 78 73 71 74  Resp: 18 20 20 18   Temp: 98.6 F (37 C) 98.7 F (37.1 C) 98.1 F (36.7 C) 97.8 F (36.6 C)  TempSrc: Oral Oral    SpO2: 95% 94% 92% 96%  Weight:      Height:        Intake/Output Summary (Last 24 hours) at 07/15/2023 1558 Last data filed at 07/15/2023 1300 Gross per 24 hour  Intake 851.91 ml  Output 1200 ml  Net -348.09 ml   Filed Weights   07/13/23 1149 07/13/23 1959  Weight: (!) 196.4 kg (!) 181.2 kg   Examination:  General exam: Appears calm and comfortable  Respiratory system: Clear to auscultation. Respiratory effort normal. Cardiovascular system: normal S1 & S2 heard. No JVD, murmurs, rubs, gallops or clicks. No pedal edema. Gastrointestinal system: Abdomen obese with very large protruding right ventral hernia.  Central nervous system: Alert and oriented. No focal neurological deficits. Extremities: Symmetric 5 x 5 power. Skin: No rashes, lesions or ulcers. Psychiatry: Judgement and insight appear normal. Mood & affect appropriate.   Data Reviewed: I have personally reviewed following labs and imaging studies  CBC: Recent Labs  Lab 07/13/23 1206 07/14/23 0345 07/15/23 0407  WBC 22.8* 19.3* 14.2*  NEUTROABS 16.7*  --   --   HGB 16.6 14.8 14.0  HCT 49.8 43.3  41.3  MCV 81.9 81.5 82.1  PLT 457* 413* 408*    Basic Metabolic Panel: Recent Labs  Lab 07/13/23 1206 07/14/23 0345 07/15/23 0407  NA 137 137 139  K 3.6 3.3* 3.8  CL 98 101 101  CO2 31 29 30   GLUCOSE 122* 92 79  BUN 13 15 12   CREATININE 0.91 0.90 0.79  CALCIUM 8.9 8.2* 8.4*  PHOS  --   --  3.0    CBG: Recent Labs  Lab 07/14/23 1146 07/14/23 1654 07/14/23 2052 07/15/23 0809 07/15/23 1120  GLUCAP 83 98 109* 87 88    Recent Results (from the past 240 hours)  Resp panel by RT-PCR (RSV, Flu A&B, Covid)  Anterior Nasal Swab     Status: None   Collection Time: 07/13/23  1:31 PM   Specimen: Anterior Nasal Swab  Result Value Ref Range Status   SARS Coronavirus 2 by RT PCR NEGATIVE NEGATIVE Final    Comment: (NOTE) SARS-CoV-2 target nucleic acids are NOT DETECTED.  The SARS-CoV-2 RNA is generally detectable in upper respiratory specimens during the acute phase of infection. The lowest concentration of SARS-CoV-2 viral copies this assay can detect is 138 copies/mL. A negative result does not preclude SARS-Cov-2 infection and should not be used as the sole basis for treatment or other patient management decisions. A negative result may occur with  improper specimen collection/handling, submission of specimen other than nasopharyngeal swab, presence of viral mutation(s) within the areas targeted by this assay, and inadequate number of viral copies(<138 copies/mL). A negative result must be combined with clinical observations, patient history, and epidemiological information. The expected result is Negative.  Fact Sheet for Patients:  BloggerCourse.com  Fact Sheet for Healthcare Providers:  SeriousBroker.it  This test is no t yet approved or cleared by the [place]  FDA and  has been authorized for detection and/or diagnosis of SARS-CoV-2 by FDA under an Emergency Use Authorization (EUA). This EUA will remain  in effect (meaning this test can be used) for the duration of the COVID-19 declaration under Section 564(b)(1) of the Act, 21 U.S.C.section 360bbb-3(b)(1), unless the authorization is terminated  or revoked sooner.       Influenza A by PCR NEGATIVE NEGATIVE Final   Influenza B by PCR NEGATIVE NEGATIVE Final    Comment: (NOTE) The Xpert Xpress SARS-CoV-2/FLU/RSV plus assay is intended as an aid in the diagnosis of influenza from Nasopharyngeal swab specimens and should not be used as a sole basis for treatment. Nasal washings  and aspirates are unacceptable for Xpert Xpress SARS-CoV-2/FLU/RSV testing.  Fact Sheet for Patients: BloggerCourse.com  Fact Sheet for Healthcare Providers: SeriousBroker.it  This test is not yet approved or cleared by the [place]  FDA and has been authorized for detection and/or diagnosis of SARS-CoV-2 by FDA under an Emergency Use Authorization (EUA). This EUA will remain in effect (meaning this test can be used) for the duration of the COVID-19 declaration under Section 564(b)(1) of the Act, 21 U.S.C. section 360bbb-3(b)(1), unless the authorization is terminated or revoked.     Resp Syncytial Virus by PCR NEGATIVE NEGATIVE Final    Comment: (NOTE) Fact Sheet for Patients: BloggerCourse.com  Fact Sheet for Healthcare Providers: SeriousBroker.it  This test is not yet approved or cleared by the [place]  FDA and has been authorized for detection and/or diagnosis of SARS-CoV-2 by FDA under an Emergency Use Authorization (EUA). This EUA will remain in effect (meaning this test can be used) for the duration of the COVID-19 declaration  under Section 564(b)(1) of the Act, 21 U.S.C. section 360bbb-3(b)(1), unless the authorization is terminated or revoked.  Performed at [facility], [address] [place], [place] [address]   Blood culture (routine x 2)     Status: None (Preliminary result)   Collection Time: 07/13/23  1:56 PM   Specimen: BLOOD  Result Value Ref Range Status   Specimen Description BLOOD BLOOD LEFT ARM  Final   Special Requests   Final    BOTTLES DRAWN AEROBIC AND ANAEROBIC Blood Culture adequate volume   Culture   Final    NO GROWTH 2 DAYS Performed at [facility], [address], [place], [place] [address]    Report Status PENDING  Incomplete  Blood culture (routine x 2)     Status: None (Preliminary result)   Collection Time: 07/13/23  2:04 PM    Specimen: BLOOD  Result Value Ref Range Status   Specimen Description BLOOD BLOOD LEFT ARM  Final   Special Requests   Final    BOTTLES DRAWN AEROBIC AND ANAEROBIC Blood Culture adequate volume   Culture   Final    NO GROWTH 2 DAYS Performed at [facility], [address], [place], [place] [address]    Report Status PENDING  Incomplete     Radiology Studies: DG Abd 1 View Result Date: 07/15/2023 CLINICAL DATA:  Hernia of anterior right abdominal wall. EXAM: ABDOMEN - 1 VIEW COMPARISON:  July 14, 2023. FINDINGS: Phleboliths are noted in the pelvis. Large hernia involving right lateral abdominal wall is again noted which contains moderately dilated loops of small bowel concerning for obstruction. Visualized colonic loops are unremarkable. IMPRESSION: Large hernia involving right lateral abdominal wall is again noted which contains moderately dilated small bowel loops concerning for obstruction. Electronically Signed   By: Rosalene Colon M.D.   On: 07/15/2023 11:40   Abd 1 View (KUB) Result Date: 07/14/2023 CLINICAL DATA:  [id number] Small bowel obstruction (HCC) [id number] EXAM: ABDOMEN - 1 VIEW COMPARISON:  07/13/2023 FINDINGS: Abnormally dilated loops of small bowel are seen peripheral to the right hemiabdomen compatible with known small bowel obstruction in patient's large right ventral abdominal wall hernia. No gross free intraperitoneal air. IMPRESSION: Findings compatible with known small bowel obstruction in patient's large right ventral abdominal wall hernia. Electronically Signed   By: Leverne Reading D.O.   On: 07/14/2023 08:31    Scheduled Meds:  heparin   5,000 Units Subcutaneous Q8H   insulin  aspart  0-6 Units Subcutaneous TID WC   polyethylene glycol  17 g Oral Daily   sertraline   200 mg Oral Daily   Continuous Infusions:  dextrose  5 % and 0.45 % NaCl 83 mL/hr at 07/15/23 0544     LOS: 2 days   Time spent: 57 mins  Monque Haggar Lincoln Renshaw, MD How to contact the [facility] Attending or  Consulting provider 7A - 7P or covering provider during after hours 7P -7A, for this patient?  Check the care team in [facility] and look for a) attending/consulting [facility] provider listed and b) the [facility] team listed Log into [email or web address] to find provider on call.  Locate the [facility] provider you are looking for under [facility] and page to a number that you can be directly reached. If you still have difficulty reaching the provider, please page the [facility] (Director on Call) for the Hospitalists listed on amion for assistance.  07/15/2023, 3:58 PM

## 2023-07-15 NOTE — Progress Notes (Signed)
 Pt transported via [facility] to radiology department. Pt A&O, denies current complaint.

## 2023-07-16 DIAGNOSIS — K56609 Unspecified intestinal obstruction, unspecified as to partial versus complete obstruction: Secondary | ICD-10-CM

## 2023-07-16 LAB — CBC WITH DIFFERENTIAL/PLATELET
Abs Immature Granulocytes: 0.04 10*3/uL (ref 0.00–0.07)
Basophils Absolute: 0.1 10*3/uL (ref 0.0–0.1)
Basophils Relative: 1 %
Eosinophils Absolute: 0.3 10*3/uL (ref 0.0–0.5)
Eosinophils Relative: 3 %
HCT: 40.2 % (ref 39.0–52.0)
Hemoglobin: 13.6 g/dL (ref 13.0–17.0)
Immature Granulocytes: 0 %
Lymphocytes Relative: 36 %
Lymphs Abs: 4 10*3/uL (ref 0.7–4.0)
MCH: 27.6 pg (ref 26.0–34.0)
MCHC: 33.8 g/dL (ref 30.0–36.0)
MCV: 81.5 fL (ref 80.0–100.0)
Monocytes Absolute: 1.3 10*3/uL — ABNORMAL HIGH (ref 0.1–1.0)
Monocytes Relative: 11 %
Neutro Abs: 5.5 10*3/uL (ref 1.7–7.7)
Neutrophils Relative %: 49 %
Platelets: 393 10*3/uL (ref 150–400)
RBC: 4.93 MIL/uL (ref 4.22–5.81)
RDW: 15 % (ref 11.5–15.5)
WBC: 11.3 10*3/uL — ABNORMAL HIGH (ref 4.0–10.5)
nRBC: 0 % (ref 0.0–0.2)

## 2023-07-16 LAB — HEPATIC FUNCTION PANEL
ALT: 15 U/L (ref 0–44)
AST: 14 U/L — ABNORMAL LOW (ref 15–41)
Albumin: 2.9 g/dL — ABNORMAL LOW (ref 3.5–5.0)
Alkaline Phosphatase: 45 U/L (ref 38–126)
Bilirubin, Direct: 0.2 mg/dL (ref 0.0–0.2)
Indirect Bilirubin: 0.8 mg/dL (ref 0.3–0.9)
Total Bilirubin: 1 mg/dL (ref 0.0–1.2)
Total Protein: 6.8 g/dL (ref 6.5–8.1)

## 2023-07-16 LAB — BASIC METABOLIC PANEL
Anion gap: 8 (ref 5–15)
BUN: 7 mg/dL (ref 6–20)
CO2: 29 mmol/L (ref 22–32)
Calcium: 8.5 mg/dL — ABNORMAL LOW (ref 8.9–10.3)
Chloride: 100 mmol/L (ref 98–111)
Creatinine, Ser: 0.79 mg/dL (ref 0.61–1.24)
GFR, Estimated: >60 mL/min (ref >60)
Glucose, Bld: 94 mg/dL (ref 70–99)
Potassium: 3.1 mmol/L — ABNORMAL LOW (ref 3.5–5.1)
Sodium: 137 mmol/L (ref 135–145)

## 2023-07-16 LAB — MAGNESIUM: Magnesium: 2.1 mg/dL (ref 1.7–2.4)

## 2023-07-16 LAB — GLUCOSE, CAPILLARY
Glucose-Capillary: 82 mg/dL (ref 70–99)
Glucose-Capillary: 92 mg/dL (ref 70–99)
Glucose-Capillary: 85 mg/dL (ref 70–99)

## 2023-07-16 MED ORDER — POTASSIUM CHLORIDE CRYS ER 20 MEQ PO TBCR
40.0000 meq | EXTENDED_RELEASE_TABLET | Freq: Once | ORAL | Status: AC
Start: 2023-07-16 — End: 2023-07-16
  Administered 2023-07-16: 40 meq via ORAL
  Filled 2023-07-16: qty 2
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
## 2023-07-16 NOTE — Progress Notes (Signed)
Pt has medium sized BM, states feels much better now. Pt has tolerated diet without any n/v or abd pain. Pt has been ambulating in room to BR without diff or assistance.

## 2023-07-16 NOTE — Progress Notes (Addendum)
PROGRESS NOTE   Sean Murillo  JSE:[id number] DOB: 1976-04-03 DOA: 07/13/2023 PCP: [facility]   Chief Complaint  Patient presents with   Abdominal Pain   Level of care: Med-Surg  Brief Admission History:  48 y.o. male with a PMH significant for SBO, ventral hernia, severe obesity, type II DM, HTN, anxiety, depression, HLD.  At baseline, lived at home with wife and independent with their ADLs.   Patient presented from home to the ED on 07/13/2023 with abdominal pain since about 3am. He woke up with abdominal pain and had a "normal" bowel movement which did not improve his abdominal pain. Denies any blood in the BM. He has not eaten anything today but denies nausea, vomiting. He states that he has intermittently been passing gas. Has not tried anything to make the pain better. Has future plans for hernia repair at [facility] but is required to lose weight prior to procedure. Has already failed hernia repair x2.  Denies fever, rash, hematemesis.   They were initially treated with CTX, analgesics, IV fluids, flagyl. General surgery was consulted. Recommended initial conservative management.     Patient was admitted to medicine service for further workup and management of SBO as outlined in detail below.   Assessment and Plan:  Complex SBO - pt presented with SBO inside of his large right protruding ventral hernia - improving - flatus but no BM - tolerating sips - CT with contrast with no signs of obstruction - diet advanced today and enema given by surgery team  - pt to follow up with surgeon Dr. Carlynn Purl at [facility]  - if no improvement, will transfer to his surgeon at [facility] - continue supportive measures  Essential hypertension  - lisinopril and IV hydralazine  Type 2 DM  - continue SSI coverage as ordered CBG (last 3)  Recent Labs    07/15/23 2028 07/16/23 0751 07/16/23 1111  GLUCAP 87 82 92   Depression with Anxiety  - continue sertraline   Hypokalemia -  repleted  DVT prophylaxis: sq heparin Code Status: Full  Family Communication: wife at bedside  Disposition: anticipate home in 1-2 days if continues to improve     Consultants:  Surgery  Procedures:   Antimicrobials:    Subjective: Still having lots of flatus but no BM   Objective: Vitals:   07/15/23 2033 07/16/23 0506 07/16/23 1300 07/16/23 1440  BP: 132/83 132/86  (!) 157/107  Pulse: 72 63  65  Resp: 20 18  18   Temp: 97.7 F (36.5 C) 98.2 F (36.8 C)  99 F (37.2 C)  TempSrc: Oral   Oral  SpO2: 96% 95%  100%  Weight:   (!) 194.1 kg   Height:        Intake/Output Summary (Last 24 hours) at 07/16/2023 1619 Last data filed at 07/16/2023 0856 Gross per 24 hour  Intake 1096.59 ml  Output 800 ml  Net 296.59 ml   Filed Weights   07/13/23 1149 07/13/23 1959 07/16/23 1300  Weight: (!) 196.4 kg (!) 181.2 kg (!) 194.1 kg   Examination:  General exam: Appears calm and comfortable  Respiratory system: Clear to auscultation. Respiratory effort normal. Cardiovascular system: normal S1 & S2 heard. No JVD, murmurs, rubs, gallops or clicks. No pedal edema. Gastrointestinal system: Abdomen obese with very large protruding right ventral hernia.  Central nervous system: Alert and oriented. No focal neurological deficits. Extremities: Symmetric 5 x 5 power. Skin: No rashes, lesions or ulcers. Psychiatry: Judgement and insight appear  normal. Mood & affect appropriate.   Data Reviewed: I have personally reviewed following labs and imaging studies  CBC: Recent Labs  Lab 07/13/23 1206 07/14/23 0345 07/15/23 0407 07/16/23 0459  WBC 22.8* 19.3* 14.2* 11.3*  NEUTROABS 16.7*  --   --  5.5  HGB 16.6 14.8 14.0 13.6  HCT 49.8 43.3 41.3 40.2  MCV 81.9 81.5 82.1 81.5  PLT 457* 413* 408* 393    Basic Metabolic Panel: Recent Labs  Lab 07/13/23 1206 07/14/23 0345 07/15/23 0407 07/16/23 0459  NA 137 137 139 137  K 3.6 3.3* 3.8 3.1*  CL 98 101 101 100  CO2 31 29 30 29    GLUCOSE 122* 92 79 94  BUN 13 15 12 7   CREATININE 0.91 0.90 0.79 0.79  CALCIUM 8.9 8.2* 8.4* 8.5*  MG  --   --   --  2.1  PHOS  --   --  3.0  --     CBG: Recent Labs  Lab 07/15/23 1120 07/15/23 1621 07/15/23 2028 07/16/23 0751 07/16/23 1111  GLUCAP 88 97 87 82 92    Recent Results (from the past 240 hours)  Resp panel by RT-PCR (RSV, Flu A&B, Covid) Anterior Nasal Swab     Status: None   Collection Time: 07/13/23  1:31 PM   Specimen: Anterior Nasal Swab  Result Value Ref Range Status   SARS Coronavirus 2 by RT PCR NEGATIVE NEGATIVE Final    Comment: (NOTE) SARS-CoV-2 target nucleic acids are NOT DETECTED.  The SARS-CoV-2 RNA is generally detectable in upper respiratory specimens during the acute phase of infection. The lowest concentration of SARS-CoV-2 viral copies this assay can detect is 138 copies/mL. A negative result does not preclude SARS-Cov-2 infection and should not be used as the sole basis for treatment or other patient management decisions. A negative result may occur with  improper specimen collection/handling, submission of specimen other than nasopharyngeal swab, presence of viral mutation(s) within the areas targeted by this assay, and inadequate number of viral copies(<138 copies/mL). A negative result must be combined with clinical observations, patient history, and epidemiological information. The expected result is Negative.  Fact Sheet for Patients:  BloggerCourse.com  Fact Sheet for Healthcare Providers:  SeriousBroker.it  This test is no t yet approved or cleared by the [place] FDA and  has been authorized for detection and/or diagnosis of SARS-CoV-2 by FDA under an Emergency Use Authorization (EUA). This EUA will remain  in effect (meaning this test can be used) for the duration of the COVID-19 declaration under Section 564(b)(1) of the Act, 21 U.S.C.section 360bbb-3(b)(1), unless the  authorization is terminated  or revoked sooner.       Influenza A by PCR NEGATIVE NEGATIVE Final   Influenza B by PCR NEGATIVE NEGATIVE Final    Comment: (NOTE) The Xpert Xpress SARS-CoV-2/FLU/RSV plus assay is intended as an aid in the diagnosis of influenza from Nasopharyngeal swab specimens and should not be used as a sole basis for treatment. Nasal washings and aspirates are unacceptable for Xpert Xpress SARS-CoV-2/FLU/RSV testing.  Fact Sheet for Patients: BloggerCourse.com  Fact Sheet for Healthcare Providers: SeriousBroker.it  This test is not yet approved or cleared by the [place] FDA and has been authorized for detection and/or diagnosis of SARS-CoV-2 by FDA under an Emergency Use Authorization (EUA). This EUA will remain in effect (meaning this test can be used) for the duration of the COVID-19 declaration under Section 564(b)(1) of the Act, 21 U.S.C. section 360bbb-3(b)(1), unless  the authorization is terminated or revoked.     Resp Syncytial Virus by PCR NEGATIVE NEGATIVE Final    Comment: (NOTE) Fact Sheet for Patients: BloggerCourse.com  Fact Sheet for Healthcare Providers: SeriousBroker.it  This test is not yet approved or cleared by the [place] FDA and has been authorized for detection and/or diagnosis of SARS-CoV-2 by FDA under an Emergency Use Authorization (EUA). This EUA will remain in effect (meaning this test can be used) for the duration of the COVID-19 declaration under Section 564(b)(1) of the Act, 21 U.S.C. section 360bbb-3(b)(1), unless the authorization is terminated or revoked.  Performed at [facility], [address], [place], [place] [address]   Blood culture (routine x 2)     Status: None (Preliminary result)   Collection Time: 07/13/23  1:56 PM   Specimen: BLOOD  Result Value Ref Range Status   Specimen Description BLOOD  BLOOD LEFT ARM  Final   Special Requests   Final    BOTTLES DRAWN AEROBIC AND ANAEROBIC Blood Culture adequate volume   Culture   Final    NO GROWTH 3 DAYS Performed at [facility], [address] [place], [place] [address]    Report Status PENDING  Incomplete  Blood culture (routine x 2)     Status: None (Preliminary result)   Collection Time: 07/13/23  2:04 PM   Specimen: BLOOD  Result Value Ref Range Status   Specimen Description BLOOD BLOOD LEFT ARM  Final   Special Requests   Final    BOTTLES DRAWN AEROBIC AND ANAEROBIC Blood Culture adequate volume   Culture   Final    NO GROWTH 3 DAYS Performed at [facility], [address], [place], [place] [address]    Report Status PENDING  Incomplete     Radiology Studies: CT ABDOMEN PELVIS WO CONTRAST Result Date: 07/15/2023 CLINICAL DATA:  Right lower quadrant abdominal pain. Small-bowel obstruction. EXAM: CT ABDOMEN AND PELVIS WITHOUT CONTRAST TECHNIQUE: Multidetector CT imaging of the abdomen and pelvis was performed following the standard protocol without IV contrast. RADIATION DOSE REDUCTION: This exam was performed according to the departmental dose-optimization program which includes automated exposure control, adjustment of the mA and/or kV according to patient size and/or use of iterative reconstruction technique. COMPARISON:  CT of the abdomen pelvis dated 07/13/2023. FINDINGS: Evaluation of this exam is limited in the absence of intravenous contrast as well as due to body habitus. Lower chest: Partially visualized left lung base atelectasis or infiltrate. No intra-abdominal free air. Small free fluid noted in the hernia sac in the anterior lower abdomen/pannus. Hepatobiliary: Fatty liver. No biliary dilatation. No calcified gallstone or pericholecystic fluid Pancreas: Unremarkable. No pancreatic ductal dilatation or surrounding inflammatory changes. Spleen: Splenectomy with residual splenic tissue in the left upper abdomen.  Adrenals/Urinary Tract: The adrenal glands are unremarkable. There is a 3 mm nonobstructing right renal interpolar calculus. No hydronephrosis. The left kidney is unremarkable. The visualized ureters and urinary bladder appear unremarkable. Stomach/Bowel: Evidence of prior ventral hernia repair. There is a large broad-based ventral hernia to the right of the midline containing loops of small bowel, and the proximal half of the colon as well as the appendix. The neck of the hernia defect measures approximately 17 cm in axial diameter. Oral contrast opacifies loops of distal small bowel and traverses into the colon without evidence of obstruction. The appendix is normal. Vascular/Lymphatic: Mild atherosclerotic calcification of the abdominal aorta. The IVC is unremarkable. No portal venous gas. There is no adenopathy. The  heart Reproductive: The left seminal vesicles are grossly unremarkable. No pelvic mass. Other: There is diffuse edema and thickening of the skin over the right anterior pelvic wall/pannus over the hernia suspicious for cellulitis. Clinical correlation is recommended. No abscess. No soft tissue gas. Musculoskeletal: Degenerative changes of the lower lumbar spine. L5-S1 disc spacer. No acute osseous pathology. IMPRESSION: 1. Large broad-based right ventral hernia as seen on the prior CT. No evidence of obstruction. 2. Findings concerning for cellulitis of the right anterior pelvic wall/pannus over the hernia. No abscess. 3. Fatty liver. 4. A 3 mm nonobstructing right renal interpolar calculus. No hydronephrosis. 5.  Aortic Atherosclerosis (ICD10-I70.0). Electronically Signed   By: Elgie Collard M.D.   On: 07/15/2023 18:37   DG Abd 1 View Result Date: 07/15/2023 CLINICAL DATA:  Hernia of anterior right abdominal wall. EXAM: ABDOMEN - 1 VIEW COMPARISON:  July 14, 2023. FINDINGS: Phleboliths are noted in the pelvis. Large hernia involving right lateral abdominal wall is again noted which contains  moderately dilated loops of small bowel concerning for obstruction. Visualized colonic loops are unremarkable. IMPRESSION: Large hernia involving right lateral abdominal wall is again noted which contains moderately dilated small bowel loops concerning for obstruction. Electronically Signed   By: Lupita Raider M.D.   On: 07/15/2023 11:40   Scheduled Meds:  heparin  5,000 Units Subcutaneous Q8H   insulin aspart  0-6 Units Subcutaneous TID WC   polyethylene glycol  17 g Oral Daily   sertraline  200 mg Oral Daily   Continuous Infusions:   LOS: 3 days   Time spent: 51 mins  Cortny Bambach Laural Benes, MD How to contact the [facility] Attending or Consulting provider 7A - 7P or covering provider during after hours 7P -7A, for this patient?  Check the care team in [facility] and look for a) attending/consulting [facility] provider listed and b) the [facility] team listed Log into [email or web address] to find provider on call.  Locate the [facility] provider you are looking for under [facility] and page to a number that you can be directly reached. If you still have difficulty reaching the provider, please page the [facility] (Director on Call) for the Hospitalists listed on amion for assistance.  07/16/2023, 4:19 PM

## 2023-07-16 NOTE — Plan of Care (Signed)
  Problem: Education: Goal: Knowledge of General Education information will improve Description Including pain rating scale, medication(s)/side effects and non-pharmacologic comfort measures Outcome: Progressing   Problem: Health Behavior/Discharge Planning: Goal: Ability to manage health-related needs will improve Outcome: Progressing   

## 2023-07-16 NOTE — Progress Notes (Signed)
[facility]  Asked radiology to send CTs done this week to [facility]  Algis Greenhouse, MD [facility] [address] [place], [place] [address] [phone] (office)

## 2023-07-17 DIAGNOSIS — K439 Ventral hernia without obstruction or gangrene: Secondary | ICD-10-CM | POA: Diagnosis not present

## 2023-07-17 DIAGNOSIS — E876 Hypokalemia: Secondary | ICD-10-CM

## 2023-07-17 DIAGNOSIS — K56609 Unspecified intestinal obstruction, unspecified as to partial versus complete obstruction: Secondary | ICD-10-CM | POA: Diagnosis not present

## 2023-07-17 LAB — CBC
HCT: 40.2 % (ref 39.0–52.0)
Hemoglobin: 13.9 g/dL (ref 13.0–17.0)
MCH: 28 pg (ref 26.0–34.0)
MCHC: 34.6 g/dL (ref 30.0–36.0)
MCV: 81 fL (ref 80.0–100.0)
Platelets: 434 10*3/uL — ABNORMAL HIGH (ref 150–400)
RBC: 4.96 MIL/uL (ref 4.22–5.81)
RDW: 14.9 % (ref 11.5–15.5)
WBC: 12.5 10*3/uL — ABNORMAL HIGH (ref 4.0–10.5)
nRBC: 0 % (ref 0.0–0.2)

## 2023-07-17 LAB — GLUCOSE, CAPILLARY
Glucose-Capillary: 99 mg/dL (ref 70–99)
Glucose-Capillary: 104 mg/dL — ABNORMAL HIGH (ref 70–99)

## 2023-07-17 LAB — BASIC METABOLIC PANEL
Anion gap: 6 (ref 5–15)
BUN: 6 mg/dL (ref 6–20)
CO2: 29 mmol/L (ref 22–32)
Calcium: 8.7 mg/dL — ABNORMAL LOW (ref 8.9–10.3)
Chloride: 104 mmol/L (ref 98–111)
Creatinine, Ser: 0.71 mg/dL (ref 0.61–1.24)
GFR, Estimated: >60 mL/min (ref >60)
Glucose, Bld: 106 mg/dL — ABNORMAL HIGH (ref 70–99)
Potassium: 3.4 mmol/L — ABNORMAL LOW (ref 3.5–5.1)
Sodium: 139 mmol/L (ref 135–145)

## 2023-07-17 MED ORDER — POTASSIUM CHLORIDE CRYS ER 20 MEQ PO TBCR
40.0000 meq | EXTENDED_RELEASE_TABLET | Freq: Once | ORAL | Status: AC
  Administered 2023-07-17: 40 meq via ORAL
  Filled 2023-07-17: qty 2

## 2023-07-17 MED ORDER — POLYETHYLENE GLYCOL 3350 17 G PO PACK: 17.0000 g | PACK | Freq: Every day | ORAL | 1 refills | Status: AC

## 2023-07-17 NOTE — Discharge Instructions (Signed)
IMPORTANT INFORMATION: PAY CLOSE ATTENTION   PHYSICIAN DISCHARGE INSTRUCTIONS  Follow with Primary care provider  [facility]  and other consultants as instructed by your Hospitalist Physician  SEEK MEDICAL CARE OR RETURN TO EMERGENCY ROOM IF SYMPTOMS COME BACK, WORSEN OR NEW PROBLEM DEVELOPS   Please note: You were cared for by a hospitalist during your hospital stay. Every effort will be made to forward records to your primary care provider.  You can request that your primary care provider send for your hospital records if they have not received them.  Once you are discharged, your primary care physician will handle any further medical issues. Please note that NO REFILLS for any discharge medications will be authorized once you are discharged, as it is imperative that you return to your primary care physician (or establish a relationship with a primary care physician if you do not have one) for your post hospital discharge needs so that they can reassess your need for medications and monitor your lab values.  Please get a complete blood count and chemistry panel checked by your Primary MD at your next visit, and again as instructed by your Primary MD.  Get Medicines reviewed and adjusted: Please take all your medications with you for your next visit with your Primary MD  Laboratory/radiological data: Please request your Primary MD to go over all hospital tests and procedure/radiological results at the follow up, please ask your primary care provider to get all Hospital records sent to his/her office.  In some cases, they will be blood work, cultures and biopsy results pending at the time of your discharge. Please request that your primary care provider follow up on these results.  If you are diabetic, please bring your blood sugar readings with you to your follow up appointment with primary care.    Please call and make your follow up appointments as soon as possible.    Also  Note the following: If you experience worsening of your admission symptoms, develop shortness of breath, life threatening emergency, suicidal or homicidal thoughts you must seek medical attention immediately by calling 911 or calling your MD immediately  if symptoms less severe.  You must read complete instructions/literature along with all the possible adverse reactions/side effects for all the Medicines you take and that have been prescribed to you. Take any new Medicines after you have completely understood and accpet all the possible adverse reactions/side effects.   Do not drive when taking Pain medications or sleeping medications (Benzodiazepines)  Do not take more than prescribed Pain, Sleep and Anxiety Medications. It is not advisable to combine anxiety,sleep and pain medications without talking with your primary care practitioner  Special Instructions: If you have smoked or chewed Tobacco  in the last 2 yrs please stop smoking, stop any regular Alcohol  and or any Recreational drug use.  Wear Seat belts while driving.  Do not drive if taking any narcotic, mind altering or controlled substances or recreational drugs or alcohol.       

## 2023-07-17 NOTE — Plan of Care (Signed)
Patient ID: Sean Murillo, male   DOB: 02/09/1976, 48 y.o.   MRN: [id number]  Problem: Education: Goal: Knowledge of General Education information will improve Description: Including pain rating scale, medication(s)/side effects and non-pharmacologic comfort measures Outcome: Adequate for Discharge   Problem: Health Behavior/Discharge Planning: Goal: Ability to manage health-related needs will improve Outcome: Adequate for Discharge   Problem: Clinical Measurements: Goal: Ability to maintain clinical measurements within normal limits will improve Outcome: Adequate for Discharge Goal: Will remain free from infection Outcome: Adequate for Discharge Goal: Diagnostic test results will improve Outcome: Adequate for Discharge Goal: Respiratory complications will improve Outcome: Adequate for Discharge Goal: Cardiovascular complication will be avoided Outcome: Adequate for Discharge   Problem: Activity: Goal: Risk for activity intolerance will decrease Outcome: Adequate for Discharge   Problem: Nutrition: Goal: Adequate nutrition will be maintained Outcome: Adequate for Discharge   Problem: Coping: Goal: Level of anxiety will decrease Outcome: Adequate for Discharge   Problem: Elimination: Goal: Will not experience complications related to bowel motility Outcome: Adequate for Discharge Goal: Will not experience complications related to urinary retention Outcome: Adequate for Discharge   Problem: Pain Management: Goal: General experience of comfort will improve Outcome: Adequate for Discharge   Problem: Safety: Goal: Ability to remain free from injury will improve Outcome: Adequate for Discharge   Problem: Skin Integrity: Goal: Risk for impaired skin integrity will decrease Outcome: Adequate for Discharge   Problem: Education: Goal: Ability to describe self-care measures that may prevent or decrease complications (Diabetes Survival Skills Education) will  improve Outcome: Adequate for Discharge Goal: Individualized Educational Video(s) Outcome: Adequate for Discharge   Problem: Coping: Goal: Ability to adjust to condition or change in health will improve Outcome: Adequate for Discharge   Problem: Fluid Volume: Goal: Ability to maintain a balanced intake and output will improve Outcome: Adequate for Discharge   Problem: Health Behavior/Discharge Planning: Goal: Ability to identify and utilize available resources and services will improve Outcome: Adequate for Discharge Goal: Ability to manage health-related needs will improve Outcome: Adequate for Discharge   Problem: Metabolic: Goal: Ability to maintain appropriate glucose levels will improve Outcome: Adequate for Discharge   Problem: Nutritional: Goal: Maintenance of adequate nutrition will improve Outcome: Adequate for Discharge Goal: Progress toward achieving an optimal weight will improve Outcome: Adequate for Discharge   Problem: Skin Integrity: Goal: Risk for impaired skin integrity will decrease Outcome: Adequate for Discharge   Problem: Tissue Perfusion: Goal: Adequacy of tissue perfusion will improve Outcome: Adequate for Discharge    Lidia Collum, RN

## 2023-07-17 NOTE — Plan of Care (Signed)

## 2023-07-17 NOTE — Discharge Summary (Signed)
Physician Discharge Summary  Knute Mela WCH:[id number] DOB: [birth year] DOA: 07/13/2023  PCP: [facility]  Admit date: 07/13/2023 Discharge date: 07/17/2023  Admitted From:  HOME  Disposition: HOME  Recommendations for Outpatient Follow-up:  Follow up with Dr. Carlynn Purl next week as scheduled Follow up with PCP in 1-2 weeks Please obtain BMP/CBC in 1 week  Discharge Condition: STABLE   CODE STATUS: FULL DIET: soft foods recommended, frequent small meals (grazing advised)  Brief Hospitalization Summary: Please see all hospital notes, images, labs for full details of the hospitalization. Admission Provider HPI:  48 y.o. male with a PMH significant for SBO, ventral hernia, severe obesity, type II DM, HTN, anxiety, depression, HLD.  At baseline, lived at home with wife and independent with their ADLs.   Patient presented from home to the ED on 07/13/2023 with abdominal pain since about 3am. He woke up with abdominal pain and had a "normal" bowel movement which did not improve his abdominal pain. Denies any blood in the BM. He has not eaten anything today but denies nausea, vomiting. He states that he has intermittently been passing gas. Has not tried anything to make the pain better. Has future plans for hernia repair at [facility] but is required to lose weight prior to procedure. Has already failed hernia repair x2.  Denies fever, rash, hematemesis.   They were initially treated with CTX, analgesics, IV fluids, flagyl. General surgery was consulted. Recommended initial conservative management.     Patient was admitted to medicine service for further workup and management of SBO as outlined in detail below.  Hospital Course by problem list  Complex SBO - RESOLVED  - pt presented with SBO inside of his large right protruding ventral hernia - resolved after bowel rest and supportive measures - having bowel movements now - tolerating soft foods diet  - CT with contrast with no signs of  obstruction - pt to follow up with surgeon Dr. Carlynn Purl   - per surgery team ok to DC home today, follow up with Dr. Carlynn Purl - surgery advised grazing eating for now, frequent small meals - miralax 17 gram daily advised   Essential hypertension  - resumed home medications   Type 2 DM  - resumed home medications at DC  CBG (last 3)  Recent Labs (last 2 labs)       Recent Labs    07/15/23 2028 07/16/23 0751 07/16/23 1111  GLUCAP 87 82 92      Depression with Anxiety  - continue sertraline    Hypokalemia - repleted orally    Discharge Diagnoses:  Principal Problem:   Small bowel obstruction (HCC) Active Problems:   Morbid obesity with body mass index of 60.0-69.9 in adult [facility])   Hyperlipidemia   Ventral hernia without obstruction or gangrene   Hypokalemia  Discharge Instructions:  Allergies as of 07/17/2023       Reactions   Contrast Media [iodinated Contrast Media] Hives   Iohexol   Shellfish Allergy Hives        Medication List     TAKE these medications    blood glucose meter kit and supplies Kit Dispense based on patient and insurance preference. Use up to four times daily as directed.   Dexcom G7 Sensor Misc 1 Package by Does not apply route See admin instructions. Change sensor every 10 days.   fluticasone 50 MCG/ACT nasal spray Commonly known as: FLONASE Place 2 sprays into both nostrils daily as needed for allergies.   insulin aspart  100 UNIT/ML FlexPen Commonly known as: NOVOLOG Inject 10 Units into the skin 3 (three) times daily with meals.   Jardiance 25 MG Tabs tablet Generic drug: empagliflozin Take 12.5 mg by mouth daily.   lisinopril 40 MG tablet Commonly known as: ZESTRIL Take 40 mg by mouth daily.   naloxone 4 MG/0.1ML Liqd nasal spray kit Commonly known as: NARCAN Place 1 spray into the nose as needed (opioid overdose.).   Pen Needles 31G X 8 MM Misc 1 Container by Does not apply route 4 (four) times daily -  with meals and at  bedtime.   polyethylene glycol 17 g packet Commonly known as: MIRALAX / GLYCOLAX Take 17 g by mouth daily.   sertraline 100 MG tablet Commonly known as: ZOLOFT Take 200 mg by mouth daily.   Zepbound 5 MG/0.5ML Pen Generic drug: tirzepatide Inject 5 mg into the skin once a week. Takes every Tuesday.        Follow-up Information     Elissa Hefty, MD. Schedule an appointment as soon as possible for a visit in 2 week(s).   Specialty: Surgery Why: Hospital Follow Up        [facility], [facility] Follow up in 1 week(s).   Why: Hospital Follow Up Contact information: [address] [facility] [place] [address] [phone]                Allergies  Allergen Reactions   Contrast Media [Iodinated Contrast Media] Hives    Iohexol   Shellfish Allergy Hives   Allergies as of 07/17/2023       Reactions   Contrast Media [iodinated Contrast Media] Hives   Iohexol   Shellfish Allergy Hives        Medication List     TAKE these medications    blood glucose meter kit and supplies Kit Dispense based on patient and insurance preference. Use up to four times daily as directed.   Dexcom G7 Sensor Misc 1 Package by Does not apply route See admin instructions. Change sensor every 10 days.   fluticasone 50 MCG/ACT nasal spray Commonly known as: FLONASE Place 2 sprays into both nostrils daily as needed for allergies.   insulin aspart 100 UNIT/ML FlexPen Commonly known as: NOVOLOG Inject 10 Units into the skin 3 (three) times daily with meals.   Jardiance 25 MG Tabs tablet Generic drug: empagliflozin Take 12.5 mg by mouth daily.   lisinopril 40 MG tablet Commonly known as: ZESTRIL Take 40 mg by mouth daily.   naloxone 4 MG/0.1ML Liqd nasal spray kit Commonly known as: NARCAN Place 1 spray into the nose as needed (opioid overdose.).   Pen Needles 31G X 8 MM Misc 1 Container by Does not apply route 4 (four) times daily -  with meals and at  bedtime.   polyethylene glycol 17 g packet Commonly known as: MIRALAX / GLYCOLAX Take 17 g by mouth daily.   sertraline 100 MG tablet Commonly known as: ZOLOFT Take 200 mg by mouth daily.   Zepbound 5 MG/0.5ML Pen Generic drug: tirzepatide Inject 5 mg into the skin once a week. Takes every Tuesday.        Procedures/Studies: CT ABDOMEN PELVIS WO CONTRAST Result Date: 07/15/2023 CLINICAL DATA:  Right lower quadrant abdominal pain. Small-bowel obstruction. EXAM: CT ABDOMEN AND PELVIS WITHOUT CONTRAST TECHNIQUE: Multidetector CT imaging of the abdomen and pelvis was performed following the standard protocol without IV contrast. RADIATION DOSE REDUCTION: This exam was performed according to the departmental dose-optimization  program which includes automated exposure control, adjustment of the mA and/or kV according to patient size and/or use of iterative reconstruction technique. COMPARISON:  CT of the abdomen pelvis dated 07/13/2023. FINDINGS: Evaluation of this exam is limited in the absence of intravenous contrast as well as due to body habitus. Lower chest: Partially visualized left lung base atelectasis or infiltrate. No intra-abdominal free air. Small free fluid noted in the hernia sac in the anterior lower abdomen/pannus. Hepatobiliary: Fatty liver. No biliary dilatation. No calcified gallstone or pericholecystic fluid Pancreas: Unremarkable. No pancreatic ductal dilatation or surrounding inflammatory changes. Spleen: Splenectomy with residual splenic tissue in the left upper abdomen. Adrenals/Urinary Tract: The adrenal glands are unremarkable. There is a 3 mm nonobstructing right renal interpolar calculus. No hydronephrosis. The left kidney is unremarkable. The visualized ureters and urinary bladder appear unremarkable. Stomach/Bowel: Evidence of prior ventral hernia repair. There is a large broad-based ventral hernia to the right of the midline containing loops of small bowel, and the  proximal half of the colon as well as the appendix. The neck of the hernia defect measures approximately 17 cm in axial diameter. Oral contrast opacifies loops of distal small bowel and traverses into the colon without evidence of obstruction. The appendix is normal. Vascular/Lymphatic: Mild atherosclerotic calcification of the abdominal aorta. The IVC is unremarkable. No portal venous gas. There is no adenopathy. The heart Reproductive: The left seminal vesicles are grossly unremarkable. No pelvic mass. Other: There is diffuse edema and thickening of the skin over the right anterior pelvic wall/pannus over the hernia suspicious for cellulitis. Clinical correlation is recommended. No abscess. No soft tissue gas. Musculoskeletal: Degenerative changes of the lower lumbar spine. L5-S1 disc spacer. No acute osseous pathology. IMPRESSION: 1. Large broad-based right ventral hernia as seen on the prior CT. No evidence of obstruction. 2. Findings concerning for cellulitis of the right anterior pelvic wall/pannus over the hernia. No abscess. 3. Fatty liver. 4. A 3 mm nonobstructing right renal interpolar calculus. No hydronephrosis. 5.  Aortic Atherosclerosis (ICD10-I70.0). Electronically Signed   By: Elgie Collard M.D.   On: 07/15/2023 18:37   DG Abd 1 View Result Date: 07/15/2023 CLINICAL DATA:  Hernia of anterior right abdominal wall. EXAM: ABDOMEN - 1 VIEW COMPARISON:  July 14, 2023. FINDINGS: Phleboliths are noted in the pelvis. Large hernia involving right lateral abdominal wall is again noted which contains moderately dilated loops of small bowel concerning for obstruction. Visualized colonic loops are unremarkable. IMPRESSION: Large hernia involving right lateral abdominal wall is again noted which contains moderately dilated small bowel loops concerning for obstruction. Electronically Signed   By: Lupita Raider M.D.   On: 07/15/2023 11:40   Abd 1 View (KUB) Result Date: 07/14/2023 CLINICAL DATA:   [id number] Small bowel obstruction (HCC) [id number] EXAM: ABDOMEN - 1 VIEW COMPARISON:  07/13/2023 FINDINGS: Abnormally dilated loops of small bowel are seen peripheral to the right hemiabdomen compatible with known small bowel obstruction in patient's large right ventral abdominal wall hernia. No gross free intraperitoneal air. IMPRESSION: Findings compatible with known small bowel obstruction in patient's large right ventral abdominal wall hernia. Electronically Signed   By: Duanne Guess D.O.   On: 07/14/2023 08:31   DG Chest Port 1 View Result Date: 07/13/2023 CLINICAL DATA:  Possible sepsis. EXAM: PORTABLE CHEST 1 VIEW COMPARISON:  Chest x-ray dated May 24, 2022. FINDINGS: Stable cardiomediastinal silhouette with normal heart size accentuated by prominent epicardial fat. Normal pulmonary vascularity. Mild left basilar atelectasis. No focal consolidation, pleural  effusion, or pneumothorax. No acute osseous abnormality. IMPRESSION: 1. Mild left basilar atelectasis. Electronically Signed   By: Obie Dredge M.D.   On: 07/13/2023 14:42   CT ABDOMEN PELVIS WO CONTRAST Result Date: 07/13/2023 CLINICAL DATA:  Abdominal pain. Recurrent hernia. History of hernia surgery in 2021. EXAM: CT ABDOMEN AND PELVIS WITHOUT CONTRAST TECHNIQUE: Multidetector CT imaging of the abdomen and pelvis was performed following the standard protocol without IV contrast. RADIATION DOSE REDUCTION: This exam was performed according to the departmental dose-optimization program which includes automated exposure control, adjustment of the mA and/or kV according to patient size and/or use of iterative reconstruction technique. COMPARISON:  CT abdomen pelvis dated January 07, 2020. FINDINGS: Lower chest: Lingular and left lower lobe subsegmental atelectasis. Hepatobiliary: Unchanged diffusely decreased liver density. No focal liver abnormality is seen. No gallstones, gallbladder wall thickening, or biliary dilatation. Pancreas: Unremarkable.  No pancreatic ductal dilatation or surrounding inflammatory changes. Spleen: Prior splenectomy. Multiple splenules in the surgical bed are again noted. Adrenals/Urinary Tract: Adrenal glands and left kidney are unremarkable. New 3 mm nonobstructive calculus in the lower pole of the right kidney. No hydronephrosis. The bladder is decompressed. Stomach/Bowel: The stomach is within normal limits. Multiple dilated loops of small bowel within the large recurrent ventral abdominal wall hernia, with transition point in the proximal ileum in the right aspect of the hernia sac (series 7, image 83 series 10, image 49). Ileum distal to the transition point is decompressed. Mild colonic diverticulosis. Normal appendix. Vascular/Lymphatic: No significant vascular findings are present. No enlarged abdominal or pelvic lymph nodes. Reproductive: Prostate is unremarkable. Other: Postsurgical changes from prior ventral hernia repair with large recurrent midline abdominal wall hernia containing small bowel and colon. No free fluid or pneumoperitoneum. Musculoskeletal: No acute or significant osseous findings. IMPRESSION: 1. Large recurrent ventral abdominal wall hernia containing a small-bowel obstruction with transition point in the proximal ileum in the right aspect of the hernia sac. 2. New nonobstructive right nephrolithiasis. 3. Hepatic steatosis. Electronically Signed   By: Obie Dredge M.D.   On: 07/13/2023 14:41     Subjective: Pt feels much better now that he has had a bowel movement.   Discharge Exam: Vitals:   07/17/23 0010 07/17/23 0613  BP: 125/72 136/86  Pulse: 72 (!) 59  Resp: 18 16  Temp:  98 F (36.7 C)  SpO2: 90% 97%   Vitals:   07/16/23 1440 07/16/23 2049 07/17/23 0010 07/17/23 0613  BP: (!) 157/107 (!) 156/104 125/72 136/86  Pulse: 65 74 72 (!) 59  Resp: 18 16 18 16   Temp: 99 F (37.2 C) (!) 97.3 F (36.3 C)  98 F (36.7 C)  TempSrc: Oral Oral    SpO2: 100% 95% 90% 97%  Weight:       Height:       General: Pt is alert, awake, not in acute distress Cardiovascular: normal S1/S2 +, no rubs, no gallops Respiratory: CTA bilaterally, no wheezing, no rhonchi Abdominal: Soft, NT, ND, bowel sounds + Extremities: no edema, no cyanosis   The results of significant diagnostics from this hospitalization (including imaging, microbiology, ancillary and laboratory) are listed below for reference.     Microbiology: Recent Results (from the past 240 hours)  Resp panel by RT-PCR (RSV, Flu A&B, Covid) Anterior Nasal Swab     Status: None   Collection Time: 07/13/23  1:31 PM   Specimen: Anterior Nasal Swab  Result Value Ref Range Status   SARS Coronavirus 2 by RT PCR NEGATIVE  NEGATIVE Final    Comment: (NOTE) SARS-CoV-2 target nucleic acids are NOT DETECTED.  The SARS-CoV-2 RNA is generally detectable in upper respiratory specimens during the acute phase of infection. The lowest concentration of SARS-CoV-2 viral copies this assay can detect is 138 copies/mL. A negative result does not preclude SARS-Cov-2 infection and should not be used as the sole basis for treatment or other patient management decisions. A negative result may occur with  improper specimen collection/handling, submission of specimen other than nasopharyngeal swab, presence of viral mutation(s) within the areas targeted by this assay, and inadequate number of viral copies(<138 copies/mL). A negative result must be combined with clinical observations, patient history, and epidemiological information. The expected result is Negative.  Fact Sheet for Patients:  BloggerCourse.com  Fact Sheet for Healthcare Providers:  SeriousBroker.it  This test is no t yet approved or cleared by the [place] FDA and  has been authorized for detection and/or diagnosis of SARS-CoV-2 by FDA under an Emergency Use Authorization (EUA). This EUA will remain  in effect (meaning  this test can be used) for the duration of the COVID-19 declaration under Section 564(b)(1) of the Act, 21 U.S.C.section 360bbb-3(b)(1), unless the authorization is terminated  or revoked sooner.       Influenza A by PCR NEGATIVE NEGATIVE Final   Influenza B by PCR NEGATIVE NEGATIVE Final    Comment: (NOTE) The Xpert Xpress SARS-CoV-2/FLU/RSV plus assay is intended as an aid in the diagnosis of influenza from Nasopharyngeal swab specimens and should not be used as a sole basis for treatment. Nasal washings and aspirates are unacceptable for Xpert Xpress SARS-CoV-2/FLU/RSV testing.  Fact Sheet for Patients: BloggerCourse.com  Fact Sheet for Healthcare Providers: SeriousBroker.it  This test is not yet approved or cleared by the [place] FDA and has been authorized for detection and/or diagnosis of SARS-CoV-2 by FDA under an Emergency Use Authorization (EUA). This EUA will remain in effect (meaning this test can be used) for the duration of the COVID-19 declaration under Section 564(b)(1) of the Act, 21 U.S.C. section 360bbb-3(b)(1), unless the authorization is terminated or revoked.     Resp Syncytial Virus by PCR NEGATIVE NEGATIVE Final    Comment: (NOTE) Fact Sheet for Patients: BloggerCourse.com  Fact Sheet for Healthcare Providers: SeriousBroker.it  This test is not yet approved or cleared by the [place] FDA and has been authorized for detection and/or diagnosis of SARS-CoV-2 by FDA under an Emergency Use Authorization (EUA). This EUA will remain in effect (meaning this test can be used) for the duration of the COVID-19 declaration under Section 564(b)(1) of the Act, 21 U.S.C. section 360bbb-3(b)(1), unless the authorization is terminated or revoked.  Performed at [facility], [address] [place], [place] [address]   Blood culture (routine x 2)      Status: None (Preliminary result)   Collection Time: 07/13/23  1:56 PM   Specimen: BLOOD  Result Value Ref Range Status   Specimen Description BLOOD BLOOD LEFT ARM  Final   Special Requests   Final    BOTTLES DRAWN AEROBIC AND ANAEROBIC Blood Culture adequate volume   Culture   Final    NO GROWTH 4 DAYS Performed at [facility], [address], [place], [place] [address]    Report Status PENDING  Incomplete  Blood culture (routine x 2)     Status: None (Preliminary result)   Collection Time: 07/13/23  2:04 PM   Specimen: BLOOD  Result Value Ref Range Status   Specimen  Description BLOOD BLOOD LEFT ARM  Final   Special Requests   Final    BOTTLES DRAWN AEROBIC AND ANAEROBIC Blood Culture adequate volume   Culture   Final    NO GROWTH 4 DAYS Performed at [facility], [address], [place], [place] [address]    Report Status PENDING  Incomplete     Labs: BNP (last 3 results) No results for input(s): "BNP" in the last 8760 hours. Basic Metabolic Panel: Recent Labs  Lab 07/13/23 1206 07/14/23 0345 07/15/23 0407 07/16/23 0459 07/17/23 0404  NA 137 137 139 137 139  K 3.6 3.3* 3.8 3.1* 3.4*  CL 98 101 101 100 104  CO2 31 29 30 29 29   GLUCOSE 122* 92 79 94 106*  BUN 13 15 12 7 6   CREATININE 0.91 0.90 0.79 0.79 0.71  CALCIUM 8.9 8.2* 8.4* 8.5* 8.7*  MG  --   --   --  2.1  --   PHOS  --   --  3.0  --   --    Liver Function Tests: Recent Labs  Lab 07/13/23 1206 07/15/23 0407 07/16/23 0459  AST 17  --  14*  ALT 23  --  15  ALKPHOS 64  --  45  BILITOT 0.9  --  1.0  PROT 8.2*  --  6.8  ALBUMIN 3.9 2.9* 2.9*   Recent Labs  Lab 07/13/23 1206  LIPASE 27   No results for input(s): "AMMONIA" in the last 168 hours. CBC: Recent Labs  Lab 07/13/23 1206 07/14/23 0345 07/15/23 0407 07/16/23 0459 07/17/23 0404  WBC 22.8* 19.3* 14.2* 11.3* 12.5*  NEUTROABS 16.7*  --   --  5.5  --   HGB 16.6 14.8 14.0 13.6 13.9  HCT 49.8 43.3 41.3 40.2 40.2  MCV 81.9 81.5 82.1 81.5  81.0  PLT 457* 413* 408* 393 434*   Cardiac Enzymes: No results for input(s): "CKTOTAL", "CKMB", "CKMBINDEX", "TROPONINI" in the last 168 hours. BNP: Invalid input(s): "POCBNP" CBG: Recent Labs  Lab 07/15/23 2028 07/16/23 0751 07/16/23 1111 07/16/23 1700 07/17/23 0757  GLUCAP 87 82 92 85 99   D-Dimer No results for input(s): "DDIMER" in the last 72 hours. Hgb A1c No results for input(s): "HGBA1C" in the last 72 hours. Lipid Profile No results for input(s): "CHOL", "HDL", "LDLCALC", "TRIG", "CHOLHDL", "LDLDIRECT" in the last 72 hours. Thyroid function studies No results for input(s): "TSH", "T4TOTAL", "T3FREE", "THYROIDAB" in the last 72 hours.  Invalid input(s): "FREET3" Anemia work up No results for input(s): "VITAMINB12", "FOLATE", "FERRITIN", "TIBC", "IRON", "RETICCTPCT" in the last 72 hours. Urinalysis    Component Value Date/Time   COLORURINE YELLOW 05/23/2022 1345   APPEARANCEUR CLEAR 05/23/2022 1345   LABSPEC 1.020 05/23/2022 1345   PHURINE 6.0 05/23/2022 1345   GLUCOSEU NEGATIVE 05/23/2022 1345   HGBUR SMALL (A) 05/23/2022 1345   BILIRUBINUR NEGATIVE 05/23/2022 1345   KETONESUR NEGATIVE 05/23/2022 1345   PROTEINUR >=300 (A) 05/23/2022 1345   UROBILINOGEN 2.0 (H) 08/26/2012 0310   NITRITE NEGATIVE 05/23/2022 1345   LEUKOCYTESUR NEGATIVE 05/23/2022 1345   Sepsis Labs Recent Labs  Lab 07/14/23 0345 07/15/23 0407 07/16/23 0459 07/17/23 0404  WBC 19.3* 14.2* 11.3* 12.5*   Microbiology Recent Results (from the past 240 hours)  Resp panel by RT-PCR (RSV, Flu A&B, Covid) Anterior Nasal Swab     Status: None   Collection Time: 07/13/23  1:31 PM   Specimen: Anterior Nasal Swab  Result Value Ref Range Status   SARS  Coronavirus 2 by RT PCR NEGATIVE NEGATIVE Final    Comment: (NOTE) SARS-CoV-2 target nucleic acids are NOT DETECTED.  The SARS-CoV-2 RNA is generally detectable in upper respiratory specimens during the acute phase of infection. The  lowest concentration of SARS-CoV-2 viral copies this assay can detect is 138 copies/mL. A negative result does not preclude SARS-Cov-2 infection and should not be used as the sole basis for treatment or other patient management decisions. A negative result may occur with  improper specimen collection/handling, submission of specimen other than nasopharyngeal swab, presence of viral mutation(s) within the areas targeted by this assay, and inadequate number of viral copies(<138 copies/mL). A negative result must be combined with clinical observations, patient history, and epidemiological information. The expected result is Negative.  Fact Sheet for Patients:  BloggerCourse.com  Fact Sheet for Healthcare Providers:  SeriousBroker.it  This test is no t yet approved or cleared by the [place] FDA and  has been authorized for detection and/or diagnosis of SARS-CoV-2 by FDA under an Emergency Use Authorization (EUA). This EUA will remain  in effect (meaning this test can be used) for the duration of the COVID-19 declaration under Section 564(b)(1) of the Act, 21 U.S.C.section 360bbb-3(b)(1), unless the authorization is terminated  or revoked sooner.       Influenza A by PCR NEGATIVE NEGATIVE Final   Influenza B by PCR NEGATIVE NEGATIVE Final    Comment: (NOTE) The Xpert Xpress SARS-CoV-2/FLU/RSV plus assay is intended as an aid in the diagnosis of influenza from Nasopharyngeal swab specimens and should not be used as a sole basis for treatment. Nasal washings and aspirates are unacceptable for Xpert Xpress SARS-CoV-2/FLU/RSV testing.  Fact Sheet for Patients: BloggerCourse.com  Fact Sheet for Healthcare Providers: SeriousBroker.it  This test is not yet approved or cleared by the [place] FDA and has been authorized for detection and/or diagnosis of SARS-CoV-2 by FDA under  an Emergency Use Authorization (EUA). This EUA will remain in effect (meaning this test can be used) for the duration of the COVID-19 declaration under Section 564(b)(1) of the Act, 21 U.S.C. section 360bbb-3(b)(1), unless the authorization is terminated or revoked.     Resp Syncytial Virus by PCR NEGATIVE NEGATIVE Final    Comment: (NOTE) Fact Sheet for Patients: BloggerCourse.com  Fact Sheet for Healthcare Providers: SeriousBroker.it  This test is not yet approved or cleared by the [place] FDA and has been authorized for detection and/or diagnosis of SARS-CoV-2 by FDA under an Emergency Use Authorization (EUA). This EUA will remain in effect (meaning this test can be used) for the duration of the COVID-19 declaration under Section 564(b)(1) of the Act, 21 U.S.C. section 360bbb-3(b)(1), unless the authorization is terminated or revoked.  Performed at [facility], [address], [place], [place] [address]   Blood culture (routine x 2)     Status: None (Preliminary result)   Collection Time: 07/13/23  1:56 PM   Specimen: BLOOD  Result Value Ref Range Status   Specimen Description BLOOD BLOOD LEFT ARM  Final   Special Requests   Final    BOTTLES DRAWN AEROBIC AND ANAEROBIC Blood Culture adequate volume   Culture   Final    NO GROWTH 4 DAYS Performed at [facility], [address], [place], [place] [address]    Report Status PENDING  Incomplete  Blood culture (routine x 2)     Status: None (Preliminary result)   Collection Time: 07/13/23  2:04 PM   Specimen: BLOOD  Result Value  Ref Range Status   Specimen Description BLOOD BLOOD LEFT ARM  Final   Special Requests   Final    BOTTLES DRAWN AEROBIC AND ANAEROBIC Blood Culture adequate volume   Culture   Final    NO GROWTH 4 DAYS Performed at [facility], [address], [place], [place] [address]    Report Status PENDING  Incomplete   Time coordinating discharge: 42  mins   SIGNED:  Standley Dakins, MD  [facility] 07/17/2023, 9:48 AM How to contact the [facility] Attending or Consulting provider 7A - 7P or covering provider during after hours 7P -7A, for this patient?  Check the care team in [facility] and look for a) attending/consulting [facility] provider listed and b) the [facility] team listed Log into [email or web address] and use [place]'s universal password to access. If you do not have the password, please contact the hospital operator. Locate the [facility] provider you are looking for under [facility] and page to a number that you can be directly reached. If you still have difficulty reaching the provider, please page the [facility] (Director on Call) for the Hospitalists listed on amion for assistance.
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
## 2023-07-18 LAB — CULTURE, BLOOD (ROUTINE X 2)
Culture: NO GROWTH
Culture: NO GROWTH
Special Requests: ADEQUATE
Special Requests: ADEQUATE

## 2023-08-13 DIAGNOSIS — K439 Ventral hernia without obstruction or gangrene: Secondary | ICD-10-CM | POA: Diagnosis not present

## 2023-11-02 DIAGNOSIS — E1142 Type 2 diabetes mellitus with diabetic polyneuropathy: Secondary | ICD-10-CM | POA: Diagnosis not present

## 2023-11-02 DIAGNOSIS — Z794 Long term (current) use of insulin: Secondary | ICD-10-CM | POA: Diagnosis not present

## 2023-11-02 DIAGNOSIS — Z8249 Family history of ischemic heart disease and other diseases of the circulatory system: Secondary | ICD-10-CM | POA: Diagnosis not present

## 2023-11-02 DIAGNOSIS — M1990 Unspecified osteoarthritis, unspecified site: Secondary | ICD-10-CM | POA: Diagnosis not present

## 2023-11-02 DIAGNOSIS — J4489 Other specified chronic obstructive pulmonary disease: Secondary | ICD-10-CM | POA: Diagnosis not present

## 2023-11-02 DIAGNOSIS — G4733 Obstructive sleep apnea (adult) (pediatric): Secondary | ICD-10-CM | POA: Diagnosis not present

## 2023-11-02 DIAGNOSIS — I10 Essential (primary) hypertension: Secondary | ICD-10-CM | POA: Diagnosis not present

## 2023-11-02 DIAGNOSIS — Z833 Family history of diabetes mellitus: Secondary | ICD-10-CM | POA: Diagnosis not present

## 2023-11-02 DIAGNOSIS — I499 Cardiac arrhythmia, unspecified: Secondary | ICD-10-CM | POA: Diagnosis not present

## 2023-11-02 DIAGNOSIS — Z823 Family history of stroke: Secondary | ICD-10-CM | POA: Diagnosis not present

## 2023-11-02 DIAGNOSIS — F325 Major depressive disorder, single episode, in full remission: Secondary | ICD-10-CM | POA: Diagnosis not present

## 2023-11-27 ENCOUNTER — Encounter: Payer: Self-pay | Admitting: Internal Medicine

## 2023-12-01 ENCOUNTER — Telehealth: Payer: Self-pay

## 2023-12-01 NOTE — Progress Notes (Deleted)
 Called patient to confirm height and weight to calculate BMI.   Height 5'9, Weight 420lbs- BMI 62.02   Due to criteria, patient is not a candidate for colonoscopy at [facility].   Pt to be rescheduled to have procedure done at hospital.

## 2023-12-01 NOTE — Telephone Encounter (Signed)
 This patient needs a routine office visit to discuss screening options given his high risk nature

## 2023-12-01 NOTE — Telephone Encounter (Signed)
 Called patient to confirm height and weight to calculate BMI.   Height 5'9, Weight 420lbs- BMI 62.02   Due to criteria, patient is not a candidate for colonoscopy at [facility].   Pt to be rescheduled to have procedure done at hospital.

## 2023-12-01 NOTE — Telephone Encounter (Signed)
 Explained to pt that MD wants to see him in an OV to discuss POC. Pt states he understands. Pt has OV with Dr Elvin Hammer on 7/30 @ 9:40 am. Instructed pt to come 15 min prior to OV time. No other questions at this time.

## 2023-12-01 NOTE — Telephone Encounter (Signed)
 Spoke to patient to confirm height and weight.  Weight of 420lbs and height of 5'9 BMI of 62.02.

## 2023-12-08 ENCOUNTER — Ambulatory Visit

## 2023-12-24 ENCOUNTER — Encounter: Admitting: Internal Medicine

## 2024-01-07 DIAGNOSIS — I10 Essential (primary) hypertension: Secondary | ICD-10-CM | POA: Diagnosis not present

## 2024-01-07 DIAGNOSIS — E119 Type 2 diabetes mellitus without complications: Secondary | ICD-10-CM | POA: Diagnosis not present

## 2024-01-07 DIAGNOSIS — Z6841 Body Mass Index (BMI) 40.0 and over, adult: Secondary | ICD-10-CM | POA: Diagnosis not present

## 2024-01-27 ENCOUNTER — Encounter: Payer: Self-pay | Admitting: Internal Medicine

## 2024-01-27 ENCOUNTER — Ambulatory Visit (INDEPENDENT_AMBULATORY_CARE_PROVIDER_SITE_OTHER): Admitting: Internal Medicine

## 2024-01-27 VITALS — BP 132/80 | HR 83 | Ht 69.0 in | Wt >= 6400 oz

## 2024-01-27 DIAGNOSIS — R1031 Right lower quadrant pain: Secondary | ICD-10-CM | POA: Diagnosis not present

## 2024-01-27 DIAGNOSIS — G8929 Other chronic pain: Secondary | ICD-10-CM

## 2024-01-27 DIAGNOSIS — Z1211 Encounter for screening for malignant neoplasm of colon: Secondary | ICD-10-CM

## 2024-01-27 DIAGNOSIS — Z6841 Body Mass Index (BMI) 40.0 and over, adult: Secondary | ICD-10-CM

## 2024-01-27 DIAGNOSIS — K439 Ventral hernia without obstruction or gangrene: Secondary | ICD-10-CM | POA: Diagnosis not present
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
## 2024-01-27 NOTE — Patient Instructions (Signed)
 Reach back out to us  after you get your hernia repaired to schedule a colonoscopy.  _______________________________________________________  If your blood pressure at your visit was 140/90 or greater, please contact your primary care physician to follow up on this.  _______________________________________________________  If you are age 46 or older, your body mass index should be between 23-30. Your Body mass index is 59.66 kg/m. If this is out of the aforementioned range listed, please consider follow up with your Primary Care Provider.  If you are age 102 or younger, your body mass index should be between 19-25. Your Body mass index is 59.66 kg/m. If this is out of the aformentioned range listed, please consider follow up with your Primary Care Provider.   ________________________________________________________  The [facility] providers would like to encourage you to use MYCHART to communicate with providers for non-urgent requests or questions.  Due to long hold times on the telephone, sending your provider a message by [facility] may be a faster and more efficient way to get a response.  Please allow 48 business hours for a response.  Please remember that this is for non-urgent requests.  _______________________________________________________  [facility] is using a team-based approach to care.  Your team is made up of your doctor and two to three APPS. Our APPS (Nurse Practitioners and Physician Assistants) work with your physician to ensure care continuity for you. They are fully qualified to address your health concerns and develop a treatment plan. They communicate directly with your gastroenterologist to care for you. Seeing the Advanced Practice Practitioners on your physician's team can help you by facilitating care more promptly, often allowing for earlier appointments, access to diagnostic testing, procedures, and other specialty referrals.

## 2024-05-06 ENCOUNTER — Other Ambulatory Visit (HOSPITAL_COMMUNITY): Payer: Self-pay | Admitting: Chiropractic Medicine

## 2024-05-06 ENCOUNTER — Encounter: Payer: Self-pay | Admitting: *Deleted

## 2024-05-06 DIAGNOSIS — M545 Low back pain, unspecified: Secondary | ICD-10-CM

## 2024-05-06 NOTE — Progress Notes (Unsigned)
 Delno Blaisdell                                          MRN: [id number]   05/06/2024   The [facility] Quality Team Specialist reviewed this patient medical record for the purposes of chart review for care gap closure. The following were reviewed: {CHL AMB [facility] QUALITY SPECIALIST REASON FOR REVIEW:[id number]}.    [facility] Quality Team

## 2024-05-19 ENCOUNTER — Other Ambulatory Visit (HOSPITAL_COMMUNITY): Payer: Self-pay | Admitting: Orthopedic Surgery

## 2024-05-19 DIAGNOSIS — M25512 Pain in left shoulder: Secondary | ICD-10-CM

## 2024-06-03 ENCOUNTER — Encounter (HOSPITAL_COMMUNITY): Payer: Self-pay

## 2024-06-03 ENCOUNTER — Ambulatory Visit (HOSPITAL_COMMUNITY)

## 2024-06-03 ENCOUNTER — Ambulatory Visit (HOSPITAL_COMMUNITY): Admission: RE | Admit: 2024-06-03 | Source: Ambulatory Visit

## 2024-06-07 ENCOUNTER — Ambulatory Visit (HOSPITAL_COMMUNITY)
Admission: RE | Admit: 2024-06-07 | Discharge: 2024-06-07 | Attending: Chiropractic Medicine | Admitting: Chiropractic Medicine

## 2024-06-07 ENCOUNTER — Ambulatory Visit (HOSPITAL_COMMUNITY)
Admission: RE | Admit: 2024-06-07 | Discharge: 2024-06-07 | Attending: Orthopedic Surgery | Admitting: Orthopedic Surgery

## 2024-06-07 DIAGNOSIS — M545 Low back pain, unspecified: Secondary | ICD-10-CM | POA: Diagnosis not present

## 2024-06-07 DIAGNOSIS — M25512 Pain in left shoulder: Secondary | ICD-10-CM

## 2024-06-07 DIAGNOSIS — Z472 Encounter for removal of internal fixation device: Secondary | ICD-10-CM | POA: Diagnosis not present

## 2024-06-07 DIAGNOSIS — M48061 Spinal stenosis, lumbar region without neurogenic claudication: Secondary | ICD-10-CM | POA: Diagnosis not present

## 2024-06-07 DIAGNOSIS — M47816 Spondylosis without myelopathy or radiculopathy, lumbar region: Secondary | ICD-10-CM | POA: Diagnosis not present

## 2024-06-07 DIAGNOSIS — M4317 Spondylolisthesis, lumbosacral region: Secondary | ICD-10-CM | POA: Diagnosis not present

## 2024-08-04 ENCOUNTER — Encounter: Payer: Self-pay | Admitting: *Deleted

## 2024-08-04 NOTE — Progress Notes (Signed)
 Sean Murillo                                          MRN: [id number]   08/04/2024   The [facility] Quality Team Specialist reviewed this patient medical record for the purposes of chart review for care gap closure. The following were reviewed: chart review for care gap closure-diabetic eye exam.    [facility] Quality Team
